# Patient Record
Sex: Female | Born: 1970
Health system: Southern US, Community
[De-identification: ages and names within clinical notes are randomized; demographics above are authoritative.]

## PROBLEM LIST (undated history)

## (undated) DIAGNOSIS — F32A Depression, unspecified: Secondary | ICD-10-CM

## (undated) DIAGNOSIS — I5189 Other ill-defined heart diseases: Secondary | ICD-10-CM

## (undated) DIAGNOSIS — Z87442 Personal history of urinary calculi: Secondary | ICD-10-CM

## (undated) DIAGNOSIS — G8929 Other chronic pain: Secondary | ICD-10-CM

## (undated) DIAGNOSIS — T7840XA Allergy, unspecified, initial encounter: Secondary | ICD-10-CM

## (undated) DIAGNOSIS — K219 Gastro-esophageal reflux disease without esophagitis: Secondary | ICD-10-CM

## (undated) DIAGNOSIS — Z87891 Personal history of nicotine dependence: Secondary | ICD-10-CM

## (undated) DIAGNOSIS — R51 Headache: Secondary | ICD-10-CM

## (undated) DIAGNOSIS — E785 Hyperlipidemia, unspecified: Secondary | ICD-10-CM

## (undated) DIAGNOSIS — F329 Major depressive disorder, single episode, unspecified: Secondary | ICD-10-CM

## (undated) DIAGNOSIS — I447 Left bundle-branch block, unspecified: Secondary | ICD-10-CM

## (undated) DIAGNOSIS — I519 Heart disease, unspecified: Secondary | ICD-10-CM

## (undated) DIAGNOSIS — E669 Obesity, unspecified: Secondary | ICD-10-CM

## (undated) DIAGNOSIS — R519 Headache, unspecified: Secondary | ICD-10-CM

## (undated) HISTORY — DX: Major depressive disorder, single episode, unspecified: F32.9

## (undated) HISTORY — DX: Other chronic pain: G89.29

## (undated) HISTORY — DX: Headache: R51

## (undated) HISTORY — DX: Allergy, unspecified, initial encounter: T78.40XA

## (undated) HISTORY — DX: Other ill-defined heart diseases: I51.89

## (undated) HISTORY — DX: Obesity, unspecified: E66.9

## (undated) HISTORY — DX: Left bundle-branch block, unspecified: I44.7

## (undated) HISTORY — DX: Depression, unspecified: F32.A

## (undated) HISTORY — DX: Heart disease, unspecified: I51.9

## (undated) HISTORY — DX: Headache, unspecified: R51.9

## (undated) HISTORY — DX: Hyperlipidemia, unspecified: E78.5

## (undated) HISTORY — DX: Personal history of nicotine dependence: Z87.891

---

## 1998-11-10 ENCOUNTER — Ambulatory Visit (HOSPITAL_COMMUNITY): Admission: RE | Admit: 1998-11-10 | Discharge: 1998-11-10 | Payer: Self-pay | Admitting: Orthopedic Surgery

## 1998-11-10 ENCOUNTER — Encounter: Payer: Self-pay | Admitting: Orthopedic Surgery

## 1998-12-03 ENCOUNTER — Ambulatory Visit (HOSPITAL_BASED_OUTPATIENT_CLINIC_OR_DEPARTMENT_OTHER): Admission: RE | Admit: 1998-12-03 | Discharge: 1998-12-03 | Payer: Self-pay | Admitting: Orthopedic Surgery

## 1999-04-26 ENCOUNTER — Other Ambulatory Visit: Admission: RE | Admit: 1999-04-26 | Discharge: 1999-04-26 | Payer: Self-pay | Admitting: Obstetrics and Gynecology

## 1999-10-19 ENCOUNTER — Ambulatory Visit (HOSPITAL_COMMUNITY): Admission: RE | Admit: 1999-10-19 | Discharge: 1999-10-19 | Payer: Self-pay | Admitting: Obstetrics and Gynecology

## 1999-10-19 ENCOUNTER — Encounter: Payer: Self-pay | Admitting: Obstetrics and Gynecology

## 1999-11-16 ENCOUNTER — Ambulatory Visit (HOSPITAL_COMMUNITY): Admission: RE | Admit: 1999-11-16 | Discharge: 1999-11-16 | Payer: Self-pay | Admitting: Obstetrics and Gynecology

## 1999-11-16 ENCOUNTER — Encounter: Payer: Self-pay | Admitting: Obstetrics and Gynecology

## 1999-12-26 ENCOUNTER — Ambulatory Visit (HOSPITAL_COMMUNITY): Admission: RE | Admit: 1999-12-26 | Discharge: 1999-12-26 | Payer: Self-pay | Admitting: Obstetrics and Gynecology

## 1999-12-28 ENCOUNTER — Ambulatory Visit (HOSPITAL_COMMUNITY): Admission: RE | Admit: 1999-12-28 | Discharge: 1999-12-28 | Payer: Self-pay | Admitting: Obstetrics and Gynecology

## 2000-02-27 ENCOUNTER — Inpatient Hospital Stay (HOSPITAL_COMMUNITY): Admission: AD | Admit: 2000-02-27 | Discharge: 2000-02-27 | Payer: Self-pay | Admitting: Obstetrics and Gynecology

## 2000-02-28 ENCOUNTER — Inpatient Hospital Stay (HOSPITAL_COMMUNITY): Admission: AD | Admit: 2000-02-28 | Discharge: 2000-02-28 | Payer: Self-pay | Admitting: Obstetrics and Gynecology

## 2000-03-06 ENCOUNTER — Inpatient Hospital Stay (HOSPITAL_COMMUNITY): Admission: AD | Admit: 2000-03-06 | Discharge: 2000-03-08 | Payer: Self-pay | Admitting: Obstetrics and Gynecology

## 2001-04-10 ENCOUNTER — Other Ambulatory Visit: Admission: RE | Admit: 2001-04-10 | Discharge: 2001-04-10 | Payer: Self-pay | Admitting: Obstetrics and Gynecology

## 2001-06-07 ENCOUNTER — Encounter: Payer: Self-pay | Admitting: Obstetrics and Gynecology

## 2001-06-07 ENCOUNTER — Ambulatory Visit (HOSPITAL_COMMUNITY): Admission: RE | Admit: 2001-06-07 | Discharge: 2001-06-07 | Payer: Self-pay | Admitting: Obstetrics and Gynecology

## 2001-08-19 ENCOUNTER — Inpatient Hospital Stay (HOSPITAL_COMMUNITY): Admission: AD | Admit: 2001-08-19 | Discharge: 2001-08-19 | Payer: Self-pay | Admitting: Obstetrics and Gynecology

## 2001-08-20 ENCOUNTER — Inpatient Hospital Stay (HOSPITAL_COMMUNITY): Admission: AD | Admit: 2001-08-20 | Discharge: 2001-08-20 | Payer: Self-pay | Admitting: Obstetrics and Gynecology

## 2001-08-29 ENCOUNTER — Inpatient Hospital Stay (HOSPITAL_COMMUNITY): Admission: AD | Admit: 2001-08-29 | Discharge: 2001-08-29 | Payer: Self-pay | Admitting: Obstetrics and Gynecology

## 2001-09-23 ENCOUNTER — Inpatient Hospital Stay (HOSPITAL_COMMUNITY): Admission: AD | Admit: 2001-09-23 | Discharge: 2001-09-23 | Payer: Self-pay | Admitting: Obstetrics and Gynecology

## 2001-10-10 ENCOUNTER — Ambulatory Visit (HOSPITAL_COMMUNITY): Admission: RE | Admit: 2001-10-10 | Discharge: 2001-10-10 | Payer: Self-pay | Admitting: Obstetrics and Gynecology

## 2001-10-10 ENCOUNTER — Encounter: Payer: Self-pay | Admitting: Obstetrics and Gynecology

## 2001-10-11 ENCOUNTER — Inpatient Hospital Stay (HOSPITAL_COMMUNITY): Admission: AD | Admit: 2001-10-11 | Discharge: 2001-10-13 | Payer: Self-pay | Admitting: Obstetrics and Gynecology

## 2001-11-30 HISTORY — PX: WRIST SURGERY: SHX841

## 2002-05-12 ENCOUNTER — Other Ambulatory Visit: Admission: RE | Admit: 2002-05-12 | Discharge: 2002-05-12 | Payer: Self-pay | Admitting: Obstetrics and Gynecology

## 2002-12-04 ENCOUNTER — Encounter: Admission: RE | Admit: 2002-12-04 | Discharge: 2002-12-04 | Payer: Self-pay | Admitting: Orthopaedic Surgery

## 2003-09-10 ENCOUNTER — Encounter: Admission: RE | Admit: 2003-09-10 | Discharge: 2003-09-10 | Payer: Self-pay | Admitting: Orthopaedic Surgery

## 2003-10-23 ENCOUNTER — Other Ambulatory Visit: Admission: RE | Admit: 2003-10-23 | Discharge: 2003-10-23 | Payer: Self-pay | Admitting: Obstetrics and Gynecology

## 2003-12-14 ENCOUNTER — Ambulatory Visit: Payer: Self-pay | Admitting: Family Medicine

## 2004-06-09 ENCOUNTER — Ambulatory Visit: Payer: Self-pay | Admitting: Internal Medicine

## 2005-01-16 ENCOUNTER — Ambulatory Visit: Payer: Self-pay | Admitting: Family Medicine

## 2005-02-07 ENCOUNTER — Ambulatory Visit: Payer: Self-pay | Admitting: Family Medicine

## 2005-10-13 ENCOUNTER — Emergency Department (HOSPITAL_COMMUNITY): Admission: EM | Admit: 2005-10-13 | Discharge: 2005-10-14 | Payer: Self-pay | Admitting: Emergency Medicine

## 2005-10-23 ENCOUNTER — Ambulatory Visit: Payer: Self-pay | Admitting: Gastroenterology

## 2005-11-01 ENCOUNTER — Ambulatory Visit: Payer: Self-pay | Admitting: Gastroenterology

## 2005-11-01 ENCOUNTER — Encounter (INDEPENDENT_AMBULATORY_CARE_PROVIDER_SITE_OTHER): Payer: Self-pay | Admitting: Specialist

## 2005-12-30 HISTORY — PX: FOOT SURGERY: SHX648

## 2006-01-18 ENCOUNTER — Ambulatory Visit: Payer: Self-pay | Admitting: Family Medicine

## 2006-04-13 ENCOUNTER — Ambulatory Visit: Payer: Self-pay | Admitting: Family Medicine

## 2006-06-29 ENCOUNTER — Telehealth: Payer: Self-pay | Admitting: Family Medicine

## 2006-06-29 ENCOUNTER — Emergency Department (HOSPITAL_COMMUNITY): Admission: EM | Admit: 2006-06-29 | Discharge: 2006-06-29 | Payer: Self-pay | Admitting: Emergency Medicine

## 2006-07-02 ENCOUNTER — Telehealth: Payer: Self-pay | Admitting: Family Medicine

## 2006-07-03 ENCOUNTER — Ambulatory Visit: Payer: Self-pay | Admitting: Gastroenterology

## 2006-07-06 ENCOUNTER — Ambulatory Visit (HOSPITAL_COMMUNITY): Admission: RE | Admit: 2006-07-06 | Discharge: 2006-07-06 | Payer: Self-pay | Admitting: Gastroenterology

## 2006-07-12 ENCOUNTER — Encounter: Payer: Self-pay | Admitting: Family Medicine

## 2006-07-18 ENCOUNTER — Encounter (INDEPENDENT_AMBULATORY_CARE_PROVIDER_SITE_OTHER): Payer: Self-pay | Admitting: Surgery

## 2006-07-18 ENCOUNTER — Ambulatory Visit (HOSPITAL_COMMUNITY): Admission: RE | Admit: 2006-07-18 | Discharge: 2006-07-19 | Payer: Self-pay | Admitting: Surgery

## 2006-07-19 ENCOUNTER — Encounter: Payer: Self-pay | Admitting: Family Medicine

## 2007-01-31 HISTORY — PX: CHOLECYSTECTOMY: SHX55

## 2007-03-25 ENCOUNTER — Ambulatory Visit: Payer: Self-pay | Admitting: Family Medicine

## 2007-03-25 DIAGNOSIS — L259 Unspecified contact dermatitis, unspecified cause: Secondary | ICD-10-CM | POA: Insufficient documentation

## 2007-03-25 DIAGNOSIS — K828 Other specified diseases of gallbladder: Secondary | ICD-10-CM | POA: Insufficient documentation

## 2007-03-25 DIAGNOSIS — E7800 Pure hypercholesterolemia, unspecified: Secondary | ICD-10-CM | POA: Insufficient documentation

## 2007-03-25 LAB — CONVERTED CEMR LAB: Rapid Strep: NEGATIVE

## 2007-03-27 ENCOUNTER — Encounter: Payer: Self-pay | Admitting: Family Medicine

## 2007-03-27 ENCOUNTER — Telehealth: Payer: Self-pay | Admitting: Family Medicine

## 2007-03-27 LAB — CONVERTED CEMR LAB
Bilirubin, Direct: 0.1 mg/dL (ref 0.0–0.3)
Cholesterol: 204 mg/dL (ref 0–200)
HDL: 60.8 mg/dL (ref >39.0)
Total Bilirubin: 0.8 mg/dL (ref 0.3–1.2)
Total CHOL/HDL Ratio: 3.4
Total Protein: 6.9 g/dL (ref 6.0–8.3)
Triglycerides: 87 mg/dL (ref 0–149)

## 2007-06-22 DIAGNOSIS — R519 Headache, unspecified: Secondary | ICD-10-CM | POA: Insufficient documentation

## 2007-06-22 DIAGNOSIS — Z87442 Personal history of urinary calculi: Secondary | ICD-10-CM | POA: Insufficient documentation

## 2007-06-22 DIAGNOSIS — E785 Hyperlipidemia, unspecified: Secondary | ICD-10-CM | POA: Insufficient documentation

## 2007-06-22 DIAGNOSIS — R51 Headache: Secondary | ICD-10-CM | POA: Insufficient documentation

## 2008-10-20 ENCOUNTER — Telehealth: Payer: Self-pay | Admitting: Gastroenterology

## 2008-10-20 ENCOUNTER — Ambulatory Visit: Payer: Self-pay | Admitting: Gastroenterology

## 2008-10-20 DIAGNOSIS — F329 Major depressive disorder, single episode, unspecified: Secondary | ICD-10-CM | POA: Insufficient documentation

## 2008-10-29 LAB — CONVERTED CEMR LAB
AST: 20 units/L (ref 0–37)
Albumin: 4 g/dL (ref 3.5–5.2)
Alkaline Phosphatase: 73 units/L (ref 39–117)
BUN: 10 mg/dL (ref 6–23)
Basophils Relative: 5.7 % — ABNORMAL HIGH (ref 0.0–3.0)
Calcium: 9.3 mg/dL (ref 8.4–10.5)
Chloride: 106 meq/L (ref 96–112)
Eosinophils Absolute: 0.1 10*3/uL (ref 0.0–0.7)
Eosinophils Relative: 2.1 % (ref 0.0–5.0)
Lymphocytes Relative: 24.2 % (ref 12.0–46.0)
MCV: 91.5 fL (ref 78.0–100.0)
Monocytes Absolute: 0.4 10*3/uL (ref 0.1–1.0)
Neutrophils Relative %: 61.4 % (ref 43.0–77.0)
Platelets: 297 10*3/uL (ref 150.0–400.0)
Potassium: 3.9 meq/L (ref 3.5–5.1)
RBC: 4.61 M/uL (ref 3.87–5.11)
Sodium: 142 meq/L (ref 135–145)
Total Protein: 7.6 g/dL (ref 6.0–8.3)
WBC: 6.7 10*3/uL (ref 4.5–10.5)

## 2009-01-06 ENCOUNTER — Telehealth: Payer: Self-pay | Admitting: Gastroenterology

## 2009-01-11 ENCOUNTER — Encounter (INDEPENDENT_AMBULATORY_CARE_PROVIDER_SITE_OTHER): Payer: Self-pay | Admitting: *Deleted

## 2009-01-11 ENCOUNTER — Ambulatory Visit: Payer: Self-pay | Admitting: Gastroenterology

## 2009-01-11 DIAGNOSIS — R634 Abnormal weight loss: Secondary | ICD-10-CM | POA: Insufficient documentation

## 2009-01-19 ENCOUNTER — Ambulatory Visit: Payer: Self-pay | Admitting: Gastroenterology

## 2009-01-20 ENCOUNTER — Telehealth (INDEPENDENT_AMBULATORY_CARE_PROVIDER_SITE_OTHER): Payer: Self-pay | Admitting: *Deleted

## 2009-01-21 ENCOUNTER — Ambulatory Visit (HOSPITAL_COMMUNITY): Admission: RE | Admit: 2009-01-21 | Discharge: 2009-01-21 | Payer: Self-pay | Admitting: Gastroenterology

## 2009-01-25 ENCOUNTER — Encounter: Payer: Self-pay | Admitting: Gastroenterology

## 2009-01-26 ENCOUNTER — Encounter: Payer: Self-pay | Admitting: Gastroenterology

## 2009-02-05 ENCOUNTER — Encounter: Payer: Self-pay | Admitting: Gastroenterology

## 2009-04-12 ENCOUNTER — Encounter: Payer: Self-pay | Admitting: Gastroenterology

## 2010-02-16 ENCOUNTER — Encounter: Payer: Self-pay | Admitting: Family Medicine

## 2010-03-01 NOTE — Consult Note (Signed)
Summary: [facility]  [facility]   Imported By: Lester  04/19/2009 08:52:49  _____________________________________________________________________  External Attachment:    Type:   Image     Comment:   External Document

## 2010-03-01 NOTE — Consult Note (Signed)
Summary: [facility]   Imported By: Sherian Rein 02/11/2009 09:22:04  _____________________________________________________________________  External Attachment:    Type:   Image     Comment:   External Document

## 2010-03-02 HISTORY — PX: TONSILLECTOMY: SUR1361

## 2010-03-03 NOTE — Consult Note (Signed)
Summary: [facility]  [facility]   Imported By: Maryln Gottron 02/22/2010 11:09:18  _____________________________________________________________________  External Attachment:    Type:   Image     Comment:   External Document

## 2010-03-16 ENCOUNTER — Encounter: Payer: Self-pay | Admitting: Family Medicine

## 2010-03-16 ENCOUNTER — Ambulatory Visit (INDEPENDENT_AMBULATORY_CARE_PROVIDER_SITE_OTHER): Payer: BC Managed Care – PPO | Admitting: Family Medicine

## 2010-03-16 DIAGNOSIS — J019 Acute sinusitis, unspecified: Secondary | ICD-10-CM

## 2010-03-28 ENCOUNTER — Other Ambulatory Visit: Payer: Self-pay | Admitting: Otolaryngology

## 2010-03-28 ENCOUNTER — Ambulatory Visit (HOSPITAL_BASED_OUTPATIENT_CLINIC_OR_DEPARTMENT_OTHER)
Admission: RE | Admit: 2010-03-28 | Discharge: 2010-03-29 | Disposition: A | Discharge status: Home / Self Care | Payer: BC Managed Care – PPO | Source: Ambulatory Visit | Attending: Otolaryngology | Admitting: Otolaryngology

## 2010-03-28 DIAGNOSIS — F3289 Other specified depressive episodes: Secondary | ICD-10-CM | POA: Insufficient documentation

## 2010-03-28 DIAGNOSIS — F329 Major depressive disorder, single episode, unspecified: Secondary | ICD-10-CM | POA: Insufficient documentation

## 2010-03-28 DIAGNOSIS — J3501 Chronic tonsillitis: Secondary | ICD-10-CM | POA: Insufficient documentation

## 2010-03-29 NOTE — Op Note (Signed)
  Kendra [name], Kendra [name]             ACCOUNT NO.:  [id number]  MEDICAL RECORD NO.:  [id number]            PATIENT TYPE:  LOCATION:                                 FACILITY:  PHYSICIAN:  Illana Nolting H. [name], MD          DATE OF BIRTH:  DATE OF PROCEDURE: DATE OF DISCHARGE:                              OPERATIVE REPORT   REFERRING PHYSICIAN:  Marne A. Tower, MD  PREOPERATIVE DIAGNOSIS:  Chronic tonsillitis.  POSTOPERATIVE DIAGNOSIS:  Chronic tonsillitis.  PROCEDURE:  Tonsillectomy.  SURGEON:  Brantley Naser H. [name], MD  ANESTHESIA:  General endotracheal anesthesia was used.  COMPLICATIONS:  No complications.  ESTIMATED BLOOD LOSS:  Minimal.  FINDINGS:  Large deeply cryptic tonsils with a large white/yellow tonsilliths present on the right side.  HISTORY:  A 40 year old with a history of chronic tonsillitis.  Risks, benefits, alternatives, and complications of the procedure were explained to the patient, seemed to understand, and agreed to surgery.  PROCEDURE:  The patient was taken to the operating room, placed on the operating table in a supine position.  Following induction of general endotracheal anesthesia, the table was turned.  The patient was draped in a standard fashion.  Crowe-Davis mouth gag was inserted into the oral cavity, used to retract the tongue and mandible, and attached to the Mayo stand.  Inspection of the palate revealed no abnormality.  Red rubber catheter was inserted into the right side of the nose, withdrawn through the mouth, and used to retract the soft palate and uvula. Indirect exam of the nasopharynx was performed and there was no adenoidal tissue present.  Tonsillectomy was performed using electrocautery dissection carefully dissecting the avascular plane between the capsule and constrictor muscles. Tonsils were sent together for pathologic evaluation.  Suction cautery was used for completion of hemostasis.  The pharynx was irrigated with saline and  suction.  Orogastric tube was used to aspirate contents of the stomach.  The patient was awakened, extubated, and transferred to recovery in stable condition.     Shamya Macfadden H. [name], MD     JHR/MEDQ  D:  03/28/2010  T:  03/28/2010  Job:  [id number]  cc:   Marne A. Milinda Antis, MD  Electronically Signed by Serena Colonel MD on 03/29/2010 01:30:54 PM

## 2010-03-29 NOTE — Assessment & Plan Note (Signed)
Summary: ?SINUS INFECTION/CLE  [employer]   Vital Signs:  Patient profile:   40 year old female Weight:      193.25 pounds BMI:     27.83 Temp:     98.0 degrees F oral Pulse rate:   74 / minute Pulse rhythm:   regular BP sitting:   136 / 84  (left arm) Cuff size:   large  Vitals Entered By: Selena Batten Dance CMA Duncan Dull) (March 16, 2010 12:50 PM) CC: ? SInus infection   History of Present Illness: symptoms started with a sore throat for about a week with post nasal drip and achey some dizziness and nasal congestion nasal congestion is bad bad facial pain on the right  green discharge  fever at home - chills and sweats   having tonsils out in 2 weeks for chronic sore throat -- 2/27   no n/v/d   Allergies: 1)  ! Penicillin 2)  ! Codeine  Past History:  Past Medical History: Last updated: 03/25/2007 past hx of depression- with medication  Past Surgical History: Last updated: 10/20/2008 CCY 6/08 Wrist surgery (11/2001) Colonoscopy- normal (10/2005) EGD- normal Foot surgery (12/2005) Cholecystectomy  Family History: Last updated: 10/20/2008 mother- chol 2 brothers - chol No FH of Colon Cancer:  Social History: Last updated: 10/20/2008 nonsmoker Occupation: [employer]  Alcohol Use - yes: once a month  Daily Caffeine Use: Sweet tea daily  Illicit Drug Use - no Patient does not get regular exercise.   Risk Factors: Exercise: no (10/20/2008)  Risk Factors: Smoking Status: quit (03/25/2007)  Review of Systems General:  Complains of chills, fatigue, fever, and malaise. Eyes:  Denies blurring, discharge, and eye irritation. ENT:  Complains of earache, nasal congestion, postnasal drainage, sinus pressure, and sore throat. CV:  Denies chest pain or discomfort and palpitations. Resp:  Complains of cough; denies pleuritic and shortness of breath. GI:  Denies abdominal pain, change in bowel habits, indigestion, nausea, and vomiting. Derm:  Denies itching, lesion(s),  poor wound healing, and rash. Neuro:  Denies headaches and tingling. Heme:  Denies abnormal bruising and bleeding.  Physical Exam  General:  Well-developed,well-nourished,in no acute distress; alert,appropriate and cooperative throughout examination Head:  R focal maxillary sinus tenderness normocephalic, atraumatic, and no abnormalities observed.   Eyes:  vision grossly intact, pupils equal, pupils round, pupils reactive to light, and no injection.   Ears:  R ear normal and L ear normal.   Nose:  nares are injected and congested bilaterally  Mouth:  mild post throat inj without exudate or swelling some post nasal drip Neck:  No deformities, masses, or tenderness noted. Lungs:  Normal respiratory effort, chest expands symmetrically. Lungs are clear to auscultation, no crackles or wheezes. Heart:  Normal rate and regular rhythm. S1 and S2 normal without gallop, murmur, click, rub or other extra sounds. Skin:  Intact without suspicious lesions or rashes Cervical Nodes:  No lymphadenopathy noted Psych:  normal affect, talkative and pleasant    Impression & Recommendations:  Problem # 1:  SINUSITIS, ACUTE (ICD-461.9) Assessment New  with uri/ congestion/ focal R facial pain and green nasal d/c tx with levaquin (pcn all)  getting ready for tonsillectomy- hopes to be better by then recommend sympt care- see pt instructions   pt advised to update me if symptoms worsen or do not improve  Her updated medication list for this problem includes:    Levaquin 500 Mg Tabs (Levofloxacin) .Marland Kitchen... 1 by mouth once daily for 10 days for sinus infection  Orders: Prescription Created Electronically [id number])  Complete Medication List: 1)  Wellbutrin 75 Mg Tabs (Bupropion hcl) .... One tablet by mouth two times a day 2)  Levaquin 500 Mg Tabs (Levofloxacin) .Marland Kitchen.. 1 by mouth once daily for 10 days for sinus infection  Patient Instructions: 1)  you can try mucinex over the counter twice daily as directed  and nasal saline spray for congestion (or netti pot) 2)  tylenol over the counter as directed may help with aches, headache and fever 3)  call if symptoms worsen or if not improved in 4-5 days  4)  take the levaquin as directed  5)  get more rest  Prescriptions: LEVAQUIN 500 MG TABS (LEVOFLOXACIN) 1 by mouth once daily for 10 days for sinus infection  #10 x 0   Entered and Authorized by:   Judith Part MD   Signed by:   Judith Part MD on 03/16/2010   Method used:   Electronically to        [facility]* (retail)       [address]       [place], [place]  [address]       Ph: [phone]       Fax: [phone]   RxID:   [id number]    Orders Added: 1)  Prescription Created Electronically [[id number]] 2)  Est. Patient Level III [44010]    Current Allergies (reviewed today): ! PENICILLIN ! CODEINE

## 2010-06-14 NOTE — Assessment & Plan Note (Signed)
[facility]                         GASTROENTEROLOGY OFFICE NOTE   SYRINA, WAKE                    MRN:          [id number]  DATE:07/03/2006                            DOB:          12-25-1970    PRIMARY CARE PHYSICIAN:  Marne A. Tower, M.D.   GI PROBLEM LIST:  1. Intermittent right upper quadrant pain.  Initially evaluated in      September, 2007 by me.  EGD was normal.  CBC was normal.  CMET was      normal.  TSH was normal.  Colonoscopy (done for chronic loose      stools) was also normal.  Ultrasound on September, 2007 showed a      normal gallbladder, normal bile duct.  Patient a no-show for      followup.  Now has a return of right upper quadrant to right back      pain.  Presented to the emergency room last week.  CBC was normal.      Complete metabolic profile was essentially normal.  Ultrasound for      gallbladder was normal as well.  She was told to follow up here.   Her symptoms, she says are very similar to the pain she had previously.  It is a right upper quadrant pain, some tenderness in her right upper  quadrant.  There is a component of some right back pain.  It is nearly  constant, but the pain is definitely worse after eating.  The pain  started shortly after a [employer] meal this past Friday.  She has been  somewhat afraid to eat since then but has been eating cereal without  dramatic worsening of her pain.  She had some mild nausea but no  vomiting.  She does have a history of kidney stones, specifically a  right-sided kidney stone.  She saw a urologist yesterday, who did not  feel this is causing her symptoms.   CURRENT MEDICATIONS:  Wellbutrin and Paxil.   PHYSICAL EXAMINATION:  VITAL SIGNS:  Weight is 202 pounds.  Blood  pressure 134/80, pulse 76.  CONSTITUTIONAL:  Generally well-appearing.  LUNGS: Clear to auscultation bilaterally.  HEART:  Regular rate and rhythm.  ABDOMEN:  Soft.  Mildly tender in the  right upper quadrant.  Positive  Murphy's sign.  EXTREMITIES:  No lower extremity edema.   ASSESSMENT/PLAN:  A 40 year old woman with intermittent right upper  quadrant pain, much worse recently.  She says this is similar to the  episode she had several months ago.  Since that episode, she has been  completely well until acutely this past Friday.  On examination, she has  a Murphy's sign, and I would certainly believe her gallbladder was the  source of her pain were it not for a normal ultrasound and normal liver  tests.  Perhaps her gallbladder does not function well, so I will  arrange for her to have a HIDA scan done in the next 1-2 days.  If that  is normal, my next step would be to repeat the CT scan with IV and oral  contrast, as her  previous CT scan in 2007 was without contrast.  If that  CT scan fails to demonstrate the etiology of her pain, then I  would repeat her upper endoscopy.  It seems unlikely that this is peptic  ulcer disease, given the fact that she takes only rare NSAIDs and an EGD  in September, 2007 was essentially normal.     Rachael Fee, MD  Electronically Signed    DPJ/MedQ  DD: 07/03/2006  DT: 07/04/2006  Job #: [id number]   cc:   Marne A. Milinda Antis, MD

## 2010-06-14 NOTE — Op Note (Signed)
Kendra [name], Kendra [name]             ACCOUNT NO.:  [id number]   MEDICAL RECORD NO.:  [id number]          PATIENT TYPE:  AMB   LOCATION:  SDS                          FACILITY:  [facility]   PHYSICIAN:  Ardeth Sportsman, MD     DATE OF BIRTH:  04/07/1970   DATE OF PROCEDURE:  DATE OF DISCHARGE:                               OPERATIVE REPORT   PRIMARY CARE PHYSICIAN:  Marne A. Tower, M.D.   GASTROENTEROLOGIST:  Rachael Fee, M.D.   UROLOGIST:  Veverly Fells. Vernie Ammons, M.D.   PREOPERATIVE DIAGNOSES:  1. Biliary dyskinesia.  2. Possible chronic cholecystitis.   POSTOPERATIVE DIAGNOSES:  1. Biliary dyskinesia.  2. Possible chronic cholecystitis.   PROCEDURE PERFORMED:  1. Laparoscopic cholecystectomy.  2. Intraoperative cholangiogram.   SURGEON:  Ardeth Sportsman, M.D.   ASSISTANT:  None.   SPECIMENS:  Gallbladder.   DRAINS:  None.   ESTIMATED BLOOD LOSS:  Less than 2 mL.   COMPLICATIONS:  None apparent.   ANESTHESIA:  1. General anesthesia.  2. Local anesthetic and a field block around all port sites.   INDICATION:  Kendra [name] is a very pleasant 40 year old female who has  been struggling with intermittent bouts of abdominal pain with an  extensive workup including urological and gastrointestinal but with a  noted decreased gallbladder ejection fraction with reproduction of  symptoms concerning for biliary dyskinesia.  Given the negative  comprehensive workup, recommendation was made for laparoscopic  cholecystectomy after our discussion of the anatomy and physiology of  hepatobiliary and pancreatic function.   Risks such as stroke, heart attack, deep venous thrombosis, pulmonary  embolism and death were discussed.  Risks such as bleeding, need for  transfusion, wound infection, abscess, injury to other organs, and other  risks were discussed.  Risks of incisions hernia was discussed.  Risk of  bowel duct injury as well as need for operative reconstruction, internal  stenting or external percutaneous drainage.  Other risks were discussed  as well.  Other risks of persistent abdominal pain, arguing that the  gallbladder is not the true etiology of her pain was discussed as well.  Questions were answered, she and her family agreed to proceed.   OPERATIVE FINDINGS:  Normal appearing gallbladder with minimal  gallbladder wall thickening.   DESCRIPTION OF PROCEDURE:  Informed consent was confirmed.  The patient  voided just prior to going to the operating room.  She had sequential  compression devices active during the entire case.  She underwent  general anesthesia without difficulty.  She was positioned supine with  both arms tucked.  Her abdomen was prepped and draped in a sterile  fashion.   Entry was gained in the abdomen.  The patient seated in reverse  Trendelenburg right side up using an optical entry with the 5-mm/0  degree scope.  Capnoperitoneum to 15 mmHg provided good abdominal  insufflation.  There was on evidence of any intraabdominal injury.  Under direct visualization, the 5-mm ports were placed in the right  flank and through the umbilicus.  The 10-mm port was then placed in the  subxiphoid region tunneled  through the falciform ligament.   The gallbladder fundus was grasped and elevated cephalad.  The  peritoneal covering between the gallbladder and liver was freed off in  the anterior-medial and posterior-lateral aspects.  Circumferential  dissection was done to free the proximal half of the gallbladder off the  liver bed and revealed 3 structures going from the gallbladder down to  the porta hepatis.  One was on the anterior medial wall, and pulsatile  consistent with the anterior branch of the cystic artery.  One clip on  the gallbladder side and two clips on the proximal were made.  There was  a smaller posterior branch noted as well and smaller branch on the  posterior lateral wall, again one clip on the gallbladder, and one  clip  slightly proximal were made, and this was transected.  This left one  obvious structure coming from the infundibulum straight down to the  porta hepatis cystic duct.   Clip was made on the infundibulum and partial cyst ductotomy was  performed. A  5-French cholangiogram catheter was placed through a right  subcostal stab incision, flushed and passed into the cystic duct without  any difficulty.  A cholangiogram was run using diluted radiopaque  contrast and continuous fluoroscopy.  Contrast flowed from the side  branch consistent with a cystic duct cannulation.  Contrast flowed into  the main bile duct up the right and left intrahepatic chains, down the  common bile duct, across a somewhat narrow but smooth ampulla into a  normal appearing duodenum consistent with a normal cholangiogram.   Actually the gallbladder freed up from its remaining attachments of the  liver just prior to the cholangiogram.  The cholangiogram catheter was  removed.  Four clips were made in the long cystic duct and cystic duct  transection was completed.  The gallbladder was removed out the  subxiphoid port without any difficulty.  The fascia defect was too small  to allow even my pinky to pass so I felt it did not require any more  aggressive fascial closure.   Irrigation was done on the liver bed and there was excellent hemostasis.  Clips were nice and intact on the cystic and arterial duct stumps.  The  upper 3 abdominal ports were removed.  There was no evidence of any  bleeding on the skin or peritoneal site.  Capnoperitoneum was evacuated.  Skin was closed using 4-0 Monocryl and a sterile dressing was applied.   The patient was extubated and sent to recovery room in stable condition.   I am about to explain the operative findings to the patient's family.  I  discussed the postoperative instructions to the patient just prior to  surgery and with the family just after surgery.  Questions were  answered  and they expressed understanding and appreciation.      Ardeth Sportsman, MD  Electronically Signed     SCG/MEDQ  D:  07/18/2006  T:  07/18/2006  Job:  [id number]   cc:   Marne A. Milinda Antis, MD  Rachael Fee, MD  Veverly Fells. Vernie Ammons, M.D.

## 2010-06-17 NOTE — Discharge Summary (Signed)
NAME:  ZALEY, TALLEY                       ACCOUNT NO.:  [id number]   MEDICAL RECORD NO.:  [id number]                   PATIENT TYPE:  INP   LOCATION:  9143                                 FACILITY:  [facility]   PHYSICIAN:  Zenaida Niece, M.D.             DATE OF BIRTH:  February 26, 1970   DATE OF ADMISSION:  10/11/2001  DATE OF DISCHARGE:  10/13/2001                                 DISCHARGE SUMMARY   ADMISSION DIAGNOSIS:  Intrauterine pregnancy at 36 weeks, Group B strep  carrier.   DISCHARGE DIAGNOSIS:  Intrauterine pregnancy at 36 weeks, Group B strep  carrier.   PROCEDURE:  Spontaneous vaginal delivery.   HISTORY AND PHYSICAL:  This is a 40 year old white female, gravida 2, para  1, 0-0-1, with an EGA of 36+ weeks by seven-week ultrasound with a due date  of October 6th who presents for elective induction with mature fetal lungs  studies by amniocentesis.  The patient is miserable with pre-term  contractions and requested an induction.  Amniocentesis done on September  11th had an FLM of 34 with greater than 55, being consistent with fetal  maturity.  She has had good fetal movement, no vaginal bleeding or ruptured  membranes.  Prenatal care complicated by pre-term contractions treated with  terbutaline and Procardia.  She did have a positive fetal fibronectin at 32  weeks.  She has had mild pregnancy-induced hypertension with normal labs.   PRENATAL LABORATORY VALUES:  Blood type is A negative with a negative  antibody screen.  RPR nonreactive.  Rubella immune.  Hepatitis-B surface  antigen negative.  Gonorrhea and Chlamydia negative.  Triple screen normal.  One-hour Glucola 112.  Group B strep is positive.   PAST OBSTETRIC HISTORY:  February 2002, she had a vaginal delivery at 38  weeks, 7 pounds.  She was induced for PIH.   GYNECOLOGIC HISTORY:  Cryotherapy in 1994 with normal followup.   SURGICAL HISTORY:  Left wrist surgery in 2000.   ALLERGIES:  PENICILLIN and  CODEINE.   PHYSICAL EXAMINATION:  VITAL SIGNS:  She is afebrile with stable vital  signs.  Blood pressure 120/80.  Fetal heart tracing reactive.  ABDOMEN:  Gravida, nontender with __________ weight of 6.5 pounds.  VAGINAL EXAM:  378 minus 1 and minus 2 with a vertex presentation and an  adequate pelvis.   HOSPITAL COURSE:  The patient was admitted and initially started on Pitocin  for induction.  On my first exam as above, amniotomy was performed which  revealed clear fluid.  She was on clindamycin for Group B strep prophylaxis.  She progressed in active labor and received an epidural.  She then  progressed to complete and push well.  On the morning of September 12, she  had a vaginal delivery of a viable female and fit with Apgars of 8 and 9,  weight 7 pounds 10 ounces, over a second-degree midline episiotomy.  Nuchal  cord x2 was reduced.  Placenta delivered spontaneous and was intact.  Second-  degree midline episiotomy was repaired with 3-0 Vicryl.  She had a left  vaginal laceration which was hemostatic and not repaired.  Estimated blood  loss was less than 500 cc.  Postpartum she did very well and breastfed her  baby without complications and remained afebrile.  On the morning of  postpartum day #2, she was felt to be stable enough for discharge home.   DISCHARGE INSTRUCTIONS:  Diet is regular diet.  Activities:  Pelvic rest.  Followup is in 4-6 weeks.  Medications are over-the-counter Motrin p.r.n.  and she was given our discharge pamphlet.                                               Zenaida Niece, M.D.    TDM/MEDQ  D:  10/13/2001  T:  10/13/2001  Job:  [id number]

## 2010-06-17 NOTE — Discharge Summary (Signed)
[facility]  Patient:    Kendra [name], Kendra [name]                    MRN: [id number] Adm. Date:  [id number] Disc. Date: 03/08/00 Attending:  Michaele Offer                           Discharge Summary  DISCHARGE DIAGNOSES:          1. Term pregnancy at 38 weeks, delivered.                               2. Pregnancy induced hypertension.                               3. Group B streptococcus carrier.                               4. Status post normal spontaneous vaginal                                  delivery.  DISCHARGE MEDICATIONS:        1. Motrin 600 mg p.o. every six hours.                               2. Percocet one to two tablets p.o. every four                                  hours p.r.n.  HOSPITAL FOLLOWUP:            The patient is to follow up in six weeks for her routine postpartum exam.  HOSPITAL COURSE:              The patient is a 40 year old, G1, P0, who was was admitted at 38 weeks by her last menstrual period, consistent with a 18-week ultrasound. She presented for induction given pregnancy induced hypertension with blood pressures noted to be 130s/100s in the office. Prenatal care had been complicated only by the increased blood pressures with PIH labs normal and Rh negative status, for which she received RhoGAM.  PRENATAL LABORATORIES:        A negative. RPR nonreactive. Rubella immune. Hepatitis B surface antigen negative. GC negative. Chlamydia negative. AFP normal. GBS positive. She did have a positive one-hour glucola; however, her three-hour was within normal limits.  PAST GYNECOLOGIC HISTORY:     In 1994 she had cryosurgery with Pap smears following within normal limits.  PAST MEDICAL HISTORY:         Questionable history of a spastic colon.  PAST SURGICAL HISTORY:        Left wrist surgery.  ALLERGIES:                    PENICILLIN and CODEINE.  SOCIAL HISTORY:               She is married and quit smoking  with pregnancy.  PHYSICAL EXAMINATION ON ADMISSION:                 Blood  pressure is 120-140/70-100. Fetal heart rate was reactive with no decelerations and she was begun on Pitocin. Abdomen is gravid. EFW 7-1/2  pounds. Cervix was 1/90/-1.  The patient had assisted rupture of membranes with clear fluid. PIH labs were repeated and normal except for a uric acid of 5.7, which was slightly elevated. She was placed on clindamycin for GBS prophylaxis and continued on Pitocin. The patient received an epidural at 3 cm dilated and progressed quickly to complete. She pushed well; however, became fatigued and requested assisted delivery. A vacuum was attempted but unable to get an adequate seal. Therefore, the patient proceeded to push and deliver spontaneously. There was a normal spontaneous delivery of a viable female infant. Apgars were 8 and 9. Weight was 7 pounds even. There was a second-degree midline episiotomy with a local block. There was a loose nuchal cord x 1, reduced. The placenta was delivered spontaneous. Second-degree midline episiotomy was repaired with a 3-0 Vicryl. Cervix and rectum were intact and the estimated blood loss was less than 500 cc. The patient was then admitted for routine postpartum care and did well.  On postpartum day #2 she was afebrile with stable vital signs. Her pain was well controlled. She had normal lochia and was breast-feeding. The patient was felt stable for discharge and was discharged to home with her follow up and medications as previously stated. DD:  03/08/00 TD:  03/09/00 Job: [id number]

## 2010-06-17 NOTE — Assessment & Plan Note (Signed)
[facility]                           GASTROENTEROLOGY OFFICE NOTE   THEODOSIA, BAHENA                    MRN:          [id number]  DATE:10/23/2005                            DOB:          [birth year]    REASON FOR REFERRAL:  Dr. Milinda Antis asked me to evaluate Ms. Jenifer in  consultation regarding subacute abdominal pain.   HISTORY OF PRESENT ILLNESS:  Ms. Brusseau is a very pleasant 40 year old  woman who noticed acute onset of abdominal discomfort beginning  approximately 10 days ago.  It started with bloating in her abdomen and then  slowly changed to pain in her midepigastrium that seemed to radiate to her  back causing a gnawing pain in her back.  She had never had pain like this.  The pain got worse and worse.  She eventually presented to urgent care where  x-rays were done and lab tests were done.  X-rays suggested a left-sided  kidney stone.  Labs done at that time showed completely normal basic  metabolic profile and a normal CBC.  She was given a GI cocktail.  Things  did seem to improve.  They attributed her pain to the kidney stone.  A day  or 2 later the bloating and pain in her back and abdomen seemed to stay and  actually got worse, so she re-presented to the emergency room.  At that  point she had a CT scan without IV contrast, which confirmed the stones in  her left kidney.  There was no evidence of hydronephrosis.  She was followed  up with a urologist, who did not think these stones were at all causing her  discomfort.  She comes in now after 10 days of her symptoms.  The pain is  somewhat improved.  She has been taking Advil 3 pills twice a day and Ultram  periodically.  She had very mild nausea, but no vomiting.  She does have  diarrhea.  This has been going on for many years for her.  She has what  sounds like a brisk gastrocolic reflex moving her bowels very loose at least  3-4 times a day.  She does describe a small bit of  blood on the tissue paper  with just about every bowel movement.  She did not think much of this  because it has been going on for such a long time.   REVIEW OF SYSTEMS:  Notable for recent weight loss followed by a 20 pound  weight gain within the past year.  Overall, she is approximately stable for  the past year.  The rest of the review of systems essentially normal and is  available on her nursing intake sheet.   PAST MEDICAL HISTORY:  Elevated cholesterol.  Kidney stones.  Chronic  headaches.   CURRENT MEDICATIONS:  Advil on a p.r.n. basis, which she is needing a lot of  lately.  Paxil.   ALLERGIES:  PENICILLIN and CODEINE.   SOCIAL HISTORY:  Married with 2 sons.  Works at [employer].  Smokes  occasionally.  Drinks very rarely.   FAMILY HISTORY:  No colon cancer,  colon polyps in family.  No pancreatic  disease in family.   PHYSICAL EXAM:  Height 5 feet, 10 inches, 204 pounds.  Blood pressure  122/86, pulse 60.  CONSTITUTIONAL:  Generally well appearing.  NEUROLOGIC:  Alert and oriented x3.  EYES:  Extraocular muscles are intact.  MOUTH:  Oropharynx moist.  No lesions.  NECK:  Supple.  No lymphadenopathy.  CARDIOVASCULAR:  Heart regular rate and rhythm.  LUNGS:  Clear to auscultation bilaterally.  ABDOMEN:  Soft.  Very mildly tender in the upper quadrants.  Nondistended.  Normal bowel sounds.  EXTREMITIES:  No lower extremity edema.  SKIN:  No rash or lesions on the visible extremities.   ASSESSMENT AND PLAN:  A 40 year old woman with subacute abdominal pain,  chronic diarrhea with nearly daily blood on tissue paper.   First, it is not clear that her chronic diarrhea is related to her subacute  abdominal pains, but I believe both should be worked up.  I agree that her  pains are unlikely related to those kidney stones.  She may have peptic  ulcer disease, gastritis.  There is some component of back pain, which  raises the question of pancreatic disease, like pancreatitis.   The CT scan  that she had done was suboptimal in terms of evaluating the pancreas and  gallbladder.  I have arranged for her to have an ultrasound done for further  evaluation.  I am also going to repeat lab tests including a CMP, CBC,  thyroid studies, TTG, amylase and lipase.  Have advised her to stay off of  dairy for the time-being.  Perhaps she has a component of lactose  intolerance causing some of her diarrhea.  To further workup her diarrhea,  she will get stool studies done and I am arranging for her to have a  colonoscopy, as well as upper endoscopy at her soonest convenience.                                   Rachael Fee, MD   DPJ/MedQ  DD:  10/23/2005  DT:  10/24/2005  Job #:  [id number]   cc:   Marne A. Milinda Antis, MD  Courtney Paris, M.D.

## 2010-09-07 ENCOUNTER — Encounter: Payer: Self-pay | Admitting: Family Medicine

## 2010-09-10 ENCOUNTER — Telehealth: Payer: Self-pay | Admitting: Family Medicine

## 2010-09-10 DIAGNOSIS — E7800 Pure hypercholesterolemia, unspecified: Secondary | ICD-10-CM

## 2010-09-10 DIAGNOSIS — Z0000 Encounter for general adult medical examination without abnormal findings: Secondary | ICD-10-CM | POA: Insufficient documentation

## 2010-09-10 NOTE — Telephone Encounter (Signed)
Message copied by Judy Pimple on Sat Sep 10, 2010  2:45 PM ------      Message from: Baldomero Lamy      Created: Wed Sep 07, 2010  8:56 AM      Regarding: Cpx labs Mon 8/13       Please order  future cpx labs for pt's upcomming lab appt.      Thanks      Rodney Booze

## 2010-09-12 ENCOUNTER — Other Ambulatory Visit (INDEPENDENT_AMBULATORY_CARE_PROVIDER_SITE_OTHER): Payer: BC Managed Care – PPO | Admitting: Family Medicine

## 2010-09-12 DIAGNOSIS — Z0000 Encounter for general adult medical examination without abnormal findings: Secondary | ICD-10-CM

## 2010-09-12 DIAGNOSIS — E7800 Pure hypercholesterolemia, unspecified: Secondary | ICD-10-CM

## 2010-09-12 LAB — CBC WITH DIFFERENTIAL/PLATELET
Basophils Absolute: 0 10*3/uL (ref 0.0–0.1)
Eosinophils Absolute: 0.2 10*3/uL (ref 0.0–0.7)
HCT: 39 % (ref 36.0–46.0)
Hemoglobin: 13 g/dL (ref 12.0–15.0)
Lymphs Abs: 2 10*3/uL (ref 0.7–4.0)
MCHC: 33.4 g/dL (ref 30.0–36.0)
MCV: 91.8 fl (ref 78.0–100.0)
Monocytes Absolute: 0.4 10*3/uL (ref 0.1–1.0)
Neutro Abs: 3.5 10*3/uL (ref 1.4–7.7)
Platelets: 250 10*3/uL (ref 150.0–400.0)
RDW: 13.4 % (ref 11.5–14.6)

## 2010-09-12 LAB — COMPREHENSIVE METABOLIC PANEL
ALT: 25 U/L (ref 0–35)
AST: 21 U/L (ref 0–37)
Alkaline Phosphatase: 52 U/L (ref 39–117)
CO2: 26 mEq/L (ref 19–32)
Creatinine, Ser: 0.8 mg/dL (ref 0.4–1.2)
GFR: 82.35 mL/min (ref >60.00)
Sodium: 136 mEq/L (ref 135–145)
Total Bilirubin: 0.5 mg/dL (ref 0.3–1.2)
Total Protein: 6.8 g/dL (ref 6.0–8.3)

## 2010-09-12 LAB — LIPID PANEL
HDL: 61.9 mg/dL (ref >39.00)
Total CHOL/HDL Ratio: 3
Triglycerides: 77 mg/dL (ref 0.0–149.0)
VLDL: 15.4 mg/dL (ref 0.0–40.0)

## 2010-09-14 ENCOUNTER — Encounter: Payer: Self-pay | Admitting: Family Medicine

## 2010-09-14 ENCOUNTER — Ambulatory Visit (INDEPENDENT_AMBULATORY_CARE_PROVIDER_SITE_OTHER): Payer: BC Managed Care – PPO | Admitting: Family Medicine

## 2010-09-14 DIAGNOSIS — F3289 Other specified depressive episodes: Secondary | ICD-10-CM

## 2010-09-14 DIAGNOSIS — E785 Hyperlipidemia, unspecified: Secondary | ICD-10-CM

## 2010-09-14 DIAGNOSIS — F329 Major depressive disorder, single episode, unspecified: Secondary | ICD-10-CM

## 2010-09-14 DIAGNOSIS — Z0000 Encounter for general adult medical examination without abnormal findings: Secondary | ICD-10-CM

## 2010-09-14 DIAGNOSIS — Z23 Encounter for immunization: Secondary | ICD-10-CM

## 2010-09-14 MED ORDER — BUPROPION HCL ER (SR) 150 MG PO TB12: 150.0000 mg | ORAL_TABLET | Freq: Two times a day (BID) | ORAL | Status: DC

## 2010-09-14 NOTE — Assessment & Plan Note (Signed)
Reviewed health habits including diet and exercise and skin cancer prevention Also reviewed health mt list, fam hx and immunizations  Rev wellness labs in detail 

## 2010-09-14 NOTE — Progress Notes (Signed)
Subjective:    Patient ID: Kendra [name], female    DOB: 1970-06-04, 40 y.o.   MRN: [id number]  HPI Here for annual wellness exam and to review chronic health problems Has been feeling ok in general   Wt is up 4 lb with bmi of 29  Ht is down 1 inch- but measuring device is new   Gets yearly gyn visits-- was sched for June and had to resched it  Pap- is sched next mo  Has IUD and likes it - no mt  No period problems -still has regular periods  Has not had mammogram yet   Has a kidney stone - one in each kidney - quite large Had considered lithotripsy  She has pain occasionally - on and off - but not bad enough to do anything about it  occ has pain in her R shoulderblade  Had ccy  occ pain in LUQ of abdomen - no heartburn or burping or bloating  Has had 2 EGD - and showed GERd - is supposed to take prilosec but she does not    Tdap-- ? Last one was in college  Is interested  Nl colonosc in 10/07 and nl flex 12/10- never found polyps   Depression - is fine overall - wellbutrin helps her moods and anxiety - needs refil on wellbutrin  Gyn used to give to her  Also tends to help her headaches    Hyperlipidemia --LDL calc is 123 Lab Results  Component Value Date   CHOL 200 09/12/2010   CHOL 204* 03/25/2007   Lab Results  Component Value Date   HDL 61.90 09/12/2010   HDL 56.2 03/25/2007   Lab Results  Component Value Date   LDLCALC 123* 09/12/2010   Lab Results  Component Value Date   TRIG 77.0 09/12/2010   TRIG 87 03/25/2007   Lab Results  Component Value Date   CHOLHDL 3 09/12/2010   CHOLHDL 3.4 CALC 03/25/2007   Lab Results  Component Value Date   LDLDIRECT 135.6 03/25/2007   has had higher chol in past  Trying harder with diet in the past few years  Stays away from fried foods   Patient Active Problem List  Diagnoses  . HYPERLIPIDEMIA  . DEPRESSION  . ECZEMA  . WEIGHT LOSS-ABNORMAL  . HEADACHE, CHRONIC  . NEPHROLITHIASIS, HX OF  . Routine general  medical examination at a health care facility   Past Medical History  Diagnosis Date  . Depression   . Hyperlipidemia   . Chronic headache   . Kidney stones   . Former smoker     quit in 46   Past Surgical History  Procedure Date  . Wrist surgery 11/2001  . Foot surgery 12/2005  . Cholecystectomy   . Tonsillectomy 03/2010   History  Substance Use Topics  . Smoking status: Former Smoker    Quit date: 01/31/1991  . Smokeless tobacco: Not on file  . Alcohol Use: Yes   Family History  Problem Relation Age of Onset  . Hyperlipidemia Mother   . Hyperlipidemia Brother   . Hyperlipidemia Brother    Allergies  Allergen Reactions  . Codeine     REACTION: hives  . Penicillins     REACTION: hives   No current outpatient prescriptions on file prior to visit.      Review of Systems Review of Systems  Constitutional: Negative for fever, appetite change, fatigue and unexpected weight change.  Eyes: Negative for pain and visual disturbance.  Respiratory: Negative for cough and shortness of breath.   Cardiovascular: Negative.for cp or palpitations   Gastrointestinal: Negative for nausea, diarrhea and constipation.  Genitourinary: Negative for urgency and frequency.  Skin: Negative for pallor. and rash  Neurological: Negative for weakness, light-headedness, numbness and pos for some headaches Hematological: Negative for adenopathy. Does not bruise/bleed easily.  Psychiatric/Behavioral: Negative for dysphoric mood when she is on her medicine. The patient is not nervous/anxious.          Objective:   Physical Exam  Constitutional: She appears well-developed and well-nourished. No distress.  HENT:  Head: Normocephalic and atraumatic.  Right Ear: External ear normal.  Left Ear: External ear normal.  Nose: Nose normal.  Mouth/Throat: Oropharynx is clear and moist.  Eyes: Conjunctivae and EOM are normal. Pupils are equal, round, and reactive to light.  Neck: Normal range of  motion. Neck supple. No JVD present. Carotid bruit is not present. No thyromegaly present.  Cardiovascular: Normal rate, regular rhythm, normal heart sounds and intact distal pulses.   Pulmonary/Chest: Effort normal and breath sounds normal. No respiratory distress. She has no wheezes.  Abdominal: Soft. Bowel sounds are normal. She exhibits no distension and no mass. There is no tenderness.  Musculoskeletal: Normal range of motion. She exhibits no edema and no tenderness.       No cva tenderness  Lymphadenopathy:    She has no cervical adenopathy.  Neurological: She is alert. She has normal reflexes. No cranial nerve deficit. Coordination normal.  Skin: Skin is warm and dry. No rash noted. No erythema. No pallor.  Psychiatric: She has a normal mood and affect.       Cheerful and talkative  Nl eye contact and comm skills          Assessment & Plan:

## 2010-09-14 NOTE — Assessment & Plan Note (Signed)
This has improved wth better diet  Rev lab with pt  Rev goals as well  Enc low sat fat diet and exercise

## 2010-09-14 NOTE — Patient Instructions (Signed)
Keep up the healthy diet and exercise Keep working on low sat fat diet (Avoid red meat/ fried foods/ egg yolks/ fatty breakfast meats/ butter, cheese and high fat dairy/ and shellfish  ) Drink lots of water in the summer  Tdap vaccine today for tetnus and pertussis

## 2010-09-15 NOTE — Assessment & Plan Note (Signed)
Does well with wellbutrin and this also helps with headaches Will refill this

## 2010-09-16 ENCOUNTER — Telehealth: Payer: Self-pay | Admitting: *Deleted

## 2010-09-16 DIAGNOSIS — Z114 Encounter for screening for human immunodeficiency virus [HIV]: Secondary | ICD-10-CM

## 2010-09-16 NOTE — Telephone Encounter (Signed)
I do not know how many days out it would be good or if right tube was used...will have to find out from the lab Also she may have to sign a release to do that When you find out -just let me know if I need to order it -thanks

## 2010-09-16 NOTE — Telephone Encounter (Signed)
Spoke with Camelia Eng and the appropriate tube was not drawn and there is a form to sign. I contacted pt and notified her and she scheduled lab appt for 09/21/10 Wednesday at 9:10am. Please order test. Thank you.

## 2010-09-16 NOTE — Telephone Encounter (Signed)
The patient says she was seen earlier in the week and had some lab tests.  She is asking if she could have an HIV added to this lab draw?  I was not able to reach Terri or Rodney Booze to see if this is even possible because of the lapse of time.  Please advise pt.

## 2010-09-16 NOTE — Telephone Encounter (Signed)
I ordered it for future lab

## 2010-09-21 ENCOUNTER — Other Ambulatory Visit (INDEPENDENT_AMBULATORY_CARE_PROVIDER_SITE_OTHER): Payer: BC Managed Care – PPO

## 2010-09-21 DIAGNOSIS — Z1159 Encounter for screening for other viral diseases: Secondary | ICD-10-CM

## 2010-09-21 DIAGNOSIS — Z114 Encounter for screening for human immunodeficiency virus [HIV]: Secondary | ICD-10-CM

## 2010-09-21 NOTE — Progress Notes (Signed)
Addended by: Alvina Chou on: 09/21/2010 10:39 AM   Modules accepted: Orders

## 2010-09-23 ENCOUNTER — Telehealth: Payer: Self-pay | Admitting: Family Medicine

## 2010-09-28 ENCOUNTER — Other Ambulatory Visit: Payer: Self-pay | Admitting: Dermatology

## 2010-11-16 LAB — CBC
Hemoglobin: 12.9
MCHC: 33.7
Platelets: 317
RDW: 13

## 2011-03-15 ENCOUNTER — Encounter: Payer: Self-pay | Admitting: Internal Medicine

## 2011-03-15 ENCOUNTER — Ambulatory Visit (INDEPENDENT_AMBULATORY_CARE_PROVIDER_SITE_OTHER)
Admission: RE | Admit: 2011-03-15 | Discharge: 2011-03-15 | Disposition: A | Discharge status: Home / Self Care | Payer: 59 | Source: Ambulatory Visit | Attending: Internal Medicine | Admitting: Internal Medicine

## 2011-03-15 ENCOUNTER — Ambulatory Visit (INDEPENDENT_AMBULATORY_CARE_PROVIDER_SITE_OTHER): Payer: 59 | Admitting: Internal Medicine

## 2011-03-15 VITALS — BP 140/90 | HR 88 | Temp 98.3°F | Wt 205.0 lb

## 2011-03-15 DIAGNOSIS — N390 Urinary tract infection, site not specified: Secondary | ICD-10-CM | POA: Insufficient documentation

## 2011-03-15 DIAGNOSIS — N201 Calculus of ureter: Secondary | ICD-10-CM | POA: Insufficient documentation

## 2011-03-15 DIAGNOSIS — R319 Hematuria, unspecified: Secondary | ICD-10-CM

## 2011-03-15 LAB — POCT URINALYSIS DIPSTICK
Nitrite, UA: NEGATIVE
Protein, UA: NEGATIVE
Spec Grav, UA: 1.02
Urobilinogen, UA: NEGATIVE

## 2011-03-15 MED ORDER — HYDROCODONE-ACETAMINOPHEN 5-325 MG PO TABS: 1.0000 | ORAL_TABLET | ORAL | Status: AC | PRN

## 2011-03-15 MED ORDER — CIPROFLOXACIN HCL 500 MG PO TABS: 500.0000 mg | ORAL_TABLET | Freq: Two times a day (BID) | ORAL | Status: AC

## 2011-03-15 NOTE — Progress Notes (Signed)
  Subjective:    Patient ID: Kendra [name], female    DOB: [birth year], 41 y.o.   MRN: [id number]  HPI Has been having pain for 2.5 weeks and worse in last 4 days Left flank and lower abdomen---up to scapula Mostly dull but occ sharp  Some pink, blood, in urine Now with dysuria since yesterday No fever  No N/V Eating is off some  Ibuprofen no help for pain No procedure on stones in past----lithotripsy was suggested  Current Outpatient Prescriptions on File Prior to Visit  Medication Sig Dispense Refill  . Multiple Vitamin (MULTIVITAMIN) tablet Take 1 tablet by mouth daily.          Allergies  Allergen Reactions  . Codeine     REACTION: hives  . Penicillins     REACTION: hives    Past Medical History  Diagnosis Date  . Depression   . Hyperlipidemia   . Chronic headache   . Kidney stones   . Former smoker     quit in 63    Past Surgical History  Procedure Date  . Wrist surgery 11/2001  . Foot surgery 12/2005  . Cholecystectomy   . Tonsillectomy 03/2010    Family History  Problem Relation Age of Onset  . Hyperlipidemia Mother   . Hyperlipidemia Brother   . Hyperlipidemia Brother     History   Social History  . Marital Status: Legally Separated    Spouse Name: N/A    Number of Children: N/A  . Years of Education: N/A   Occupational History  . [employer]    Social History Main Topics  . Smoking status: Former Smoker    Quit date: 01/31/1991  . Smokeless tobacco: Never Used  . Alcohol Use: Yes  . Drug Use: No  . Sexually Active: Not on file   Other Topics Concern  . Not on file   Social History Narrative   Daily caffeine use:  Sweet Tea dailyPatient does not get regular exercise   Review of Systems Recent cold which has improved No cough No SOB     Objective:   Physical Exam  Constitutional: She appears well-developed and well-nourished. No distress.  Abdominal: Soft. She exhibits no mass. There is no tenderness.    Musculoskeletal:       Distinct left CVA tenderness          Assessment & Plan:

## 2011-03-15 NOTE — Assessment & Plan Note (Signed)
Has had symptoms suggestive of change in stone position  Now with secondary infection norco for pain Check CT to be sure no hydronephrosis

## 2011-03-15 NOTE — Patient Instructions (Signed)
Please set up the CT scan

## 2011-03-15 NOTE — Assessment & Plan Note (Signed)
Has dysuria and abnormal urine---hematuria and leukocytes May be secondary to stone Will check culture No systemic symptoms Will treat with cipro for now

## 2011-03-18 LAB — URINE CULTURE: Colony Count: 80000

## 2011-03-21 ENCOUNTER — Telehealth: Payer: Self-pay

## 2011-03-21 NOTE — Telephone Encounter (Signed)
I am puzzled by persistence of pain - though glad her urinary pain is better - I want to see her - please put her in tomorrow (a blocked slot is ok if none are open)

## 2011-03-21 NOTE — Telephone Encounter (Signed)
Pt said the pain is the same at the left side of back under ribcage. The pain upon urination is gone. Pt cannot take Hydrocodone during the day. No fever and no new symptoms. Pt uses [facility] in [place] if pharmacy needed.Please advise.

## 2011-03-21 NOTE — Telephone Encounter (Signed)
Patient was calling regarding her urine culture results and if her antibiotic needed to be changed.  She says she is still in a lot of pain.  She did have the CT scan done and it was clear.  She has the Hydrocodone still, but doesn't really like taking it.  She wanted to know what to do next.

## 2011-03-21 NOTE — Telephone Encounter (Signed)
I reviewed chart - CT ok , and urine cx shows that cipro is a good drug for the uti -- where is she still having pain ? / any change at all? Fever or new symptoms?

## 2011-03-21 NOTE — Telephone Encounter (Signed)
Patient notified as instructed by telephone. Pt scheduled appt tomorrow at 8:15 to see Dr Milinda Antis.

## 2011-03-22 ENCOUNTER — Ambulatory Visit: Payer: 59 | Admitting: Family Medicine

## 2011-03-22 DIAGNOSIS — Z0289 Encounter for other administrative examinations: Secondary | ICD-10-CM

## 2011-03-29 ENCOUNTER — Ambulatory Visit: Payer: 59 | Admitting: Family Medicine

## 2011-04-04 ENCOUNTER — Telehealth: Payer: Self-pay | Admitting: *Deleted

## 2011-04-04 NOTE — Telephone Encounter (Signed)
Call from Dr. Hilario Quarry office at [facility] asking for pt's urine culture results from 03/15/11, results faxed to [phone].

## 2011-04-06 ENCOUNTER — Telehealth: Payer: Self-pay | Admitting: *Deleted

## 2011-04-06 NOTE — Telephone Encounter (Signed)
Patient called back stating that she was given an appt tomorrow with Dr. Sharen Hones and was concerned because she didn't know whether to keep the appt in the office for tomorrow or go to the ER tonight.  I spoke with Dr. Sharen Hones and was advised to tell patient that if her pain is unchanged she can wait to see him in the office tomorrow, but if her pain has changed/worsened then she needs to be seen in the ER today.  Patient stated that her pain hasn't changed and she will come in for her appt tomorrow.

## 2011-04-06 NOTE — Telephone Encounter (Signed)
Patient called in a panic stating that she just left the Urologist [facility]) and was advised that she may possibly have a blood clot.  She stated that the nurse told her that after patient described her symptoms and her pain, they think it could be related to a blood clot.  Patient stated that she is suppose to fly on Wednesday of next week.  I advised patient that Dr. Milinda Antis is out of the office today and will not return until Monday.  Patient stated that she is in pain but doesn't know what to do.  I advised patient that it's best that she does go to the ER to be evaluated to make sure that their is not blood clot, advised patient that she will be evaluated and treated accordingly in the ER.

## 2011-04-06 NOTE — Telephone Encounter (Signed)
Noted. Agree. 1+ mo h/o same pain - will see tomorrow.

## 2011-04-07 ENCOUNTER — Encounter: Payer: Self-pay | Admitting: Family Medicine

## 2011-04-07 ENCOUNTER — Ambulatory Visit (INDEPENDENT_AMBULATORY_CARE_PROVIDER_SITE_OTHER): Payer: 59 | Admitting: Family Medicine

## 2011-04-07 ENCOUNTER — Ambulatory Visit (INDEPENDENT_AMBULATORY_CARE_PROVIDER_SITE_OTHER)
Admission: RE | Admit: 2011-04-07 | Discharge: 2011-04-07 | Disposition: A | Discharge status: Home / Self Care | Payer: 59 | Source: Ambulatory Visit | Attending: Family Medicine | Admitting: Family Medicine

## 2011-04-07 ENCOUNTER — Telehealth: Payer: Self-pay | Admitting: *Deleted

## 2011-04-07 VITALS — BP 150/90 | HR 113 | Temp 98.3°F | Ht 70.0 in | Wt 206.1 lb

## 2011-04-07 DIAGNOSIS — R0781 Pleurodynia: Secondary | ICD-10-CM

## 2011-04-07 DIAGNOSIS — R9431 Abnormal electrocardiogram [ECG] [EKG]: Secondary | ICD-10-CM

## 2011-04-07 DIAGNOSIS — R071 Chest pain on breathing: Secondary | ICD-10-CM

## 2011-04-07 LAB — CBC WITH DIFFERENTIAL/PLATELET
Basophils Relative: 0.7 % (ref 0.0–3.0)
Eosinophils Relative: 1.5 % (ref 0.0–5.0)
Lymphocytes Relative: 32.8 % (ref 12.0–46.0)
MCV: 91 fl (ref 78.0–100.0)
Monocytes Absolute: 0.4 10*3/uL (ref 0.1–1.0)
Monocytes Relative: 5.9 % (ref 3.0–12.0)
Neutrophils Relative %: 59.1 % (ref 43.0–77.0)
Platelets: 317 10*3/uL (ref 150.0–400.0)
RBC: 4.9 Mil/uL (ref 3.87–5.11)
WBC: 7.6 10*3/uL (ref 4.5–10.5)

## 2011-04-07 LAB — BASIC METABOLIC PANEL
Calcium: 9.6 mg/dL (ref 8.4–10.5)
GFR: 84.49 mL/min (ref >60.00)
Glucose, Bld: 100 mg/dL — ABNORMAL HIGH (ref 70–99)
Potassium: 4.5 mEq/L (ref 3.5–5.1)
Sodium: 138 mEq/L (ref 135–145)

## 2011-04-07 LAB — D-DIMER, QUANTITATIVE: D-Dimer, Quant: 0.23 ug/mL-FEU (ref 0.00–0.48)

## 2011-04-07 LAB — TSH: TSH: 0.87 u[IU]/mL (ref 0.35–5.50)

## 2011-04-07 MED ORDER — CYCLOBENZAPRINE HCL 10 MG PO TABS: 10.0000 mg | ORAL_TABLET | Freq: Two times a day (BID) | ORAL | Status: AC | PRN

## 2011-04-07 NOTE — Telephone Encounter (Signed)
Spoke with pt, discussed normal blood work results (normal d dimer and others.) rec trial flexeril over weekend, await [place] results on Tuesday.

## 2011-04-07 NOTE — Patient Instructions (Addendum)
I want to set you up with heart ultrasound - pass by Marion's office to schedule this. Blood work today to check on things - we will be in touch about results and next step. May still try flexeril for muscle pain. [phone]

## 2011-04-07 NOTE — Progress Notes (Signed)
Subjective:    Patient ID: Kendra [name], female    DOB: 1970/09/25, 41 y.o.   MRN: [id number]  HPI CC: f/u uro  41 yo with h/o nephrolithiasis, asxs GERD (controlled on prilosec) presents for f/u after eval at urology.  Anxious because told may have a blood clot.  Seen here 03/16/2011 with concern for kidney stones and UTI, treated with 10d course cipro and sent to urology.  eval at urology - did not think pain was due to kidney stone, wondered about PE, advised to f/u with PCP so here today for this.  CT scan: 9 mm left lower pole nonobstructing renal calculus. Additional adjacent punctate calculus. No right renal calculi. No hydronephrosis.  IUD in place.  O/w stable.  Describes 1 month history of pain that starts under ribcage and radiates to left flank and left shoulder blade.  Currently pain in anterior left chest wall.  Constant dull pain, sometimes sharp, worse with deep breaths.  At its worst 8/10 pain, at its best 5/10, currently 7/10.  Pleuritic anterior chest and shoulder blade as well.  Trouble getting comfortable.  + SOB.  Deep breaths make this worse.  No movements make it worse.  Leaning forward doesn't help.  Laying down makes it some better.  Hydrocodone helps pain but knocks her out.  Trouble with NSAIDs 2/2 upset stomach.  Having business trip next week to [place].  Wants things checked out before trip.  UTI sxs resolved after cipro.  Denies fevers/chills, chest pressure, nausea/vomiting, cough, viral sxs, HA.  No new rashes.  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  Longest drive is 1 1/2 hours at a time.  Has Mirena IUD in place.  No fmhx or personal h/o blood clots.  No smoking.  Medications and allergies reviewed and updated in chart.  Past histories reviewed and updated if relevant as below. Patient Active Problem List  Diagnoses  . HYPERLIPIDEMIA  . DEPRESSION  . ECZEMA  . WEIGHT LOSS-ABNORMAL  . HEADACHE, CHRONIC  . NEPHROLITHIASIS, HX OF  .  Routine general medical examination at a health care facility  . Screening for HIV (human immunodeficiency virus)  . UTI (lower urinary tract infection)  . Ureteral stone   Past Medical History  Diagnosis Date  . Depression   . Hyperlipidemia   . Chronic headache   . Kidney stones   . Former smoker     quit in 9   Past Surgical History  Procedure Date  . Wrist surgery 11/2001  . Foot surgery 12/2005  . Cholecystectomy   . Tonsillectomy 03/2010   History  Substance Use Topics  . Smoking status: Former Smoker    Quit date: 01/31/1991  . Smokeless tobacco: Never Used  . Alcohol Use: Yes   Family History  Problem Relation Age of Onset  . Hyperlipidemia Mother   . Hyperlipidemia Brother   . Hyperlipidemia Brother    Allergies  Allergen Reactions  . Codeine     REACTION: hives  . Penicillins     REACTION: hives   Current Outpatient Prescriptions on File Prior to Visit  Medication Sig Dispense Refill  . Multiple Vitamin (MULTIVITAMIN) tablet Take 1 tablet by mouth daily.         Review of Systems Per HPI    Objective:   Physical Exam  Nursing note and vitals reviewed. Constitutional: She appears well-developed and well-nourished. No distress.  HENT:  Head: Normocephalic and atraumatic.  Mouth/Throat: Oropharynx is clear and moist.  No oropharyngeal exudate.  Eyes: Conjunctivae and EOM are normal. Pupils are equal, round, and reactive to light. No scleral icterus.  Neck: Normal range of motion. Neck supple.  Cardiovascular: Normal rate, regular rhythm, normal heart sounds and intact distal pulses.   No murmur heard. Pulmonary/Chest: Effort normal and breath sounds normal. No respiratory distress. She has no wheezes. She has no rales.  Abdominal: Soft. Bowel sounds are normal. She exhibits no distension. There is tenderness (mild tenderness to deep palpation LUQ). There is CVA tenderness (left sided). There is no rebound and no guarding.  Musculoskeletal: She  exhibits no edema.       Right shoulder: Normal.       Left shoulder: Normal.       Cervical back: Normal.       No midline spine tenderness ++ tender to palpation left thoracic paraspinous mm  Lymphadenopathy:    She has no cervical adenopathy.  Skin: Skin is warm and dry. No rash noted.  Psychiatric: She has a normal mood and affect.       Assessment & Plan:

## 2011-04-07 NOTE — Telephone Encounter (Signed)
Patient has a couple of questions for you from earlier and would like a call back from Dr. Reece Agar

## 2011-04-07 NOTE — Assessment & Plan Note (Addendum)
?   Coming from kidney stone. Pleuritic chest and left back pain, diffuse down to lower abdomen and flank region as well. Checked EKG to eval pericarditis - LBBB!, no old EKG to compare. Check stat D dimer today as well as other blood work. Check CXR - clear on my read, no cardiomegaly. No evidence of CHF on exam today. Given new LBBB - check urgent echo to eval LV fxn, myocarditis? - earliest available is next Tuesday.  I will ask Dr. Mariah Milling to see if we can expedite read. Reproducible to palpation - ?MSK.  treat with flexeril over weekend.  If worsening, to seek urgent care. Pt has trip planned to [facility] on Wednesday, discussed recommendation of postponing.

## 2011-04-10 ENCOUNTER — Ambulatory Visit: Payer: 59 | Admitting: Family Medicine

## 2011-04-11 ENCOUNTER — Other Ambulatory Visit: Payer: Self-pay

## 2011-04-11 ENCOUNTER — Ambulatory Visit (HOSPITAL_COMMUNITY): Payer: 59 | Attending: Cardiovascular Disease

## 2011-04-11 ENCOUNTER — Telehealth: Payer: Self-pay | Admitting: Family Medicine

## 2011-04-11 DIAGNOSIS — R931 Abnormal findings on diagnostic imaging of heart and coronary circulation: Secondary | ICD-10-CM

## 2011-04-11 DIAGNOSIS — I379 Nonrheumatic pulmonary valve disorder, unspecified: Secondary | ICD-10-CM | POA: Insufficient documentation

## 2011-04-11 DIAGNOSIS — R0781 Pleurodynia: Secondary | ICD-10-CM

## 2011-04-11 DIAGNOSIS — R072 Precordial pain: Secondary | ICD-10-CM | POA: Insufficient documentation

## 2011-04-11 DIAGNOSIS — I079 Rheumatic tricuspid valve disease, unspecified: Secondary | ICD-10-CM | POA: Insufficient documentation

## 2011-04-11 DIAGNOSIS — I059 Rheumatic mitral valve disease, unspecified: Secondary | ICD-10-CM | POA: Insufficient documentation

## 2011-04-11 DIAGNOSIS — R9431 Abnormal electrocardiogram [ECG] [EKG]: Secondary | ICD-10-CM

## 2011-04-11 NOTE — Telephone Encounter (Signed)
Pt going on trip to [place].  Please have her f/u with [place] (PCP or myself) if not improving after trip.

## 2011-04-11 NOTE — Telephone Encounter (Signed)
Patient had an echocardiogram done today.  She asked for you to call her with the results when you receive it.

## 2011-04-11 NOTE — Telephone Encounter (Signed)
Touched base with patient.  Flexeril did take edge off pain. Echo - overall normal, normal LV function.  Only abnormality is abnormal septal wall motion.  Unclear implications of this.  Will check with cards. D dimer 0.23 - reassuring (98-99% NPV). ?MSK vs kidney stone.

## 2011-04-12 NOTE — Telephone Encounter (Signed)
Touched base with cards - LBBB not normal, rec further evaluation with myoview.  Will refer to cards when returns.  Please notify pt I'd like [place] to refer to cards for further evaluation of abnormal EKG and echo.  Placed order in chart.

## 2011-04-12 NOTE — Telephone Encounter (Signed)
Patient notified and has appt scheduled with cards. She will follow up with [place] if no better after her trip. Kendra [name] will keep appt with cards as scheduled.

## 2011-05-08 ENCOUNTER — Ambulatory Visit (INDEPENDENT_AMBULATORY_CARE_PROVIDER_SITE_OTHER): Payer: 59 | Admitting: Cardiovascular Disease

## 2011-05-08 ENCOUNTER — Encounter: Payer: Self-pay | Admitting: Cardiovascular Disease

## 2011-05-08 VITALS — BP 120/91 | HR 100 | Ht 70.0 in | Wt 200.4 lb

## 2011-05-08 DIAGNOSIS — I447 Left bundle-branch block, unspecified: Secondary | ICD-10-CM

## 2011-05-08 DIAGNOSIS — R079 Chest pain, unspecified: Secondary | ICD-10-CM

## 2011-05-08 MED ORDER — METOPROLOL TARTRATE 25 MG PO TABS: 25.0000 mg | ORAL_TABLET | Freq: Two times a day (BID) | ORAL | Status: DC

## 2011-05-08 NOTE — Assessment & Plan Note (Signed)
Kendra [name] presents with some atypical episodes of chest pain and was incidentally noted to have a left bundle branch block. Her chest pains are very atypical and I don't think that there due to a cardiac etiology. There is a pleuritic component to it. Her d-dimer was negative.  She's had an echocardiogram which reveals an ejection fraction of around 50-55%. She has mild left ventricular dyssynchrony.  We will get a Lexiscan Myoview on her for further evaluation of this LBBB and to completely exclude coronary artery disease. This will also allow [place] to further quantify her ejection fraction.  We will start her on metoprolol 25 mg twice a day. This will help reduce her heart rate and will also help bring her blood pressure down. I've asked her to decrease her salt intake. Have her start on a good diet, exercise, and weight loss program is a she's had her kidney stone surgery.  We'll get the results of the Myoview study but I suspect that she'll be at low risk for upcoming kidney stone procedure.

## 2011-05-08 NOTE — Progress Notes (Signed)
    Kendra [name] Date of Birth  02-09-1970 [facility]     [place] [facility]  [address]    [address]   [address] [place], [place]  [address]    [place], [place]  [address] [phone]  Fax  [phone]  [phone]  Fax [phone]  Problem List: 1. Left bundle branch block 2. Chest pain 3. Kidney stone  History of Present Illness:  Kendra [name] is a 41 year old female. She presented to her medical Dr. with some upper left-sided abdominal pain/breast pain.  This pain is occasionally pleuritic. She was thought to have a pulmonary illness. A d-dimer was subsequent on and found to be negative the An EKG showed left bundle branch block.   A subsequent echocardiogram revealed dyssynchrony of her septum.  She does not exercise on regular basis. She walks occasionally. She's been in the process of moving. She has noticed that she's a little bit more short of breath moving boxes. She's also noticed some left arm tingling and numbness which radiates down to her fingers.  Current Outpatient Prescriptions on File Prior to Visit  Medication Sig Dispense Refill  . HYDROcodone-acetaminophen (NORCO) 5-325 MG per tablet Take by mouth every 6 (six) hours as needed.       . Multiple Vitamin (MULTIVITAMIN) tablet Take 1 tablet by mouth daily.          Allergies  Allergen Reactions  . Codeine     REACTION: hives  . Penicillins     REACTION: hives    Past Medical History  Diagnosis Date  . Depression   . Hyperlipidemia   . Chronic headache   . Kidney stones   . Former smoker     quit in 89    Past Surgical History  Procedure Date  . Wrist surgery 11/2001  . Foot surgery 12/2005  . Cholecystectomy   . Tonsillectomy 03/2010    History  Smoking status  . Former Smoker  . Quit date: 01/31/1991  Smokeless tobacco  . Never Used    History  Alcohol Use  . Yes    Family History  Problem Relation Age of Onset  . Hyperlipidemia Mother   . Hyperlipidemia  Brother   . Hyperlipidemia Brother     Reviw of Systems:  Reviewed in the HPI.  All other systems are negative.  Physical Exam: Blood pressure 120/91, pulse 100, height 5\' 10"  (1.778 m), weight 200 lb 6.4 oz (90.901 kg). General: Well developed, well nourished, in no acute distress.  Head: Normocephalic, atraumatic, sclera non-icteric, mucus membranes are moist,   Neck: Supple. Carotids are 2 + without bruits. No JVD  Lungs: Clear bilaterally to auscultation.  Heart: regular rate.  normal  S1 S2. No murmurs, gallops or rubs.  Abdomen: Soft, non-tender, non-distended with normal bowel sounds. No hepatomegaly. No rebound/guarding. No masses.  Msk:  Strength and tone are normal  Extremities: No clubbing or cyanosis. No edema.  Distal pedal pulses are 2+ and equal bilaterally.  Neuro: Alert and oriented X 3. Moves all extremities spontaneously.  Psych:  Responds to questions appropriately with a normal affect.  ECG: NSR. LBBB.  Assessment / Plan:

## 2011-05-08 NOTE — Patient Instructions (Signed)
Your physician has requested that you have a lexiscan myoview.. Please follow instruction sheet, as given.  Your physician recommends that you schedule a follow-up appointment in:1  MONTH    Your physician has recommended you make the following change in your medication:   Start metoprolol 25 mg one tablet twice daily 12 hours apart.   DASH Diet The DASH diet stands for "Dietary Approaches to Stop Hypertension." It is a healthy eating plan that has been shown to reduce high blood pressure (hypertension) in as little as 14 days, while also possibly providing other significant health benefits. These other health benefits include reducing the risk of breast cancer after menopause and reducing the risk of type 2 diabetes, heart disease, colon cancer, and stroke. Health benefits also include weight loss and slowing kidney failure in patients with chronic kidney disease.  DIET GUIDELINES  Limit salt (sodium). Your diet should contain less than 1500 mg of sodium daily.   Limit refined or processed carbohydrates. Your diet should include mostly whole grains. Desserts and added sugars should be used sparingly.   Include small amounts of heart-healthy fats. These types of fats include nuts, oils, and tub margarine. Limit saturated and trans fats. These fats have been shown to be harmful in the body.  CHOOSING FOODS  The following food groups are based on a 2000 calorie diet. See your Registered Dietitian for individual calorie needs. Grains and Grain Products (6 to 8 servings daily)  Eat More Often: Whole-wheat bread, brown rice, whole-grain or wheat pasta, quinoa, popcorn without added fat or salt (air popped).   Eat Less Often: White bread, white pasta, white rice, cornbread.  Vegetables (4 to 5 servings daily)  Eat More Often: Fresh, frozen, and canned vegetables. Vegetables may be raw, steamed, roasted, or grilled with a minimal amount of fat.   Eat Less Often/Avoid: Creamed or fried  vegetables. Vegetables in a cheese sauce.  Fruit (4 to 5 servings daily)  Eat More Often: All fresh, canned (in natural juice), or frozen fruits. Dried fruits without added sugar. One hundred percent fruit juice ( cup [237 mL] daily).   Eat Less Often: Dried fruits with added sugar. Canned fruit in light or heavy syrup.  [employer], Fish, and Poultry (2 servings or less daily. One serving is 3 to 4 oz [85-114 g]).  Eat More Often: Ninety percent or leaner ground beef, tenderloin, sirloin. Round cuts of beef, chicken breast, [place] breast. All fish. Grill, bake, or broil your meat. Nothing should be fried.   Eat Less Often/Avoid: Fatty cuts of meat, [place], or chicken leg, thigh, or wing. Fried cuts of meat or fish.  Dairy (2 to 3 servings)  Eat More Often: Low-fat or fat-free milk, low-fat plain or light yogurt, reduced-fat or part-skim cheese.   Eat Less Often/Avoid: Milk (whole, 2%, skim, or chocolate).Whole milk yogurt. Full-fat cheeses.  Nuts, Seeds, and Legumes (4 to 5 servings per week)  Eat More Often: All without added salt.   Eat Less Often/Avoid: Salted nuts and seeds, canned beans with added salt.  Fats and Sweets (limited)  Eat More Often: Vegetable oils, tub margarines without trans fats, sugar-free gelatin. Mayonnaise and salad dressings.   Eat Less Often/Avoid: Coconut oils, palm oils, butter, stick margarine, cream, half and half, cookies, candy, pie.  FOR MORE INFORMATION The Dash Diet Eating Plan: www.dashdiet.org Document Released: 01/05/2011 Document Reviewed: 12/26/2010 [facility] Patient Information 2012 [place], [place].

## 2011-05-09 ENCOUNTER — Ambulatory Visit (HOSPITAL_COMMUNITY): Payer: 59 | Attending: Cardiovascular Disease | Admitting: Radiology

## 2011-05-09 VITALS — BP 116/79 | Ht 70.0 in | Wt 207.0 lb

## 2011-05-09 DIAGNOSIS — R209 Unspecified disturbances of skin sensation: Secondary | ICD-10-CM | POA: Insufficient documentation

## 2011-05-09 DIAGNOSIS — M79609 Pain in unspecified limb: Secondary | ICD-10-CM | POA: Insufficient documentation

## 2011-05-09 DIAGNOSIS — R002 Palpitations: Secondary | ICD-10-CM | POA: Insufficient documentation

## 2011-05-09 DIAGNOSIS — E785 Hyperlipidemia, unspecified: Secondary | ICD-10-CM | POA: Insufficient documentation

## 2011-05-09 DIAGNOSIS — R5381 Other malaise: Secondary | ICD-10-CM | POA: Insufficient documentation

## 2011-05-09 DIAGNOSIS — R0609 Other forms of dyspnea: Secondary | ICD-10-CM | POA: Insufficient documentation

## 2011-05-09 DIAGNOSIS — Z87891 Personal history of nicotine dependence: Secondary | ICD-10-CM | POA: Insufficient documentation

## 2011-05-09 DIAGNOSIS — R079 Chest pain, unspecified: Secondary | ICD-10-CM

## 2011-05-09 DIAGNOSIS — R0602 Shortness of breath: Secondary | ICD-10-CM

## 2011-05-09 DIAGNOSIS — I447 Left bundle-branch block, unspecified: Secondary | ICD-10-CM | POA: Insufficient documentation

## 2011-05-09 DIAGNOSIS — Z01810 Encounter for preprocedural cardiovascular examination: Secondary | ICD-10-CM

## 2011-05-09 DIAGNOSIS — R0989 Other specified symptoms and signs involving the circulatory and respiratory systems: Secondary | ICD-10-CM | POA: Insufficient documentation

## 2011-05-09 MED ORDER — TECHNETIUM TC 99M TETROFOSMIN IV KIT
33.0000 | PACK | Freq: Once | INTRAVENOUS | Status: AC | PRN
  Administered 2011-05-09: 33 via INTRAVENOUS

## 2011-05-09 MED ORDER — ADENOSINE (DIAGNOSTIC) 3 MG/ML IV SOLN
0.5600 mg/kg | Freq: Once | INTRAVENOUS | Status: AC
  Administered 2011-05-09: 52.5 mg via INTRAVENOUS

## 2011-05-09 MED ORDER — TECHNETIUM TC 99M TETROFOSMIN IV KIT
10.8000 | PACK | Freq: Once | INTRAVENOUS | Status: AC | PRN
  Administered 2011-05-09: 11 via INTRAVENOUS

## 2011-05-09 NOTE — Progress Notes (Signed)
[facility] SITE 3 NUCLEAR MED [address] [place] [address] [phone]  Cardiology Nuclear Med Study  Kendra [name] is a 41 y.o. female     MRN : [id number]     DOB: 1970-10-21  Procedure Date: 05/09/2011  Nuclear Med Background Indication for Stress Test:  Evaluation for Ischemia and Surgical Clearance:Kidney stone removal pending History:  3/13 Echo:EF=50-55%;mild MVR Cardiac Risk Factors: History of Smoking, LBBB and Lipids  Symptoms:  Chest Pain (last date of chest discomfort 2days ago),Left arm cool and Left fingers numb, DOE, Fatigue and Palpitations   Nuclear Pre-Procedure Caffeine/Decaff Intake:  None NPO After: 8:00pm   Lungs:  clear O2 Sat: 98% on RA IV 0.9% NS with Angio Cath:  20g  IV Site: R Antecubital  IV Started by:  Stanton Kidney, EMT-P  Chest Size (in):  38 Cup Size: C  Height: 5\' 10"  (1.778 m)  Weight:  207 lb (93.895 kg)  BMI:  Body mass index is 29.70 kg/(m^2). Tech Comments:  Not yet started metoprolol, per patient.    Nuclear Med Study 1 or 2 day study: 1 day  Stress Test Type:  Adenosine  Reading MD: Charlton Haws, MD  Order Authorizing Provider:  Jannette Spanner  Resting Radionuclide: Technetium 67m Tetrofosmin  Resting Radionuclide Dose: 10.8 mCi   Stress Radionuclide:  Technetium 31m Tetrofosmin  Stress Radionuclide Dose: 33.0 mCi           Stress Protocol Rest HR: 86 Stress HR: 112  Rest BP: 116/79 Stress BP: 117/74  Exercise Time (min): n/a METS: n/a   Predicted Max HR: 181 bpm % Max HR: 61.33 bpm Rate Pressure Product: 86578   Dose of Adenosine (mg):  52.7 Dose of Lexiscan: n/a mg  Dose of Atropine (mg): n/a Dose of Dobutamine: n/a mcg/kg/min (at max HR)  Stress Test Technologist: Cathlyn Parsons, RN  Nuclear Technologist:  Domenic Polite, CNMT     Rest Procedure:  Myocardial perfusion imaging was performed at rest 45 minutes following the intravenous administration of Technetium 66m Tetrofosmin. Rest  ECG: NSR-LBBB  Stress Procedure:  The patient received IV adenosine at 140 mcg/kg/min for 4 minutes.  There were no significant changes with infusion.  Patient had chest tightness 5-6/10 and SOB with infusion.Technetium 68m Tetrofosmin was injected at the 2 minute mark and quantitative spect images were obtained after a 45 minute delay. Stress ECG: LBBB at rest  QPS Raw Data Images:  Patient motion noted. Stress Images:  Normal homogeneous uptake in all areas of the myocardium. Rest Images:  Normal homogeneous uptake in all areas of the myocardium. Subtraction (SDS):  Normal Transient Ischemic Dilatation (Normal <1.22):  1.13 Lung/Heart Ratio (Normal <0.45):  0.34  Quantitative Gated Spect Images QGS EDV:  134 ml QGS ESV:  77 ml  Impression Exercise Capacity:  Adenosine study with no exercise. BP Response:  Normal blood pressure response. Clinical Symptoms:  There is dyspnea. ECG Impression:  LBBB at rest Comparison with Prior Nuclear Study: No images to compare  Overall Impression:  Low risk stress nuclear study. No ischemia or infarct.  Baseline ECG LBBB with decreased EF  LV Ejection Fraction: 43%.  LV Wall Motion:  Apical hypokinesis   Charlton Haws

## 2011-05-15 ENCOUNTER — Telehealth: Payer: Self-pay | Admitting: Cardiovascular Disease

## 2011-05-15 NOTE — Telephone Encounter (Signed)
msg left to call back

## 2011-05-15 NOTE — Telephone Encounter (Signed)
Call returned and pt given results of stress test

## 2011-05-15 NOTE — Telephone Encounter (Signed)
Pt rtn your call

## 2011-05-15 NOTE — Telephone Encounter (Signed)
New problem:  patient calling for test results.

## 2011-05-16 ENCOUNTER — Encounter: Payer: Self-pay | Admitting: *Deleted

## 2011-06-21 ENCOUNTER — Encounter: Payer: Self-pay | Admitting: Family Medicine

## 2011-06-21 ENCOUNTER — Ambulatory Visit (INDEPENDENT_AMBULATORY_CARE_PROVIDER_SITE_OTHER): Payer: 59 | Admitting: Family Medicine

## 2011-06-21 VITALS — BP 140/80 | HR 96 | Temp 99.0°F | Ht 70.0 in | Wt 210.0 lb

## 2011-06-21 DIAGNOSIS — R062 Wheezing: Secondary | ICD-10-CM

## 2011-06-21 DIAGNOSIS — J189 Pneumonia, unspecified organism: Secondary | ICD-10-CM

## 2011-06-21 DIAGNOSIS — J157 Pneumonia due to Mycoplasma pneumoniae: Secondary | ICD-10-CM

## 2011-06-21 MED ORDER — LEVOFLOXACIN 500 MG PO TABS: 500.0000 mg | ORAL_TABLET | Freq: Every day | ORAL | Status: AC

## 2011-06-21 MED ORDER — HYDROCODONE-HOMATROPINE 5-1.5 MG/5ML PO SYRP: ORAL_SOLUTION | ORAL | Status: AC

## 2011-06-21 NOTE — Progress Notes (Signed)
  Patient Name: Kendra [name] Date of Birth: 06-Nov-1970 Age: 41 y.o. Medical Record Number: [id number] Gender: female Date of Encounter: 06/21/2011  History of Present Illness:  Kendra [name] is a 41 y.o. very pleasant female patient who presents with the following:  Very pleasant patient who presents with some illness over the last 4 or 5 days, and initially she had some congestion nasally as well as some coughing, but now she has had a dramatic worsening in the last 2 days. She is a very deep cough and some pain in her chest. Production of some sputum. She was coughing a lot along. She was wheezing some, though she denies history of having any history of asthma or chronic obstructive pulmonary disease. She used her son's albuterol inhaler, and that did help some of her symptoms.  Past Medical History, Surgical History, Social History, Family History, Problem List, Medications, and Allergies have been reviewed and updated if relevant.  Review of Systems: ROS: GEN: Acute illness details above GI: Tolerating PO intake GU: maintaining adequate hydration and urination Pulm: mild occ SOB Interactive and getting along well at home.  Otherwise, ROS is as per the HPI.   Physical Examination: Filed Vitals:   06/21/11 1017  BP: 140/80  Pulse: 96  Temp: 99 F (37.2 C)  TempSrc: Oral  Height: 5\' 10"  (1.778 m)  Weight: 210 lb (95.255 kg)  SpO2: 97%    Body mass index is 30.13 kg/(m^2).   GEN: A and O x 3. WDWN. NAD.    ENT: Nose clear, ext NML.  No LAD.  No JVD.  TM's clear. Oropharynx clear.  PULM: Normal WOB, no distress. Diffuse exp wheezing with insp crackles B lower lobes CV: RRR, no M/G/R, No rubs, No JVD.   EXT: warm and well-perfused, No c/c/e. PSYCH: Pleasant and conversant.   Assessment and Plan:  1. Walking pneumonia   2. Wheezing    B PNA, lung exam fairly bad on a healthy young patient. LVQ Hycodan prn Albuterol sample given from office  Orders  Today: No orders of the defined types were placed in this encounter.    Medications Today: Meds ordered this encounter  Medications  . levofloxacin (LEVAQUIN) 500 MG tablet    Sig: Take 1 tablet (500 mg total) by mouth daily.    Dispense:  10 tablet    Refill:  0  . HYDROcodone-homatropine (HYCODAN) 5-1.5 MG/5ML syrup    Sig: 1 tsp po at night before bed prn cough    Dispense:  240 mL    Refill:  0

## 2011-08-16 ENCOUNTER — Ambulatory Visit (INDEPENDENT_AMBULATORY_CARE_PROVIDER_SITE_OTHER): Payer: 59 | Admitting: Cardiovascular Disease

## 2011-08-16 ENCOUNTER — Encounter: Payer: Self-pay | Admitting: Cardiovascular Disease

## 2011-08-16 VITALS — BP 134/92 | HR 80 | Ht 70.0 in | Wt 209.2 lb

## 2011-08-16 DIAGNOSIS — I509 Heart failure, unspecified: Secondary | ICD-10-CM

## 2011-08-16 DIAGNOSIS — I5022 Chronic systolic (congestive) heart failure: Secondary | ICD-10-CM | POA: Insufficient documentation

## 2011-08-16 MED ORDER — CARVEDILOL 3.125 MG PO TABS: 3.1250 mg | ORAL_TABLET | Freq: Two times a day (BID) | ORAL | Status: DC

## 2011-08-16 NOTE — Patient Instructions (Addendum)
Your physician recommends that you schedule a follow-up appointment in: 3 months   Your physician has recommended you make the following change in your medication:   STOP METOPROLOL DUE TO INTOLERANCE OF MED. START COREG/CARVEDILOL 3.125 MG ONE TABLET TWICE DAILY

## 2011-08-16 NOTE — Progress Notes (Signed)
Georgette Shell Date of Birth  Jul 16, 1970 [facility]     [place] [facility]  [address]    [address]   [address] [place], [place]  [address]    [place], [place]  [address] [phone]  Fax  [phone]  [phone]  Fax [phone]  Problem List: 1. Left bundle branch block 2. Chest pain 3. Kidney stone  History of Present Illness:  [name] is a 41 year old female. She presented to her medical Dr. with some upper left-sided abdominal pain/breast pain.  This pain is occasionally pleuritic. She was thought to have a pulmonary illness. A d-dimer was subsequent on and found to be negative the An EKG showed left bundle branch block.   A subsequent echocardiogram revealed dyssynchrony of her septum.  She does not exercise on regular basis. She walks occasionally. She's been in the process of moving. She has noticed that she's a little bit more short of breath moving boxes. She's also noticed some left arm tingling and numbness which radiates down to her fingers.  We performed a Lexiscan Myoview:  Overall Impression: Low risk stress nuclear study. No ischemia or infarct. Baseline ECG LBBB with decreased EF  LV Ejection Fraction: 43%. LV Wall Motion: Apical hypokinesis  We tried Metoprolol 25 BID but she had significant fatigue and shortness of breath and so she discontinued it. She also had some leg swelling.   Current Outpatient Prescriptions on File Prior to Visit  Medication Sig Dispense Refill  . Aspirin-Acetaminophen-Caffeine (EXCEDRIN PO) Take by mouth as needed.       . Ibuprofen (ADVIL PO) Take by mouth as needed.       . metoprolol tartrate (LOPRESSOR) 25 MG tablet Take 1 tablet (25 mg total) by mouth 2 (two) times daily.  60 tablet  11  . Multiple Vitamin (MULTIVITAMIN) tablet Take 1 tablet by mouth daily.        . cyclobenzaprine (FLEXERIL) 10 MG tablet Take 10 mg by mouth daily.        Allergies  Allergen Reactions  . Codeine     REACTION: hives    . Penicillins     REACTION: hives    Past Medical History  Diagnosis Date  . Depression   . Hyperlipidemia   . Chronic headache   . Kidney stones   . Former smoker     quit in 18    Past Surgical History  Procedure Date  . Wrist surgery 11/2001  . Foot surgery 12/2005  . Cholecystectomy   . Tonsillectomy 03/2010    History  Smoking status  . Former Smoker  . Quit date: 01/31/1991  Smokeless tobacco  . Never Used    History  Alcohol Use  . Yes    Family History  Problem Relation Age of Onset  . Hyperlipidemia Mother   . Hyperlipidemia Brother   . Hyperlipidemia Brother     Reviw of Systems:  Reviewed in the HPI.  All other systems are negative.  Physical Exam: Blood pressure 134/92, pulse 80, height 5\' 10"  (1.778 m), weight 209 lb 3.2 oz (94.892 kg). General: Well developed, well nourished, in no acute distress.  Head: Normocephalic, atraumatic, sclera non-icteric, mucus membranes are moist,   Neck: Supple. Carotids are 2 + without bruits. No JVD  Lungs: Clear bilaterally to auscultation.  Heart: regular rate.  normal  S1 S2. No murmurs, gallops or rubs.  Abdomen: Soft, non-tender, non-distended with normal bowel sounds. No hepatomegaly. No rebound/guarding. No  masses.  Msk:  Strength and tone are normal  Extremities: No clubbing or cyanosis. No edema.  Distal pedal pulses are 2+ and equal bilaterally.  Neuro: Alert and oriented X 3. Moves all extremities spontaneously.  Psych:  Responds to questions appropriately with a normal affect.  ECG: NSR. LBBB.  Assessment / Plan:

## 2011-08-16 NOTE — Assessment & Plan Note (Signed)
Kendra [name] has mild systolic congestive heart failure. This is probably rather chronic. She has a left bundle branch block. She has some fatigue but has not had any we'll symptoms of congestive heart failure. She does not have any significant edema.  We recently performed a stress Myoview study. She has a mildly reduced left ventricular systolic function with an ejection fraction of 43%. She has no evidence of ischemia.  She did not tolerate metoprolol. We will try her on carvedilol. We'll start 3.125 mg twice a day. I'll see her again in 3 months for followup visit. We will anticipate doing another echocardiogram again in approximately one year.  I've told her that I suspect that she'll need a biventricular pacer at some point in her life. If her left ventricular systolic function falls, we will certainly need to consider her for biventricular pacer.

## 2011-10-03 ENCOUNTER — Telehealth: Payer: Self-pay | Admitting: Family Medicine

## 2011-10-03 DIAGNOSIS — Z1231 Encounter for screening mammogram for malignant neoplasm of breast: Secondary | ICD-10-CM

## 2011-10-03 NOTE — Telephone Encounter (Signed)
Will do the ref

## 2011-10-03 NOTE — Telephone Encounter (Signed)
Pt is needing to schedule her Mammo. This will be her first one and this is just for screening. She would like [facility].

## 2011-10-31 ENCOUNTER — Ambulatory Visit: Payer: 59 | Admitting: Nurse Practitioner

## 2011-11-01 ENCOUNTER — Ambulatory Visit: Payer: 59

## 2011-11-01 ENCOUNTER — Ambulatory Visit
Admission: RE | Admit: 2011-11-01 | Discharge: 2011-11-01 | Disposition: A | Discharge status: Home / Self Care | Payer: 59 | Source: Ambulatory Visit | Attending: Family Medicine | Admitting: Family Medicine

## 2011-11-01 DIAGNOSIS — Z1231 Encounter for screening mammogram for malignant neoplasm of breast: Secondary | ICD-10-CM

## 2011-11-06 ENCOUNTER — Encounter: Payer: Self-pay | Admitting: *Deleted

## 2011-11-20 ENCOUNTER — Ambulatory Visit (INDEPENDENT_AMBULATORY_CARE_PROVIDER_SITE_OTHER): Payer: 59 | Admitting: Cardiovascular Disease

## 2011-11-20 ENCOUNTER — Encounter: Payer: Self-pay | Admitting: Cardiovascular Disease

## 2011-11-20 VITALS — BP 120/80 | HR 72 | Ht 70.0 in | Wt 213.8 lb

## 2011-11-20 DIAGNOSIS — I509 Heart failure, unspecified: Secondary | ICD-10-CM

## 2011-11-20 DIAGNOSIS — I447 Left bundle-branch block, unspecified: Secondary | ICD-10-CM

## 2011-11-20 DIAGNOSIS — I5022 Chronic systolic (congestive) heart failure: Secondary | ICD-10-CM

## 2011-11-20 DIAGNOSIS — I454 Nonspecific intraventricular block: Secondary | ICD-10-CM

## 2011-11-20 LAB — LDL CHOLESTEROL, DIRECT: Direct LDL: 141.4 mg/dL

## 2011-11-20 LAB — HEPATIC FUNCTION PANEL
ALT: 27 U/L (ref 0–35)
AST: 25 U/L (ref 0–37)
Albumin: 3.8 g/dL (ref 3.5–5.2)
Alkaline Phosphatase: 56 U/L (ref 39–117)
Total Bilirubin: 0.5 mg/dL (ref 0.3–1.2)

## 2011-11-20 LAB — BASIC METABOLIC PANEL
BUN: 9 mg/dL (ref 6–23)
Calcium: 9.1 mg/dL (ref 8.4–10.5)
Creatinine, Ser: 0.7 mg/dL (ref 0.4–1.2)

## 2011-11-20 LAB — LIPID PANEL
Cholesterol: 236 mg/dL — ABNORMAL HIGH (ref 0–200)
Triglycerides: 94 mg/dL (ref 0.0–149.0)

## 2011-11-20 MED ORDER — CARVEDILOL 6.25 MG PO TABS: 6.2500 mg | ORAL_TABLET | Freq: Two times a day (BID) | ORAL | Status: DC

## 2011-11-20 NOTE — Patient Instructions (Addendum)
Your physician recommends that you schedule a follow-up appointment in: 3 months   Your physician recommends that you return for a FASTING lipid profile: 3 months   Your physician has recommended you make the following change in your medication:   Increase coreg to 6.25 mg twice a day.    REDUCE HIGH SODIUM FOODS LIKE CANNED SOUP, GRAVY, SAUCES, READY PREPARED FOODS LIKE FROZEN FOODS; LEAN CUISINE, LASAGNA. BACON, SAUSAGE, LUNCH MEAT, FAST FOODS.Marland Kitchen   DASH Diet The DASH diet stands for "Dietary Approaches to Stop Hypertension." It is a healthy eating plan that has been shown to reduce high blood pressure (hypertension) in as little as 14 days, while also possibly providing other significant health benefits. These other health benefits include reducing the risk of breast cancer after menopause and reducing the risk of type 2 diabetes, heart disease, colon cancer, and stroke. Health benefits also include weight loss and slowing kidney failure in patients with chronic kidney disease.  DIET GUIDELINES  Limit salt (sodium). Your diet should contain less than 1500 mg of sodium daily.  Limit refined or processed carbohydrates. Your diet should include mostly whole grains. Desserts and added sugars should be used sparingly.  Include small amounts of heart-healthy fats. These types of fats include nuts, oils, and tub margarine. Limit saturated and trans fats. These fats have been shown to be harmful in the body. CHOOSING FOODS  The following food groups are based on a 2000 calorie diet. See your Registered Dietitian for individual calorie needs. Grains and Grain Products (6 to 8 servings daily)  Eat More Often: Whole-wheat bread, brown rice, whole-grain or wheat pasta, quinoa, popcorn without added fat or salt (air popped).  Eat Less Often: White bread, white pasta, white rice, cornbread. Vegetables (4 to 5 servings daily)  Eat More Often: Fresh, frozen, and canned vegetables. Vegetables may be  raw, steamed, roasted, or grilled with a minimal amount of fat.  Eat Less Often/Avoid: Creamed or fried vegetables. Vegetables in a cheese sauce. Fruit (4 to 5 servings daily)  Eat More Often: All fresh, canned (in natural juice), or frozen fruits. Dried fruits without added sugar. One hundred percent fruit juice ( cup [237 mL] daily).  Eat Less Often: Dried fruits with added sugar. Canned fruit in light or heavy syrup. [employer], Fish, and Poultry (2 servings or less daily. One serving is 3 to 4 oz [85-114 g]).  Eat More Often: Ninety percent or leaner ground beef, tenderloin, sirloin. Round cuts of beef, chicken breast, [place] breast. All fish. Grill, bake, or broil your meat. Nothing should be fried.  Eat Less Often/Avoid: Fatty cuts of meat, [place], or chicken leg, thigh, or wing. Fried cuts of meat or fish. Dairy (2 to 3 servings)  Eat More Often: Low-fat or fat-free milk, low-fat plain or light yogurt, reduced-fat or part-skim cheese.  Eat Less Often/Avoid: Milk (whole, 2%).Whole milk yogurt. Full-fat cheeses. Nuts, Seeds, and Legumes (4 to 5 servings per week)  Eat More Often: All without added salt.  Eat Less Often/Avoid: Salted nuts and seeds, canned beans with added salt. Fats and Sweets (limited)  Eat More Often: Vegetable oils, tub margarines without trans fats, sugar-free gelatin. Mayonnaise and salad dressings.  Eat Less Often/Avoid: Coconut oils, palm oils, butter, stick margarine, cream, half and half, cookies, candy, pie. FOR MORE INFORMATION The Dash Diet Eating Plan: www.dashdiet.org Document Released: 01/05/2011 Document Revised: 04/10/2011 Document Reviewed: 01/05/2011 [facility] Patient Information 2013 [place], [place].

## 2011-11-20 NOTE — Progress Notes (Signed)
Georgette Shell Date of Birth  1970/12/15 [facility]     [place] [facility]  [address]    [address]   [address] [place], [place]  [address]    [place], [place]  [address] [phone]  Fax  [phone]  [phone]  Fax [phone]  Problem List: 1. Left bundle branch block 2. Chest pain 3. Kidney stone  History of Present Illness:  Kendra [name] is a 41 year old female. She presented to her medical Dr. with some upper left-sided abdominal pain/breast pain.  This pain is occasionally pleuritic. She was thought to have a pulmonary illness. A d-dimer was subsequent on and found to be negative the An EKG showed left bundle branch block.   A subsequent echocardiogram revealed dyssynchrony of her septum.  She does not exercise on regular basis. She walks occasionally. She's been in the process of moving. She has noticed that she's a little bit more short of breath moving boxes. She's also noticed some left arm tingling and numbness which radiates down to her fingers.  We performed a Lexiscan Myoview which revealed an EF of 43%.  Overall Impression: Low risk stress nuclear study. No ischemia or infarct. Baseline ECG LBBB with decreased EF  LV Ejection Fraction: 43%. LV Wall Motion: Apical hypokinesis  We initially had her on Toprol-XL but she did not tolerate it. She felt very lethargic. We now have her on carvedilol which he seems to tolerate much better.  She's exercising on occasion.  She has been measuring her BP on occasion.  Her readings have been ok but several have been elevated.  Current Outpatient Prescriptions on File Prior to Visit  Medication Sig Dispense Refill  . Aspirin-Acetaminophen-Caffeine (EXCEDRIN PO) Take by mouth as needed.       Marland Kitchen buPROPion (WELLBUTRIN XL) 150 MG 24 hr tablet Take 150 mg by mouth daily.      . carvedilol (COREG) 3.125 MG tablet Take 1 tablet (3.125 mg total) by mouth 2 (two) times daily.  60 tablet  11  . Ibuprofen (ADVIL PO)  Take by mouth as needed.       . Multiple Vitamin (MULTIVITAMIN) tablet Take 1 tablet by mouth daily.          Allergies  Allergen Reactions  . Codeine     REACTION: hives  . Penicillins     REACTION: hives    Past Medical History  Diagnosis Date  . Depression   . Hyperlipidemia   . Chronic headache   . Kidney stones   . Former smoker     quit in 16    Past Surgical History  Procedure Date  . Wrist surgery 11/2001  . Foot surgery 12/2005  . Cholecystectomy   . Tonsillectomy 03/2010    History  Smoking status  . Former Smoker  . Quit date: 01/31/1991  Smokeless tobacco  . Never Used    History  Alcohol Use  . Yes    Family History  Problem Relation Age of Onset  . Hyperlipidemia Mother   . Hyperlipidemia Brother   . Hyperlipidemia Brother     Reviw of Systems:  Reviewed in the HPI.  All other systems are negative.  Physical Exam: Blood pressure 142/88, pulse 72, height 5\' 10"  (1.778 m), weight 213 lb 12.8 oz (96.979 kg). General: Well developed, well nourished, in no acute distress.  Head: Normocephalic, atraumatic, sclera non-icteric, mucus membranes are moist,   Neck: Supple. Carotids are 2 + without bruits. No  JVD  Lungs: Clear bilaterally to auscultation.  Heart: regular rate.  normal  S1 S2. No murmurs, gallops or rubs.  Abdomen: Soft, non-tender, non-distended with normal bowel sounds. No hepatomegaly. No rebound/guarding. No masses.  Msk:  Strength and tone are normal  Extremities: No clubbing or cyanosis. No edema.  Distal pedal pulses are 2+ and equal bilaterally.  Neuro: Alert and oriented X 3. Moves all extremities spontaneously.  Psych:  Responds to questions appropriately with a normal affect.  ECG: Oct. 21, 2013 - NSR at 71 LBBB.  Assessment / Plan:

## 2011-11-20 NOTE — Assessment & Plan Note (Signed)
He seems to be doing well. She has a left ventricular systolic ejection fraction of 96% by Myoview study.  She has been careful with her salt intake. She still eats some salty foods.  We will increase her carvedilol to 6.25 mg twice a day. We will not add a diuretic at but I will consider adding HCTZ at her next visit if her blood pressure remains elevated. I have encouraged her to eat a low-salt diet and we have given her information on the DASH diet.  I've asked her to exercise on a regular basis. I will see  her again in 3 months for followup office visit and fasting labs.

## 2011-11-20 NOTE — Assessment & Plan Note (Addendum)
Kendra [name] seems to be doing well. She has a chronic left bundle branch block. Her ejection fraction is mildly depressed at 43 %  but it is not low enough that we need to consider a biventricular pacer. I have encouraged her to exercise and to lose weight.  We will increase her carvedilol to 6.25 mg twice a day. We'll consider adding an ACE inhibitor at her next visit. I'll see her again in 3 months.

## 2011-12-25 ENCOUNTER — Telehealth: Payer: Self-pay | Admitting: *Deleted

## 2011-12-25 NOTE — Telephone Encounter (Signed)
Last ov there was an order for NMR lipomed, called pt to let her know it was not drawn. Told her to call back if she would like the test done or per dr nahser she could wait till next ov. Number provided.

## 2012-02-20 ENCOUNTER — Other Ambulatory Visit: Payer: 59

## 2012-02-20 ENCOUNTER — Other Ambulatory Visit: Payer: Self-pay | Admitting: *Deleted

## 2012-02-20 DIAGNOSIS — I5022 Chronic systolic (congestive) heart failure: Secondary | ICD-10-CM

## 2012-02-20 DIAGNOSIS — E785 Hyperlipidemia, unspecified: Secondary | ICD-10-CM

## 2012-02-21 ENCOUNTER — Other Ambulatory Visit: Payer: 59

## 2012-02-21 ENCOUNTER — Ambulatory Visit: Payer: 59 | Admitting: Cardiovascular Disease

## 2012-02-26 ENCOUNTER — Other Ambulatory Visit: Payer: Self-pay | Admitting: Urology

## 2012-02-27 ENCOUNTER — Ambulatory Visit (INDEPENDENT_AMBULATORY_CARE_PROVIDER_SITE_OTHER): Payer: 59 | Admitting: Nurse Practitioner

## 2012-02-27 ENCOUNTER — Encounter: Payer: Self-pay | Admitting: Nurse Practitioner

## 2012-02-27 VITALS — BP 138/88 | HR 72 | Ht 70.0 in | Wt 218.0 lb

## 2012-02-27 DIAGNOSIS — Z01810 Encounter for preprocedural cardiovascular examination: Secondary | ICD-10-CM

## 2012-02-27 DIAGNOSIS — E785 Hyperlipidemia, unspecified: Secondary | ICD-10-CM

## 2012-02-27 LAB — CBC WITH DIFFERENTIAL/PLATELET
Basophils Absolute: 0 10*3/uL (ref 0.0–0.1)
Basophils Relative: 0.5 % (ref 0.0–3.0)
Eosinophils Absolute: 0.1 10*3/uL (ref 0.0–0.7)
Eosinophils Relative: 1.7 % (ref 0.0–5.0)
HCT: 38.5 % (ref 36.0–46.0)
Hemoglobin: 12.9 g/dL (ref 12.0–15.0)
Lymphocytes Relative: 25.5 % (ref 12.0–46.0)
Lymphs Abs: 1.5 10*3/uL (ref 0.7–4.0)
MCHC: 33.5 g/dL (ref 30.0–36.0)
MCV: 89.8 fl (ref 78.0–100.0)
Monocytes Absolute: 0.5 10*3/uL (ref 0.1–1.0)
Monocytes Relative: 8.2 % (ref 3.0–12.0)
Neutro Abs: 3.7 10*3/uL (ref 1.4–7.7)
Neutrophils Relative %: 64.1 % (ref 43.0–77.0)
Platelets: 262 10*3/uL (ref 150.0–400.0)
RBC: 4.29 Mil/uL (ref 3.87–5.11)
RDW: 12.9 % (ref 11.5–14.6)
WBC: 5.8 10*3/uL (ref 4.5–10.5)

## 2012-02-27 LAB — HEPATIC FUNCTION PANEL
ALT: 32 U/L (ref 0–35)
AST: 27 U/L (ref 0–37)
Albumin: 3.9 g/dL (ref 3.5–5.2)
Alkaline Phosphatase: 57 U/L (ref 39–117)
Bilirubin, Direct: 0 mg/dL (ref 0.0–0.3)
Total Bilirubin: 0.8 mg/dL (ref 0.3–1.2)
Total Protein: 7.1 g/dL (ref 6.0–8.3)

## 2012-02-27 LAB — BASIC METABOLIC PANEL
BUN: 14 mg/dL (ref 6–23)
CO2: 26 mEq/L (ref 19–32)
Calcium: 9.1 mg/dL (ref 8.4–10.5)
Chloride: 104 mEq/L (ref 96–112)
Creatinine, Ser: 0.8 mg/dL (ref 0.4–1.2)
GFR: 86.6 mL/min (ref >60.00)
Glucose, Bld: 100 mg/dL — ABNORMAL HIGH (ref 70–99)
Potassium: 4.2 mEq/L (ref 3.5–5.1)
Sodium: 137 mEq/L (ref 135–145)

## 2012-02-27 LAB — LIPID PANEL
Cholesterol: 233 mg/dL — ABNORMAL HIGH (ref 0–200)
HDL: 64.3 mg/dL (ref >39.00)
Total CHOL/HDL Ratio: 4
Triglycerides: 70 mg/dL (ref 0.0–149.0)
VLDL: 14 mg/dL (ref 0.0–40.0)

## 2012-02-27 LAB — LDL CHOLESTEROL, DIRECT: Direct LDL: 147.9 mg/dL

## 2012-02-27 NOTE — Patient Instructions (Addendum)
We are going to check labs today  See Dr. Elease Hashimoto in one month to discuss starting Lisinopril  Ok to proceed with your procedure next week  Call the [facility] office at [phone] if you have any questions, problems or concerns.

## 2012-02-27 NOTE — Progress Notes (Signed)
Kendra [name] Date of Birth: 11/03/1970 Medical Record #[id number]  History of Present Illness: Kendra [name] is seen today for a pre op exam. She is seen for Dr. Elease Hashimoto. She has LV dysfunction with an EF of 43% on Myoview but 50 to 55% per echo. Has LBBB and noted dyssynchrony on past echo. Last visit here was back in October. Has not tolerated Toprol in the past but has done ok on Coreg. EF has not been low enough to warrant BiV pacemaker implant. Plan was to start ACE on her return visit but she has not been here since October.   She comes in today. She is here alone. She has a kidney stone. Needs cystoscopy/ureteroscopy per Dr. Margarita Grizzle for a kidney stone. No cardiac complaints. Not short of breath. Not dizzy or lightheaded. She is active with her housework but not actively exercising. Weight is up a little. She understands the need to get on track with diet/weight loss/exercise. She is fasting today and needs labs. BP has been borderline at home.   Current Outpatient Prescriptions on File Prior to Visit  Medication Sig Dispense Refill  . Aspirin-Acetaminophen-Caffeine (EXCEDRIN PO) Take by mouth as needed.       . carvedilol (COREG) 6.25 MG tablet Take 1 tablet (6.25 mg total) by mouth 2 (two) times daily.  60 tablet  11  . Ibuprofen (ADVIL PO) Take by mouth as needed.       . Multiple Vitamin (MULTIVITAMIN) tablet Take 1 tablet by mouth daily.          Allergies  Allergen Reactions  . Codeine     REACTION: hives  . Penicillins     REACTION: hives    Past Medical History  Diagnosis Date  . Depression   . Hyperlipidemia   . Chronic headache   . Kidney stones   . Former smoker     quit in 89  . Left bundle branch block   . Systolic dysfunction, left ventricle     EF is 43% per Myoview; dyssynchrony of the septum noted on echo; has LBBB  . Obesity     Past Surgical History  Procedure Date  . Wrist surgery 11/2001  . Foot surgery 12/2005  . Cholecystectomy   .  Tonsillectomy 03/2010    History  Smoking status  . Former Smoker  . Quit date: 01/31/1991  Smokeless tobacco  . Never Used    History  Alcohol Use  . Yes    Family History  Problem Relation Age of Onset  . Hyperlipidemia Mother   . Hyperlipidemia Brother   . Hyperlipidemia Brother     Review of Systems: The review of systems is per the HPI.  All other systems were reviewed and are negative.  Physical Exam: BP 138/88  Pulse 72  Ht 5\' 10"  (1.778 m)  Wt 218 lb (98.884 kg)  BMI 31.28 kg/m2 Weight is up 5 pounds from her last visit.  Patient is very pleasant and in no acute distress. She is obese. Skin is warm and dry. Color is normal.  HEENT is unremarkable. Normocephalic/atraumatic. PERRL. Sclera are nonicteric. Neck is supple. No masses. No JVD. Lungs are clear. Cardiac exam shows a regular rate and rhythm. Abdomen is soft. Extremities are without edema. Gait and ROM are intact. No gross neurologic deficits noted.   LABORATORY DATA: EKG today shows sinus with LBBB  Lab Results  Component Value Date   WBC 7.6 04/07/2011   HGB 15.0 04/07/2011  HCT 44.5 04/07/2011   PLT 317.0 04/07/2011   GLUCOSE 89 11/20/2011   CHOL 236* 11/20/2011   TRIG 94.0 11/20/2011   HDL 62.00 11/20/2011   LDLDIRECT 141.4 11/20/2011   LDLCALC 123* 09/12/2010   ALT 27 11/20/2011   AST 25 11/20/2011   NA 136 11/20/2011   K 4.6 11/20/2011   CL 103 11/20/2011   CREATININE 0.7 11/20/2011   BUN 9 11/20/2011   CO2 26 11/20/2011   TSH 0.87 04/07/2011   Echo Study Conclusions from March 2013  - Left ventricle: Abnormal septal motion The cavity size was normal. Wall thickness was normal. Systolic function was normal. The estimated ejection fraction was in the range of 50% to 55%. Wall motion was normal; there were no regional wall motion abnormalities. Left ventricular diastolic function parameters were normal. - Mitral valve: Mild regurgitation. - Atrial septum: No defect or patent foramen ovale  was identified.  Myoview Impression from April 2013  Exercise Capacity: Adenosine study with no exercise.  BP Response: Normal blood pressure response.  Clinical Symptoms: There is dyspnea.  ECG Impression: LBBB at rest  Comparison with Prior Nuclear Study: No images to compare   Overall Impression: Low risk stress nuclear study. No ischemia or infarct. Baseline ECG LBBB with decreased EF  LV Ejection Fraction: 43%. LV Wall Motion: Apical hypokinesis   Kendra [name]   Assessment / Plan: 1. Surgical clearance for kidney stone - I think she is an acceptable candidate and low risk from our standpoint. She has no active heart failure, chest pain or shortness of breath.   2. LBBB - chronic - no ischemia on past Myoview last year  3. LV dysfunction - EF 43% by Myoview but 50 to 55% per echo. Has LBBB which could impact the EF. EF is not low enough to warrant BiV pacemaker implant.   4. Obesity - she understands the need for weight loss and diet/exercise.   5. HTN - I will get her back in to see Dr. Elease Hashimoto in a month to discuss starting ACE. I did not want to start this today with her upcoming procedure.   Patient is agreeable to this plan and will call if any problems develop in the interim.

## 2012-03-01 ENCOUNTER — Encounter (HOSPITAL_BASED_OUTPATIENT_CLINIC_OR_DEPARTMENT_OTHER): Payer: Self-pay | Admitting: *Deleted

## 2012-03-01 NOTE — Progress Notes (Signed)
To [facility] at 0945-Hg,urine pregnancy on arrival-(Labs,Ekg in epic)office note and clearance in chart.Reviewed w/Dr Council Mechanic .Currently on medication for upper respiratory infection/congestion-instructed to call us if symptoms not improving.Npo after Mn-will take coreg and prednisone(last dose pack dose) with small amt water that am.

## 2012-03-06 ENCOUNTER — Encounter (HOSPITAL_BASED_OUTPATIENT_CLINIC_OR_DEPARTMENT_OTHER)
Admission: RE | Disposition: A | Discharge status: Home / Self Care | Payer: Self-pay | Source: Ambulatory Visit | Attending: Urology

## 2012-03-06 ENCOUNTER — Ambulatory Visit (HOSPITAL_BASED_OUTPATIENT_CLINIC_OR_DEPARTMENT_OTHER)
Admission: RE | Admit: 2012-03-06 | Discharge: 2012-03-06 | Disposition: A | Discharge status: Home / Self Care | Payer: 59 | Source: Ambulatory Visit | Attending: Urology | Admitting: Urology

## 2012-03-06 ENCOUNTER — Encounter (HOSPITAL_BASED_OUTPATIENT_CLINIC_OR_DEPARTMENT_OTHER): Payer: Self-pay | Admitting: Anesthesiology

## 2012-03-06 ENCOUNTER — Ambulatory Visit (HOSPITAL_BASED_OUTPATIENT_CLINIC_OR_DEPARTMENT_OTHER): Payer: 59 | Admitting: Anesthesiology

## 2012-03-06 ENCOUNTER — Encounter (HOSPITAL_BASED_OUTPATIENT_CLINIC_OR_DEPARTMENT_OTHER): Payer: Self-pay | Admitting: *Deleted

## 2012-03-06 DIAGNOSIS — I447 Left bundle-branch block, unspecified: Secondary | ICD-10-CM | POA: Insufficient documentation

## 2012-03-06 DIAGNOSIS — M25519 Pain in unspecified shoulder: Secondary | ICD-10-CM | POA: Insufficient documentation

## 2012-03-06 DIAGNOSIS — N135 Crossing vessel and stricture of ureter without hydronephrosis: Secondary | ICD-10-CM | POA: Insufficient documentation

## 2012-03-06 DIAGNOSIS — N200 Calculus of kidney: Secondary | ICD-10-CM | POA: Insufficient documentation

## 2012-03-06 DIAGNOSIS — G8929 Other chronic pain: Secondary | ICD-10-CM | POA: Insufficient documentation

## 2012-03-06 DIAGNOSIS — I509 Heart failure, unspecified: Secondary | ICD-10-CM | POA: Insufficient documentation

## 2012-03-06 DIAGNOSIS — R1013 Epigastric pain: Secondary | ICD-10-CM | POA: Insufficient documentation

## 2012-03-06 DIAGNOSIS — E785 Hyperlipidemia, unspecified: Secondary | ICD-10-CM | POA: Insufficient documentation

## 2012-03-06 DIAGNOSIS — I10 Essential (primary) hypertension: Secondary | ICD-10-CM | POA: Insufficient documentation

## 2012-03-06 HISTORY — PX: CYSTOSCOPY WITH RETROGRADE PYELOGRAM, URETEROSCOPY AND STENT PLACEMENT: SHX5789

## 2012-03-06 SURGERY — CYSTOURETEROSCOPY, WITH RETROGRADE PYELOGRAM AND STENT INSERTION
Anesthesia: General | Site: Ureter | Laterality: Left | Wound class: Clean Contaminated

## 2012-03-06 MED ORDER — CIPROFLOXACIN IN D5W 400 MG/200ML IV SOLN
400.0000 mg | INTRAVENOUS | Status: AC
  Administered 2012-03-06: 400 mg via INTRAVENOUS
  Filled 2012-03-06: qty 200

## 2012-03-06 MED ORDER — LIDOCAINE HCL 2 % EX GEL
CUTANEOUS | Status: DC | PRN
  Administered 2012-03-06: 1 via URETHRAL

## 2012-03-06 MED ORDER — HYDROCODONE-ACETAMINOPHEN 5-325 MG PO TABS: 1.0000 | ORAL_TABLET | ORAL | Status: DC | PRN

## 2012-03-06 MED ORDER — IOHEXOL 350 MG/ML SOLN
INTRAVENOUS | Status: DC | PRN
  Administered 2012-03-06: 10 mL

## 2012-03-06 MED ORDER — BELLADONNA ALKALOIDS-OPIUM 16.2-60 MG RE SUPP
RECTAL | Status: DC | PRN
  Administered 2012-03-06: 1 via RECTAL

## 2012-03-06 MED ORDER — LACTATED RINGERS IV SOLN
INTRAVENOUS | Status: DC
  Administered 2012-03-06: 100 mL/h via INTRAVENOUS
  Filled 2012-03-06: qty 1000

## 2012-03-06 MED ORDER — ACETAMINOPHEN 10 MG/ML IV SOLN
1000.0000 mg | Freq: Once | INTRAVENOUS | Status: DC | PRN
  Filled 2012-03-06: qty 100

## 2012-03-06 MED ORDER — OXYBUTYNIN CHLORIDE 5 MG PO TABS
5.0000 mg | ORAL_TABLET | Freq: Three times a day (TID) | ORAL | Status: DC
  Administered 2012-03-06: 5 mg via ORAL
  Filled 2012-03-06: qty 1

## 2012-03-06 MED ORDER — KETOROLAC TROMETHAMINE 30 MG/ML IJ SOLN
INTRAMUSCULAR | Status: DC | PRN
  Administered 2012-03-06: 30 mg via INTRAVENOUS

## 2012-03-06 MED ORDER — MIDAZOLAM HCL 5 MG/5ML IJ SOLN
INTRAMUSCULAR | Status: DC | PRN
  Administered 2012-03-06: 2 mg via INTRAVENOUS

## 2012-03-06 MED ORDER — HYOSCYAMINE SULFATE 0.125 MG PO TABS: 0.1250 mg | ORAL_TABLET | ORAL | Status: DC | PRN

## 2012-03-06 MED ORDER — PROMETHAZINE HCL 25 MG/ML IJ SOLN
6.2500 mg | INTRAMUSCULAR | Status: DC | PRN
  Filled 2012-03-06: qty 1

## 2012-03-06 MED ORDER — HYDROMORPHONE HCL PF 1 MG/ML IJ SOLN
0.2500 mg | INTRAMUSCULAR | Status: DC | PRN
  Filled 2012-03-06: qty 1

## 2012-03-06 MED ORDER — DEXAMETHASONE SODIUM PHOSPHATE 4 MG/ML IJ SOLN
INTRAMUSCULAR | Status: DC | PRN
  Administered 2012-03-06: 10 mg via INTRAVENOUS

## 2012-03-06 MED ORDER — ONDANSETRON HCL 4 MG/2ML IJ SOLN
INTRAMUSCULAR | Status: DC | PRN
  Administered 2012-03-06: 4 mg via INTRAVENOUS

## 2012-03-06 MED ORDER — PROPOFOL 10 MG/ML IV BOLUS
INTRAVENOUS | Status: DC | PRN
  Administered 2012-03-06: 200 mg via INTRAVENOUS

## 2012-03-06 MED ORDER — FENTANYL CITRATE 0.05 MG/ML IJ SOLN
INTRAMUSCULAR | Status: DC | PRN
  Administered 2012-03-06: 50 ug via INTRAVENOUS
  Administered 2012-03-06 (×3): 25 ug via INTRAVENOUS
  Administered 2012-03-06: 50 ug via INTRAVENOUS
  Administered 2012-03-06: 25 ug via INTRAVENOUS

## 2012-03-06 MED ORDER — SODIUM CHLORIDE 0.9 % IR SOLN
Status: DC | PRN
  Administered 2012-03-06: 6000 mL via INTRAVESICAL

## 2012-03-06 MED ORDER — LIDOCAINE HCL (CARDIAC) 20 MG/ML IV SOLN
INTRAVENOUS | Status: DC | PRN
  Administered 2012-03-06: 100 mg via INTRAVENOUS

## 2012-03-06 MED ORDER — LACTATED RINGERS IV SOLN
INTRAVENOUS | Status: DC | PRN
  Administered 2012-03-06 (×2): via INTRAVENOUS

## 2012-03-06 MED ORDER — OXYBUTYNIN CHLORIDE 5 MG PO TABS: 5.0000 mg | ORAL_TABLET | Freq: Four times a day (QID) | ORAL | Status: DC | PRN

## 2012-03-06 MED ORDER — PHENAZOPYRIDINE HCL 100 MG PO TABS: 200.0000 mg | ORAL_TABLET | Freq: Three times a day (TID) | ORAL | Status: DC | PRN

## 2012-03-06 MED ORDER — MEPERIDINE HCL 25 MG/ML IJ SOLN
6.2500 mg | INTRAMUSCULAR | Status: DC | PRN
  Filled 2012-03-06: qty 1

## 2012-03-06 MED ORDER — HYDROCODONE-ACETAMINOPHEN 5-325 MG PO TABS
1.0000 | ORAL_TABLET | Freq: Four times a day (QID) | ORAL | Status: DC | PRN
  Administered 2012-03-06: 1 via ORAL
  Filled 2012-03-06: qty 1

## 2012-03-06 MED ORDER — SENNOSIDES-DOCUSATE SODIUM 8.6-50 MG PO TABS: 1.0000 | ORAL_TABLET | Freq: Two times a day (BID) | ORAL | Status: DC

## 2012-03-06 SURGICAL SUPPLY — 31 items
ADAPTER CATH URET PLST 4-6FR (CATHETERS) ×3 IMPLANT
BAG DRAIN URO-CYSTO SKYTR STRL (DRAIN) ×3 IMPLANT
BASKET LASER NITINOL 1.9FR (BASKET) IMPLANT
BASKET STNLS GEMINI 4WIRE 3FR (BASKET) IMPLANT
BASKET ZERO TIP NITINOL 2.4FR (BASKET) IMPLANT
BRUSH URET BIOPSY 3F (UROLOGICAL SUPPLIES) IMPLANT
CANISTER SUCT LVC 12 LTR MEDI- (MISCELLANEOUS) ×3 IMPLANT
CATH INTERMIT  6FR 70CM (CATHETERS) IMPLANT
CATH URET 5FR 28IN OPEN ENDED (CATHETERS) ×3 IMPLANT
CLOTH BEACON ORANGE TIMEOUT ST (SAFETY) ×3 IMPLANT
DRAPE CAMERA CLOSED 9X96 (DRAPES) ×3 IMPLANT
GLOVE BIO SURGEON STRL SZ7 (GLOVE) ×3 IMPLANT
GLOVE BIOGEL PI IND STRL 7.0 (GLOVE) ×2 IMPLANT
GLOVE BIOGEL PI INDICATOR 7.0 (GLOVE) ×1
GLOVE ECLIPSE 6.5 STRL STRAW (GLOVE) ×3 IMPLANT
GLOVE ECLIPSE 7.0 STRL STRAW (GLOVE) ×3 IMPLANT
GLOVE INDICATOR 7.5 STRL GRN (GLOVE) ×3 IMPLANT
GOWN PREVENTION PLUS LG XLONG (DISPOSABLE) ×6 IMPLANT
GUIDEWIRE ANG ZIPWIRE 038X150 (WIRE) IMPLANT
GUIDEWIRE STR DUAL SENSOR (WIRE) ×6 IMPLANT
IV NS IRRIG 3000ML ARTHROMATIC (IV SOLUTION) ×6 IMPLANT
KIT BALLIN UROMAX 15FX10 (LABEL) IMPLANT
KIT BALLN UROMAX 15FX4 (MISCELLANEOUS) IMPLANT
KIT BALLN UROMAX 26 75X4 (MISCELLANEOUS)
LASER FIBER DISP (UROLOGICAL SUPPLIES) IMPLANT
PACK CYSTOSCOPY (CUSTOM PROCEDURE TRAY) ×3 IMPLANT
SET HIGH PRES BAL DIL (LABEL)
SHEATH ACCESS URETERAL 38CM (SHEATH) ×3 IMPLANT
STENT CONTOUR 6FRX24X.038 (STENTS) IMPLANT
STENT URET 6FRX24 CONTOUR (STENTS) ×3 IMPLANT
SYRINGE IRR TOOMEY STRL 70CC (SYRINGE) IMPLANT

## 2012-03-06 NOTE — H&P (Signed)
Urology History and Physical Exam  CC: Nephrolithiasis  HPI: 42 year old female presents today for treatment of her urolithiasis. She has a left lower pole stone. It has been present since at least 2011. It is 9 x 8 mm in size. She has chronic abdominal pain which is located in her epigastric area and radiates to her left shoulder. We have discussed that her pain is not consistent with renal colic and dthat this surgery is unlikely to help with her pain. He stone is large enough that if it starts to grow it may be difficult to treat with ureteroscopy, and because of this I feel it is reasonable to treat her stone. She presents for cystoscopy, left retrograde pyelogram, left ureteroscopy, laser lithotripsy, and left ureter stent placement. We have discussed the risks, benefits, alternatives, and likelihood of achieving her goals. She was cleared by Norma Fredrickson at [facility] cardiology as low risk due to her LBBB. Her urine culture from 02/23/12 had insignificant growth.  PMH: Past Medical History  Diagnosis Date  . Depression   . Hyperlipidemia   . Chronic headache   . Kidney stones   . Former smoker     quit in 41  . Left bundle branch block   . Systolic dysfunction, left ventricle     EF is 43% per Myoview; dyssynchrony of the septum noted on echo; has LBBB  . Obesity     PSH: Past Surgical History  Procedure Date  . Wrist surgery 11/2001  . Foot surgery 12/2005  . Cholecystectomy 2009  . Tonsillectomy 03/2010    Allergies: Allergies  Allergen Reactions  . Codeine     REACTION: hives  . Penicillins     REACTION: hives    Medications: No prescriptions prior to admission     Social History: History   Social History  . Marital Status: Divorced    Spouse Name: N/A    Number of Children: N/A  . Years of Education: N/A   Occupational History  . [employer]    Social History Main Topics  . Smoking status: Former Smoker    Quit date: 01/31/1991  . Smokeless  tobacco: Never Used  . Alcohol Use: Yes  . Drug Use: No  . Sexually Active: Not on file   Other Topics Concern  . Not on file   Social History Narrative   Daily caffeine use:  Sweet Tea dailyPatient does not get regular exercise    Family History: Family History  Problem Relation Age of Onset  . Hyperlipidemia Mother   . Hyperlipidemia Brother   . Hyperlipidemia Brother     Review of Systems: Positive: Upper respiratory tract infection- Cold virus. Negative: Fever, chest pain, or nausea.  A further 10 point review of systems was negative except what is listed in the HPI.  Physical Exam: Filed Vitals:   03/06/12 0941  BP: 139/89  Pulse: 73  Temp: 97.5 F (36.4 C)  Resp: 18    General: No acute distress.  Awake. Head:  Normocephalic.  Atraumatic. ENT:  EOMI.  Mucous membranes moist Neck:  Supple.  No lymphadenopathy. CV:  S1 present. S2 present. Regular rate. Pulmonary: Equal effort bilaterally.  Clear to auscultation bilaterally. Abdomen: Soft.  Non- tender to palpation. Skin:  Normal turgor.  No visible rash. Extremity: No gross deformity of bilateral upper extremities.  No gross deformity of    bilateral lower extremities. Neurologic: Alert. Appropriate mood.    Studies:  No results found for this  basename: HGB:2,WBC:2,PLT:2 in the last 72 hours  No results found for this basename: NA:2,K:2,CL:2,CO2:2,BUN:2,CREATININE:2,CALCIUM:2,MAGNESIUM:2,GFRNONAA:2,GFRAA:2 in the last 72 hours   No results found for this basename: PT:2,INR:2,APTT:2 in the last 72 hours   No components found with this basename: ABG:2    Assessment:  Left nephrolithiasis  Plan: -Procedure the operating room for cystoscopy, left retrograde pyelogram, left ureteroscopy, laser lithotripsy, and left ureter stent placement.

## 2012-03-06 NOTE — Op Note (Signed)
Urology Operative Report  Date of Procedure: 03/06/12  Surgeon: Natalia Leatherwood, MD Assistant: None  Preoperative Diagnosis: Left nephrolithiasis Postoperative Diagnosis:  Same, Left ureter stenosis  Procedure(s): Cystoscopy Left retrograde pyelogram With interpretation Left ureteroscopy Left ureter stent placement  Estimated blood loss: None  Specimen: None  Drains: None  Complications: None  Findings: Left distal ureter stenosis.  History of present illness: 42 year old female presents today for left lower pole kidney stone. We have reviewed the risk and benefits of treatment. She elected for ureteroscopy.   Procedure in detail: After informed consent was obtained, the patient was taken to the operating room. They were placed in the supine position. SCDs were turned on and in place. IV antibiotics were infused, and general anesthesia was induced. A timeout was performed in which the correct patient, surgical site, and procedure were identified and agreed upon by the team.  The patient was placed in a dorsolithotomy position, making sure to pad all pertinent neurovascular pressure points. The genitals were prepped and draped in the usual sterile fashion.   A rigid cystoscope was advanced through the urethra and into the bladder. The bladder was fully distended and evaluated in a systematic fashion with a 12 and 70 lens. There were no lesions noted. Attention was turned to the left ureteral orifice. It was cannulated with ease with a 5 French ureter catheter. I injected contrast to obtain a retrograde pyelogram. I did not note any filling defects in the ureter. There appeared to be a left lower pole filling defect consistent with a stone seen on CT scan.  The left ureter was cannulated with 2 cm wires which were placed into the left renal pelvis under fluoroscopy with ease. Next I attempted to place a 12/14 ureter access sheath over one wire while securing the other wire as a  safety wire. This was done under fluoroscopy, but was unsuccessful. I then tried to place only the operator of the access sheath under fluoroscopy, but this was also unsuccessful. Because of this I elected to leave the left ureter stent to allow for passive dilation and repeat ureteroscopy at a future date.  The safety wire was loaded through a cystoscope and a 6 x 24 double-J stent was placed with ease under fluoroscopy and direct visualization. The tethering strings were removed. This completed the procedure. Her bladder was drained. I placed 10 cc of lidocaine jelly into the urethra and a belladonna and opium suppository into her rectum. She was placed in a supine position and anesthesia was reversed. She was taken to the PACU in stable condition.  She will follow up with me next week for symptom check. She will be rescheduled for left ureteroscopy.

## 2012-03-06 NOTE — Anesthesia Procedure Notes (Signed)
Procedure Name: LMA Insertion Date/Time: 03/06/2012 11:03 AM Performed by: Jessica Priest Pre-anesthesia Checklist: Patient identified, Emergency Drugs available, Suction available and Patient being monitored Patient Re-evaluated:Patient Re-evaluated prior to inductionOxygen Delivery Method: Circle System Utilized Preoxygenation: Pre-oxygenation with 100% oxygen Intubation Type: IV induction Ventilation: Mask ventilation without difficulty LMA: LMA inserted LMA Size: 4.0 Number of attempts: 1 Airway Equipment and Method: bite block Placement Confirmation: positive ETCO2 Tube secured with: Tape Dental Injury: Teeth and Oropharynx as per pre-operative assessment

## 2012-03-06 NOTE — Progress Notes (Signed)
Dr. Margarita Grizzle in and talked w pt., explained rationale why stone wasn't retreived.

## 2012-03-06 NOTE — Transfer of Care (Signed)
Immediate Anesthesia Transfer of Care Note  Patient: Kendra [name]  Procedure(s) Performed: Procedure(s) (LRB): CYSTOSCOPY WITH URETEROSCOPY AND STENT PLACEMENT (Left) HOLMIUM LASER APPLICATION (Left)  Patient Location: PACU  Anesthesia Type: General  Level of Consciousness: awake, sedated, patient cooperative and responds to stimulation  Airway & Oxygen Therapy: Patient Spontanous Breathing and Patient connected to face mask oxygen  Post-op Assessment: Report given to PACU RN, Post -op Vital signs reviewed and stable and Patient moving all extremities  Post vital signs: Reviewed and stable  Complications: No apparent anesthesia complications

## 2012-03-06 NOTE — Anesthesia Postprocedure Evaluation (Signed)
Anesthesia Post Note  Patient: Kendra [name]  Procedure(s) Performed: Procedure(s) (LRB): CYSTOSCOPY WITH RETROGRADE PYELOGRAM, URETEROSCOPY AND STENT PLACEMENT (Left)  Anesthesia type: General  Patient location: PACU  Post pain: Pain level controlled  Post assessment: Post-op Vital signs reviewed  Last Vitals: BP 138/74  Pulse 80  Temp 36.2 C (Oral)  Resp 12  Ht 5\' 10"  (1.778 m)  Wt 214 lb 8 oz (97.297 kg)  BMI 30.78 kg/m2  SpO2 100%  LMP 02/13/2012  Post vital signs: Reviewed  Level of consciousness: sedated  Complications: No apparent anesthesia complications

## 2012-03-06 NOTE — Anesthesia Preprocedure Evaluation (Addendum)
Anesthesia Evaluation  Patient identified by MRN, date of birth, ID band Patient awake    Reviewed: Allergy & Precautions, H&P , NPO status , Patient's Chart, lab work & pertinent test results, reviewed documented beta blocker date and time   Airway Mallampati: I TM Distance: >3 FB Neck ROM: Full    Dental  (+) Dental Advisory Given and Teeth Intact   Pulmonary former smoker,  breath sounds clear to auscultation  Pulmonary exam normal       Cardiovascular hypertension, Pt. on medications and Pt. on home beta blockers +CHF - dysrhythmias Rhythm:Regular Rate:Normal     Neuro/Psych  Headaches, Depression    GI/Hepatic negative GI ROS, Neg liver ROS,   Endo/Other  negative endocrine ROS  Renal/GU Renal diseasenegative Renal ROS     Musculoskeletal negative musculoskeletal ROS (+)   Abdominal (+) + obese,   Peds  Hematology negative hematology ROS (+)   Anesthesia Other Findings   Reproductive/Obstetrics negative OB ROS                          Anesthesia Physical Anesthesia Plan  ASA: II  Anesthesia Plan: General   Post-op Pain Management:    Induction: Intravenous  Airway Management Planned: LMA  Additional Equipment:   Intra-op Plan:   Post-operative Plan: Extubation in OR  Informed Consent: I have reviewed the patients History and Physical, chart, labs and discussed the procedure including the risks, benefits and alternatives for the proposed anesthesia with the patient or authorized representative who has indicated his/her understanding and acceptance.   Dental advisory given  Plan Discussed with: CRNA  Anesthesia Plan Comments:         Anesthesia Quick Evaluation

## 2012-03-07 ENCOUNTER — Encounter (HOSPITAL_BASED_OUTPATIENT_CLINIC_OR_DEPARTMENT_OTHER): Payer: Self-pay | Admitting: Urology

## 2012-03-07 ENCOUNTER — Other Ambulatory Visit: Payer: Self-pay | Admitting: Urology

## 2012-03-15 ENCOUNTER — Encounter (HOSPITAL_BASED_OUTPATIENT_CLINIC_OR_DEPARTMENT_OTHER): Payer: Self-pay | Admitting: *Deleted

## 2012-03-15 NOTE — Progress Notes (Signed)
To [name] at 1330-Istat,urine pregnancy on arrival-Npo after Mn-may have clear liquids until 0600,then Npo -will take coreg that am.

## 2012-03-20 ENCOUNTER — Ambulatory Visit (HOSPITAL_BASED_OUTPATIENT_CLINIC_OR_DEPARTMENT_OTHER)
Admission: RE | Admit: 2012-03-20 | Discharge: 2012-03-20 | Disposition: A | Discharge status: Home / Self Care | Payer: 59 | Source: Ambulatory Visit | Attending: Urology | Admitting: Urology

## 2012-03-20 ENCOUNTER — Encounter (HOSPITAL_BASED_OUTPATIENT_CLINIC_OR_DEPARTMENT_OTHER)
Admission: RE | Disposition: A | Discharge status: Home / Self Care | Payer: Self-pay | Source: Ambulatory Visit | Attending: Urology

## 2012-03-20 ENCOUNTER — Ambulatory Visit (HOSPITAL_BASED_OUTPATIENT_CLINIC_OR_DEPARTMENT_OTHER): Payer: 59 | Admitting: Anesthesiology

## 2012-03-20 ENCOUNTER — Encounter (HOSPITAL_BASED_OUTPATIENT_CLINIC_OR_DEPARTMENT_OTHER): Payer: Self-pay | Admitting: *Deleted

## 2012-03-20 ENCOUNTER — Encounter (HOSPITAL_BASED_OUTPATIENT_CLINIC_OR_DEPARTMENT_OTHER): Payer: Self-pay | Admitting: Anesthesiology

## 2012-03-20 DIAGNOSIS — I447 Left bundle-branch block, unspecified: Secondary | ICD-10-CM | POA: Insufficient documentation

## 2012-03-20 DIAGNOSIS — N1330 Unspecified hydronephrosis: Secondary | ICD-10-CM | POA: Insufficient documentation

## 2012-03-20 DIAGNOSIS — N200 Calculus of kidney: Secondary | ICD-10-CM

## 2012-03-20 DIAGNOSIS — N135 Crossing vessel and stricture of ureter without hydronephrosis: Secondary | ICD-10-CM | POA: Insufficient documentation

## 2012-03-20 DIAGNOSIS — E785 Hyperlipidemia, unspecified: Secondary | ICD-10-CM | POA: Insufficient documentation

## 2012-03-20 HISTORY — PX: HOLMIUM LASER APPLICATION: SHX5852

## 2012-03-20 HISTORY — PX: CYSTOSCOPY WITH RETROGRADE PYELOGRAM, URETEROSCOPY AND STENT PLACEMENT: SHX5789

## 2012-03-20 LAB — POCT I-STAT 4, (NA,K, GLUC, HGB,HCT)
Hemoglobin: 12.9 g/dL (ref 12.0–15.0)
Sodium: 142 mEq/L (ref 135–145)

## 2012-03-20 SURGERY — CYSTOURETEROSCOPY, WITH RETROGRADE PYELOGRAM AND STENT INSERTION
Anesthesia: General | Site: Ureter | Laterality: Left | Wound class: Clean Contaminated

## 2012-03-20 MED ORDER — GLYCOPYRROLATE 0.2 MG/ML IJ SOLN
INTRAMUSCULAR | Status: DC | PRN
  Administered 2012-03-20: 0.2 mg via INTRAVENOUS

## 2012-03-20 MED ORDER — CIPROFLOXACIN HCL 500 MG PO TABS: 500.0000 mg | ORAL_TABLET | Freq: Two times a day (BID) | ORAL | Status: DC

## 2012-03-20 MED ORDER — FENTANYL CITRATE 0.05 MG/ML IJ SOLN
INTRAMUSCULAR | Status: DC | PRN
  Administered 2012-03-20 (×4): 25 ug via INTRAVENOUS
  Administered 2012-03-20: 50 ug via INTRAVENOUS
  Administered 2012-03-20: 25 ug via INTRAVENOUS
  Administered 2012-03-20: 50 ug via INTRAVENOUS
  Administered 2012-03-20 (×3): 25 ug via INTRAVENOUS

## 2012-03-20 MED ORDER — MIDAZOLAM HCL 5 MG/5ML IJ SOLN
INTRAMUSCULAR | Status: DC | PRN
  Administered 2012-03-20: 2 mg via INTRAVENOUS

## 2012-03-20 MED ORDER — SODIUM CHLORIDE 0.9 % IR SOLN
Status: DC | PRN
  Administered 2012-03-20: 6000 mL

## 2012-03-20 MED ORDER — OXYCODONE-ACETAMINOPHEN 5-325 MG PO TABS: 1.0000 | ORAL_TABLET | ORAL | Status: DC | PRN

## 2012-03-20 MED ORDER — PROMETHAZINE HCL 25 MG/ML IJ SOLN
6.2500 mg | INTRAMUSCULAR | Status: DC | PRN
  Administered 2012-03-20: 3.1 mg via INTRAVENOUS
  Filled 2012-03-20: qty 1

## 2012-03-20 MED ORDER — DEXAMETHASONE SODIUM PHOSPHATE 4 MG/ML IJ SOLN
INTRAMUSCULAR | Status: DC | PRN
  Administered 2012-03-20: 8 mg via INTRAVENOUS

## 2012-03-20 MED ORDER — LIDOCAINE HCL (CARDIAC) 20 MG/ML IV SOLN
INTRAVENOUS | Status: DC | PRN
  Administered 2012-03-20: 50 mg via INTRAVENOUS

## 2012-03-20 MED ORDER — LACTATED RINGERS IV SOLN
INTRAVENOUS | Status: DC
  Administered 2012-03-20 (×2): via INTRAVENOUS
  Filled 2012-03-20: qty 1000

## 2012-03-20 MED ORDER — LACTATED RINGERS IV SOLN
INTRAVENOUS | Status: DC
  Administered 2012-03-20: 20:00:00 via INTRAVENOUS
  Filled 2012-03-20: qty 1000

## 2012-03-20 MED ORDER — CIPROFLOXACIN IN D5W 400 MG/200ML IV SOLN
400.0000 mg | INTRAVENOUS | Status: AC
  Administered 2012-03-20: 400 mg via INTRAVENOUS
  Filled 2012-03-20: qty 200

## 2012-03-20 MED ORDER — HYDROMORPHONE HCL PF 1 MG/ML IJ SOLN
0.2500 mg | INTRAMUSCULAR | Status: DC | PRN
  Administered 2012-03-20 (×3): 0.5 mg via INTRAVENOUS
  Filled 2012-03-20: qty 1

## 2012-03-20 MED ORDER — IOHEXOL 350 MG/ML SOLN
INTRAVENOUS | Status: DC | PRN
  Administered 2012-03-20: 10 mL

## 2012-03-20 MED ORDER — PHENAZOPYRIDINE HCL 100 MG PO TABS: 200.0000 mg | ORAL_TABLET | Freq: Three times a day (TID) | ORAL | Status: DC | PRN

## 2012-03-20 MED ORDER — ONDANSETRON HCL 4 MG/2ML IJ SOLN
INTRAMUSCULAR | Status: DC | PRN
  Administered 2012-03-20: 4 mg via INTRAVENOUS

## 2012-03-20 MED ORDER — BELLADONNA ALKALOIDS-OPIUM 16.2-60 MG RE SUPP
RECTAL | Status: DC | PRN
  Administered 2012-03-20: 1 via RECTAL

## 2012-03-20 MED ORDER — PROPOFOL 10 MG/ML IV BOLUS
INTRAVENOUS | Status: DC | PRN
  Administered 2012-03-20: 200 mg via INTRAVENOUS

## 2012-03-20 MED ORDER — KETOROLAC TROMETHAMINE 30 MG/ML IJ SOLN
INTRAMUSCULAR | Status: DC | PRN
  Administered 2012-03-20: 30 mg via INTRAVENOUS

## 2012-03-20 MED ORDER — SUCCINYLCHOLINE CHLORIDE 20 MG/ML IJ SOLN
INTRAMUSCULAR | Status: DC | PRN
  Administered 2012-03-20: 60 mg via INTRAVENOUS

## 2012-03-20 SURGICAL SUPPLY — 39 items
ADAPTER CATH URET PLST 4-6FR (CATHETERS) IMPLANT
BAG DRAIN URO-CYSTO SKYTR STRL (DRAIN) ×2 IMPLANT
BASKET LASER NITINOL 1.9FR (BASKET) IMPLANT
BASKET STNLS GEMINI 4WIRE 3FR (BASKET) IMPLANT
BASKET ZERO TIP NITINOL 2.4FR (BASKET) ×2 IMPLANT
BRUSH URET BIOPSY 3F (UROLOGICAL SUPPLIES) IMPLANT
CANISTER SUCT LVC 12 LTR MEDI- (MISCELLANEOUS) ×2 IMPLANT
CATH CLEAR GEL 3F BACKSTOP (CATHETERS) IMPLANT
CATH INTERMIT  6FR 70CM (CATHETERS) ×2 IMPLANT
CATH URET 5FR 28IN CONE TIP (BALLOONS)
CATH URET 5FR 28IN OPEN ENDED (CATHETERS) IMPLANT
CATH URET 5FR 70CM CONE TIP (BALLOONS) IMPLANT
CATH URET DUAL LUMEN 6-10FR 50 (CATHETERS) IMPLANT
CLOTH BEACON ORANGE TIMEOUT ST (SAFETY) ×2 IMPLANT
DRAPE CAMERA CLOSED 9X96 (DRAPES) ×2 IMPLANT
ELECT REM PT RETURN 9FT ADLT (ELECTROSURGICAL)
ELECTRODE REM PT RTRN 9FT ADLT (ELECTROSURGICAL) IMPLANT
GLOVE BIO SURGEON STRL SZ7 (GLOVE) ×2 IMPLANT
GLOVE BIOGEL M 6.5 STRL (GLOVE) ×2 IMPLANT
GLOVE ECLIPSE 6.0 STRL STRAW (GLOVE) ×2 IMPLANT
GLOVE ECLIPSE 7.0 STRL STRAW (GLOVE) ×2 IMPLANT
GLOVE INDICATOR 7.5 STRL GRN (GLOVE) ×2 IMPLANT
GOWN PREVENTION PLUS LG XLONG (DISPOSABLE) ×2 IMPLANT
GUIDEWIRE 0.038 PTFE COATED (WIRE) IMPLANT
GUIDEWIRE ANG ZIPWIRE 038X150 (WIRE) ×2 IMPLANT
GUIDEWIRE STR DUAL SENSOR (WIRE) ×4 IMPLANT
IV NS IRRIG 3000ML ARTHROMATIC (IV SOLUTION) ×4 IMPLANT
KIT BALLIN UROMAX 15FX10 (LABEL) IMPLANT
KIT BALLN UROMAX 15FX4 (MISCELLANEOUS) ×1 IMPLANT
KIT BALLN UROMAX 26 75X4 (MISCELLANEOUS) ×1
LASER FIBER DISP (UROLOGICAL SUPPLIES) ×2 IMPLANT
PACK CYSTOSCOPY (CUSTOM PROCEDURE TRAY) ×2 IMPLANT
SET HIGH PRES BAL DIL (LABEL)
SHEATH ACCESS URETERAL 38CM (SHEATH) ×2 IMPLANT
SHEATH ACCESS URETERAL 54CM (SHEATH) IMPLANT
SHEATH URET ACCESS 12FR/35CM (UROLOGICAL SUPPLIES) IMPLANT
SHEATH URET ACCESS 12FR/55CM (UROLOGICAL SUPPLIES) IMPLANT
STENT URET 6FRX24 CONTOUR (STENTS) ×2 IMPLANT
SYRINGE IRR TOOMEY STRL 70CC (SYRINGE) ×2 IMPLANT

## 2012-03-20 NOTE — Anesthesia Postprocedure Evaluation (Signed)
  Anesthesia Post-op Note  Patient: Kendra [name]  Procedure(s) Performed: Procedure(s) (LRB): CYSTOSCOPY WITH RETROGRADE PYELOGRAM, URETEROSCOPY AND STENT PLACEMENT (Left) HOLMIUM LASER APPLICATION (Left)  Patient Location: PACU  Anesthesia Type: General  Level of Consciousness: awake and alert   Airway and Oxygen Therapy: Patient Spontanous Breathing  Post-op Pain: mild  Post-op Assessment: Post-op Vital signs reviewed, Patient's Cardiovascular Status Stable, Respiratory Function Stable, Patent Airway and No signs of Nausea or vomiting  Last Vitals:  Filed Vitals:   03/20/12 1347  BP: 139/87  Pulse: 74  Temp: 37.1 C  Resp: 18    Post-op Vital Signs: stable   Complications: No apparent anesthesia complications

## 2012-03-20 NOTE — Transfer of Care (Signed)
Immediate Anesthesia Transfer of Care Note  Patient: Kendra [name]  Procedure(s) Performed: Procedure(s) (LRB): CYSTOSCOPY WITH RETROGRADE PYELOGRAM, URETEROSCOPY AND STENT PLACEMENT (Left) HOLMIUM LASER APPLICATION (Left)  Patient Location: PACU  Anesthesia Type: General  Level of Consciousness: awake, oriented, sedated and patient cooperative  Airway & Oxygen Therapy: Patient Spontanous Breathing and Patient connected to face mask oxygen  Post-op Assessment: Report given to PACU RN and Post -op Vital signs reviewed and stable  Post vital signs: Reviewed and stable  Complications: No apparent anesthesia complications

## 2012-03-20 NOTE — Anesthesia Procedure Notes (Signed)
Procedure Name: LMA Insertion Date/Time: 03/20/2012 3:28 PM Performed by: Renella Cunas D Pre-anesthesia Checklist: Patient identified, Emergency Drugs available, Suction available and Patient being monitored Patient Re-evaluated:Patient Re-evaluated prior to inductionOxygen Delivery Method: Circle System Utilized Preoxygenation: Pre-oxygenation with 100% oxygen Intubation Type: IV induction Ventilation: Mask ventilation without difficulty LMA: LMA inserted LMA Size: 4.0 Number of attempts: 1 Airway Equipment and Method: bite block Placement Confirmation: positive ETCO2 Tube secured with: Tape Dental Injury: Teeth and Oropharynx as per pre-operative assessment

## 2012-03-20 NOTE — Op Note (Signed)
Urology Operative Report  Date of Procedure: 03/20/12  Surgeon: Natalia Leatherwood, MD Assistant: None  Preoperative Diagnosis: Left nephrolithiasis.  Postoperative Diagnosis:  Same, Left ureter stenosis  Procedure(s): Ureteroscopy with laser lithotripsy Left retrograde pyelogram with interpretation Left ureter stent removal Left ureter stent placement Cystoscopy  Estimated blood loss: Minimal  Specimen: Stone fragments sent to [facility] lab for chemical analysis.  Drains: None  Complications: None  Findings: This patient had significant left ureter narrowing throughout the ureter requiring balloon dilation. She also had a large left lower pole stone.  History of present illness: 42 year old female presents today with left nephrolithiasis. She's had a chronic left lower pole stone. It was a size that the figure much larger it would have been difficult to remove endoscopically. She presents today for interval ureteroscopy after having a left ureter stent placed earlier this month after attempt at ureteroscopy.   Procedure in detail: After informed consent was obtained, the patient was taken to the operating room. They were placed in the supine position. SCDs were turned on and in place. IV antibiotics were infused, and general anesthesia was induced. A timeout was performed in which the correct patient, surgical site, and procedure were identified and agreed upon by the team. A belladonna and opium suppository was placed into her rectum.  The patient was placed in a dorsolithotomy position, making sure to pad all pertinent neurovascular pressure points. The genitals were prepped and draped in the usual sterile fashion.  I pass the cystoscope through the urethra and into the bladder. The bladder was fully distended and evaluated in a systematic fashion. There were no bladder tumors noted. I noted that the ureter stent appeared to be protruding from the ureter orifice further than I expected.  Fluoroscopy showed that the ureter stent had migrated distally, but the proximal end of the stent was still in the proximal portion of the ureter on the left side. I attempted to place a sensor tip wire side the ureter stent, but this was unsuccessful. I therefore used a stent grasper to remove the stent. I then cannulated the left ureter orifice with a 5 [place] ureter catheter and injected contrast. There was no extravasation and the area in the mid ureter did not appear to be especially narrow. I again attempted to place a sensor tip wire, but I could not pass this beyond an area in the mid ureter. This appeared to be around L3-L4. I again obtain a left retrograde pyelogram and I did not notice any significant stenosis. An angle-tipped Glidewire was then placed up beyond this point and into the left renal pelvis. A 5 French ureter catheter was able to be placed over this and the left renal pelvis with ease. I exchanged the Glidewire for a sensor tip wire. I again placed the Glidewire beyond this area in the mid ureter up into the left renal pelvis and placed a 5 French ureter catheter over this with ease. The Glidewire was then exchanged for a second sensor tip wire. I secured 1 wire to the drape as a safety wire.  Next I attempted to place a semirigid ureteroscope Into the ureter to see if I could visualize the area of narrowing as her ureter was very straight from her bladder to the area of narrowing. She was paralyzed and then I attempted to do this, but I encountered an area of stenosis in the distal ureter which I was not able to pass with the semirigid ureter scope. I attempted to place  the obturator to a ureter access sheath through this area but this was unsuccessful as well. I therefore elected to balloon dilate this area as the patient had wished me to proceed with dilation if needed. This was done over the working wire with contrast in the balloon under fluoroscopy. Distal area was dilated and then  the operator and access sheath of a 12/14 ureter access sheath was placed. I was not able to place this beyond the area of the pelvic brim. Again, an area of stenosis was encountered requiring balloon dilation. Dilations were dilated to 10 atmospheres and held for several minutes. After this was done I was able to reach the area which was difficult forma wires to pass. I visualized this area with a flexible digital ureter scope and found an area of stenosis that I was not able to pass beyond with the scope. I dilated this area again over a working wire under fluoroscopy with the balloon. Once this was completed there was another area of stenosis close to the ureteropelvic junction it required balloon dilating ; this was done as previously mentioned. Finally I was able to pass the ureter access sheath into the renal pelvis. The obturator and working wire were removed. The flexible digital ureter scope was then placed and I was able to visualize the left lower pole stone. The rest the kidney was evaluated in a systematic fashion. There were noted to be some areas of calyceal stenosis but no other stones. The stone was too large in the lower pole to remove and therefore I used a holmium laser wire which was 200  in size and performed lithotripsy at a setting of 0.5 J and 20 Hz. After that was complete, I used the 0 tip Nitinol basket to remove the fragments. This required several sequences of lithotripsy with the holmium laser. After all of the fragments that were large enough to be grasped with the basket were removed I then withdrew the ureter access sheath along with the digital ureter scope and visualized the entire ureter. There was an area of mucosal disruption near the area that had been dilated in the mid ureter that could not be passed previously with the wire. The rest of the ureter was inflamed but the mucosa was intact.  I then loaded the safety wire through a rigid cystoscope and placed a 6 x 24  double-J ureter stent up the wire under fluoroscopy with ease. A good curl was noted in the left renal pelvis and a curl was noted in the bladder. The bladder was drained and 10 cc of lidocaine jelly were placed into her urethra. The strings of the ureter stent were removed.  I will reschedule her appointment for next week to see me as her left ureter stent will need to remain in place for a longer period of time than we had previously expected.  Anesthesia was reversed, she was placed in a supine position, and taken to the PACU in stable condition. Her stone fragments were removed and taken to [facility] lab for chemical analysis.

## 2012-03-20 NOTE — H&P (Signed)
Urology History and Physical Exam  CC: Left nephrolithiasis  HPI: 42 year old female presents today with left nephrolithiasis. This is been a chronic problem. The stone is approximately 9 mm in size. It is located in the lower pole of her left kidney. Attempted ureteroscopy to treat the stone on 03/06/12 was unsuccessful due to distal ureter stenosis. A left ureter stent was placed at that time with plans to return for interval ureteroscopy. She presents today for cystoscopy, left retrograde pyelogram, left ureteroscopy, laser lithotripsy, left ureter stent exchange. We have discussed the risk, benefits, alternatives, and likelihood of achieving goals. Urine culture 03/11/12 was negative for growth.  PMH: Past Medical History  Diagnosis Date  . Depression   . Hyperlipidemia   . Chronic headache   . Kidney stones   . Former smoker     quit in 61  . Left bundle branch block   . Systolic dysfunction, left ventricle     EF is 43% per Myoview; dyssynchrony of the septum noted on echo; has LBBB  . Obesity     PSH: Past Surgical History  Procedure Laterality Date  . Wrist surgery  11/2001  . Foot surgery  12/2005  . Cholecystectomy  2009  . Tonsillectomy  03/2010  . Cystoscopy with retrograde pyelogram, ureteroscopy and stent placement  03/06/2012    Procedure: CYSTOSCOPY WITH RETROGRADE PYELOGRAM, URETEROSCOPY AND STENT PLACEMENT;  Surgeon: Milford Cage, MD;  Location: [facility];  Service: Urology;  Laterality: Left;    Allergies: Allergies  Allergen Reactions  . Codeine Nausea And Vomiting  . Penicillins     REACTION: hives    Medications: No prescriptions prior to admission     Social History: History   Social History  . Marital Status: Divorced    Spouse Name: N/A    Number of Children: N/A  . Years of Education: N/A   Occupational History  . [employer]    Social History Main Topics  . Smoking status: Former Smoker    Quit date:  01/31/1991  . Smokeless tobacco: Never Used  . Alcohol Use: Yes  . Drug Use: No  . Sexually Active: Not on file   Other Topics Concern  . Not on file   Social History Narrative   Daily caffeine use:  Sweet Tea daily      Patient does not get regular exercise    Family History: Family History  Problem Relation Age of Onset  . Hyperlipidemia Mother   . Hyperlipidemia Brother   . Hyperlipidemia Brother     Review of Systems: Positive: Frequency, gross hematuria. Negative: Fever, SOB, or chest pain.  A further 10 point review of systems was negative except what is listed in the HPI.  Physical Exam: Filed Vitals:   03/20/12 1347  BP: 139/87  Pulse: 74  Temp: 98.7 F (37.1 C)  Resp: 18    General: No acute distress.  Awake. Head:  Normocephalic.  Atraumatic. ENT:  EOMI.  Mucous membranes moist Neck:  Supple.  No lymphadenopathy. CV:  S1 present. S2 present. Regular rate. Pulmonary: Equal effort bilaterally.  Clear to auscultation bilaterally. Abdomen: Soft.  Non- tender to palpation. Skin:  Normal turgor.  No visible rash. Extremity: No gross deformity of bilateral upper extremities.  No gross deformity of    bilateral lower extremities. Neurologic: Alert. Appropriate mood.    Studies:  No results found for this basename: HGB, WBC, PLT,  in the last 72 hours  No  results found for this basename: NA, K, CL, CO2, BUN, CREATININE, CALCIUM, MAGNESIUM, GFRNONAA, GFRAA,  in the last 72 hours   No results found for this basename: PT, INR, APTT,  in the last 72 hours   No components found with this basename: ABG,     Assessment:  Left nephrolithiasis  Plan: To the OR  for cystoscopy, left retrograde pyelogram, left ureteroscopy, laser lithotripsy, left ureter stent exchange.

## 2012-03-20 NOTE — Anesthesia Preprocedure Evaluation (Addendum)
Anesthesia Evaluation  Patient identified by MRN, date of birth, ID band Patient awake    Reviewed: Allergy & Precautions, H&P , NPO status , Patient's Chart, lab work & pertinent test results, reviewed documented beta blocker date and time   Airway Mallampati: I TM Distance: >3 FB Neck ROM: Full    Dental  (+) Dental Advisory Given and Teeth Intact   Pulmonary former smoker,  breath sounds clear to auscultation  Pulmonary exam normal       Cardiovascular hypertension, Pt. on medications and Pt. on home beta blockers +CHF - dysrhythmias Rhythm:Regular Rate:Normal     Neuro/Psych  Headaches, Depression    GI/Hepatic negative GI ROS, Neg liver ROS,   Endo/Other  negative endocrine ROS  Renal/GU Renal diseasenegative Renal ROS     Musculoskeletal negative musculoskeletal ROS (+)   Abdominal (+) + obese,   Peds  Hematology negative hematology ROS (+)   Anesthesia Other Findings   Reproductive/Obstetrics negative OB ROS                          Anesthesia Physical Anesthesia Plan  ASA: II  Anesthesia Plan: General   Post-op Pain Management:    Induction: Intravenous  Airway Management Planned: LMA  Additional Equipment:   Intra-op Plan:   Post-operative Plan: Extubation in OR  Informed Consent: I have reviewed the patients History and Physical, chart, labs and discussed the procedure including the risks, benefits and alternatives for the proposed anesthesia with the patient or authorized representative who has indicated his/her understanding and acceptance.   Dental advisory given  Plan Discussed with: CRNA  Anesthesia Plan Comments:         Anesthesia Quick Evaluation  

## 2012-03-21 ENCOUNTER — Encounter (HOSPITAL_BASED_OUTPATIENT_CLINIC_OR_DEPARTMENT_OTHER): Payer: Self-pay | Admitting: Urology

## 2012-03-29 ENCOUNTER — Ambulatory Visit (INDEPENDENT_AMBULATORY_CARE_PROVIDER_SITE_OTHER): Payer: 59 | Admitting: Cardiovascular Disease

## 2012-03-29 ENCOUNTER — Encounter: Payer: Self-pay | Admitting: Cardiovascular Disease

## 2012-03-29 VITALS — BP 108/76 | HR 69 | Ht 70.0 in | Wt 214.0 lb

## 2012-03-29 DIAGNOSIS — I447 Left bundle-branch block, unspecified: Secondary | ICD-10-CM

## 2012-03-29 NOTE — Patient Instructions (Addendum)
Your physician wants you to follow-up in: 6 months You will receive a reminder letter in the mail two months in advance. If you don't receive a letter, please call our office to schedule the follow-up appointment.  Your physician recommends that you return for a FASTING lipid profile: 6 months   Your physician recommends that you continue on your current medications as directed. Please refer to the Current Medication list given to you today.  

## 2012-03-29 NOTE — Progress Notes (Signed)
Kendra [name] Date of Birth  [birth year] [facility]     [place] [facility]  [address]    [address]   [address] [place], [place]  [address]    [place], [place]  [address] [phone]  Fax  [phone]  [phone]  Fax [phone]  Problem List: 1. Left bundle branch block 2. Chest pain 3. Kidney stone  History of Present Illness:  Kendra [name] is a 42 year old female. She presented to her medical Dr. with some upper left-sided abdominal pain/breast pain.  This pain is occasionally pleuritic. She was thought to have a pulmonary illness. A d-dimer was subsequent on and found to be negative the An EKG showed left bundle branch block.   A subsequent echocardiogram revealed dyssynchrony of her septum.  She does not exercise on regular basis. She walks occasionally. She's been in the process of moving. She has noticed that she's a little bit more short of breath moving boxes. She's also noticed some left arm tingling and numbness which radiates down to her fingers.  We performed a Lexiscan Myoview which revealed an EF of 43%.  Overall Impression: Low risk stress nuclear study. No ischemia or infarct. Baseline ECG LBBB with decreased EF  LV Ejection Fraction: 43%. LV Wall Motion: Apical hypokinesis  We initially had her on Toprol-XL but she did not tolerate it. She felt very lethargic. We now have her on carvedilol which he seems to tolerate much better.  She's exercising on occasion.  She has been measuring her BP on occasion.  Her readings have been ok but several have been elevated.  Feb. 27, 2014:  She has had several kidney surgeries since I last saw her.  She had some chest pressure when she woke up for anesthesia. She has had some bad indigestion since then.  Better with antiacids.  She has not been exercising much for the past month.  Her BP has been well controlled.    Current Outpatient Prescriptions on File Prior to Visit  Medication Sig Dispense Refill   . carvedilol (COREG) 6.25 MG tablet Take 1 tablet (6.25 mg total) by mouth 2 (two) times daily.  60 tablet  11  . hyoscyamine (LEVSIN, ANASPAZ) 0.125 MG tablet Take 1 tablet (0.125 mg total) by mouth every 4 (four) hours as needed for cramping (bladder spasms).  40 tablet  4  . Multiple Vitamin (MULTIVITAMIN) tablet Take 1 tablet by mouth daily.        Marland Kitchen oxybutynin (DITROPAN) 5 MG tablet Take 1 tablet (5 mg total) by mouth every 6 (six) hours as needed (bladder spasms).  40 tablet  4  . oxyCODONE-acetaminophen (PERCOCET) 5-325 MG per tablet Take 1-2 tablets by mouth every 4 (four) hours as needed for pain.  60 tablet  0  . phenazopyridine (PYRIDIUM) 100 MG tablet Take 2 tablets (200 mg total) by mouth every 8 (eight) hours as needed for pain (Burning urination.  Will turn urine and body fluids orange.).  30 tablet  3   No current facility-administered medications on file prior to visit.    Allergies  Allergen Reactions  . Codeine Nausea And Vomiting  . Penicillins     REACTION: hives    Past Medical History  Diagnosis Date  . Depression   . Hyperlipidemia   . Chronic headache   . Kidney stones   . Former smoker     quit in 26  . Left bundle branch block   . Systolic dysfunction, left  ventricle     EF is 43% per Myoview; dyssynchrony of the septum noted on echo; has LBBB  . Obesity     Past Surgical History  Procedure Laterality Date  . Wrist surgery  11/2001  . Foot surgery  12/2005  . Cholecystectomy  2009  . Tonsillectomy  03/2010  . Cystoscopy with retrograde pyelogram, ureteroscopy and stent placement  03/06/2012    Procedure: CYSTOSCOPY WITH RETROGRADE PYELOGRAM, URETEROSCOPY AND STENT PLACEMENT;  Surgeon: Milford Cage, MD;  Location: [facility];  Service: Urology;  Laterality: Left;  . Cystoscopy with retrograde pyelogram, ureteroscopy and stent placement Left 03/20/2012    Procedure: CYSTOSCOPY WITH RETROGRADE PYELOGRAM, URETEROSCOPY AND STENT  PLACEMENT;  Surgeon: Milford Cage, MD;  Location: [facility];  Service: Urology;  Laterality: Left;  balloon dilitation  . Holmium laser application Left 03/20/2012    Procedure: HOLMIUM LASER APPLICATION;  Surgeon: Milford Cage, MD;  Location: [facility];  Service: Urology;  Laterality: Left;    History  Smoking status  . Former Smoker  . Quit date: 01/31/1991  Smokeless tobacco  . Never Used    History  Alcohol Use  . Yes    Family History  Problem Relation Age of Onset  . Hyperlipidemia Mother   . Hyperlipidemia Brother   . Hyperlipidemia Brother     Reviw of Systems:  Reviewed in the HPI.  All other systems are negative.  Physical Exam: Blood pressure 108/76, pulse 69, height 5\' 10"  (1.778 m), weight 214 lb (97.07 kg), SpO2 98.00%. General: Well developed, well nourished, in no acute distress.  Head: Normocephalic, atraumatic, sclera non-icteric, mucus membranes are moist,   Neck: Supple. Carotids are 2 + without bruits. No JVD  Lungs: Clear bilaterally to auscultation.  Heart: regular rate.  normal  S1 S2. No murmurs, gallops or rubs.  Abdomen: Soft, non-tender, non-distended with normal bowel sounds. No hepatomegaly. No rebound/guarding. No masses.  Msk:  Strength and tone are normal  Extremities: No clubbing or cyanosis. No edema.  Distal pedal pulses are 2+ and equal bilaterally.  Neuro: Alert and oriented X 3. Moves all extremities spontaneously.  Psych:  Responds to questions appropriately with a normal affect.  ECG:   Assessment / Plan:

## 2012-03-29 NOTE — Assessment & Plan Note (Addendum)
Kendra [name] is doing fairly well. She has had some indigestion and some tightness but I think these symptoms are related to her anesthesia and her ureteral stent placement.  I've encouraged her to exercise on a regular basis. She is scheduled for 1/walk 5K race in a month or so. I will see her again in 6 months for an office visit and EKG.

## 2012-04-09 ENCOUNTER — Telehealth: Payer: Self-pay | Admitting: *Deleted

## 2012-04-09 NOTE — Telephone Encounter (Signed)
received paperwork for [employer], paperwork taken to MR to be completed.

## 2012-04-11 ENCOUNTER — Encounter: Payer: Self-pay | Admitting: *Deleted

## 2012-06-17 ENCOUNTER — Encounter: Payer: Self-pay | Admitting: Gastroenterology

## 2012-06-17 ENCOUNTER — Ambulatory Visit (INDEPENDENT_AMBULATORY_CARE_PROVIDER_SITE_OTHER): Payer: 59 | Admitting: Gastroenterology

## 2012-06-17 VITALS — BP 132/78 | HR 70 | Ht 70.0 in | Wt 217.0 lb

## 2012-06-17 DIAGNOSIS — R197 Diarrhea, unspecified: Secondary | ICD-10-CM

## 2012-06-17 DIAGNOSIS — G8929 Other chronic pain: Secondary | ICD-10-CM

## 2012-06-17 DIAGNOSIS — R1013 Epigastric pain: Secondary | ICD-10-CM

## 2012-06-17 MED ORDER — CHOLESTYRAMINE 4 G PO PACK: 1.0000 | PACK | Freq: Every day | ORAL | Status: DC

## 2012-06-17 NOTE — Patient Instructions (Addendum)
Trial of cholestryamine powder, take once daily. Call to report on your symptoms in 3-4 weeks.

## 2012-06-17 NOTE — Progress Notes (Signed)
HPI: This is a    very pleasant 42 year old woman whom I last saw many years ago.   She had EGD in 2007, colonoscopy around then and flex sig more recently.   Has been having pain and chronic diarrhea.  Pain for 3 years, diarrhea (loose, watery) for about three years or maybe around the time her GB removed in 2007.  She has brisk gastrocolic reflex. She has tried imodium briefly.  Didn't work well.  4-5 times per day loose stools.  More importantly she is bothered by pains.  Awoke several months ago with left sided abdominal pains.  Right under her ribcage.  Daily pains.  Can also have pains in right side.   Takes infrequent NSAIDs.  Overall her weight has been stable.  No overt GI bleeding.  Recent kidney stone surgery (feb 2014), still has pains.  Review of systems: Pertinent positive and negative review of systems were noted in the above HPI section. Complete review of systems was performed and was otherwise normal.    Past Medical History  Diagnosis Date  . Depression   . Hyperlipidemia   . Chronic headache   . Kidney stones   . Former smoker     quit in 25  . Left bundle branch block   . Systolic dysfunction, left ventricle     EF is 43% per Myoview; dyssynchrony of the septum noted on echo; has LBBB  . Obesity     Past Surgical History  Procedure Laterality Date  . Wrist surgery  11/2001  . Foot surgery  12/2005  . Cholecystectomy  2009  . Tonsillectomy  03/2010  . Cystoscopy with retrograde pyelogram, ureteroscopy and stent placement  03/06/2012    Procedure: CYSTOSCOPY WITH RETROGRADE PYELOGRAM, URETEROSCOPY AND STENT PLACEMENT;  Surgeon: Milford Cage, MD;  Location: [facility];  Service: Urology;  Laterality: Left;  . Cystoscopy with retrograde pyelogram, ureteroscopy and stent placement Left 03/20/2012    Procedure: CYSTOSCOPY WITH RETROGRADE PYELOGRAM, URETEROSCOPY AND STENT PLACEMENT;  Surgeon: Milford Cage, MD;  Location:  [facility];  Service: Urology;  Laterality: Left;  balloon dilitation  . Holmium laser application Left 03/20/2012    Procedure: HOLMIUM LASER APPLICATION;  Surgeon: Milford Cage, MD;  Location: [facility];  Service: Urology;  Laterality: Left;    Current Outpatient Prescriptions  Medication Sig Dispense Refill  . carvedilol (COREG) 6.25 MG tablet Take 1 tablet (6.25 mg total) by mouth 2 (two) times daily.  60 tablet  11  . Multiple Vitamin (MULTIVITAMIN) tablet Take 1 tablet by mouth daily.         No current facility-administered medications for this visit.    Allergies as of 06/17/2012 - Review Complete 06/17/2012  Allergen Reaction Noted  . Codeine Nausea And Vomiting 03/25/2007  . Penicillins  03/25/2007    Family History  Problem Relation Age of Onset  . Hyperlipidemia Mother   . Hyperlipidemia Brother   . Hyperlipidemia Brother   . Colon cancer Neg Hx     History   Social History  . Marital Status: Divorced    Spouse Name: N/A    Number of Children: N/A  . Years of Education: N/A   Occupational History  . [employer]    Social History Main Topics  . Smoking status: Former Smoker    Quit date: 01/31/1991  . Smokeless tobacco: Never Used  . Alcohol Use: Yes  . Drug Use: No  . Sexually  Active: Not on file   Other Topics Concern  . Not on file   Social History Narrative   Daily caffeine use:  Sweet Tea daily      Patient does not get regular exercise       Physical Exam: BP 132/78  Pulse 70  Ht 5\' 10"  (1.778 m)  Wt 217 lb (98.431 kg)  BMI 31.14 kg/m2 Constitutional: generally well-appearing Psychiatric: alert and oriented x3 Eyes: extraocular movements intact Mouth: oral pharynx moist, no lesions Neck: supple no lymphadenopathy Cardiovascular: heart regular rate and rhythm Lungs: clear to auscultation bilaterally Abdomen: soft, nontender, nondistended, no obvious ascites, no peritoneal signs, normal  bowel sounds Extremities: no lower extremity edema bilaterally Skin: no lesions on visible extremities    Assessment and plan: 42 y.o. female with  chronic loose stools, intermittent epigastric pains  Her loose stools clearly started around the time her gallbladder was removed. I suspect these are related to bile acid dysregulation and I'm going to try her on cholestyramine powder once daily. I hope that some of her upper GI symptoms are also bile acid related such as bile gastritis. She will call to report on her symptoms both upper and lower in 3-4 weeks and we will take it from there.

## 2012-07-19 ENCOUNTER — Telehealth: Payer: Self-pay | Admitting: Cardiovascular Disease

## 2012-07-19 NOTE — Telephone Encounter (Signed)
New Problem:    Called in requesting a refill of the patient's carvedilol (COREG) 6.25 MG tablet.  Please call back.

## 2012-07-22 ENCOUNTER — Other Ambulatory Visit: Payer: Self-pay | Admitting: *Deleted

## 2012-07-22 DIAGNOSIS — I454 Nonspecific intraventricular block: Secondary | ICD-10-CM

## 2012-07-22 MED ORDER — CARVEDILOL 6.25 MG PO TABS: 6.2500 mg | ORAL_TABLET | Freq: Two times a day (BID) | ORAL | Status: DC

## 2012-07-22 NOTE — Telephone Encounter (Signed)
Fax Received. Refill Completed. Kendra [name] (R.M.A)   

## 2012-08-09 ENCOUNTER — Telehealth: Payer: Self-pay | Admitting: Gastroenterology

## 2012-08-09 NOTE — Telephone Encounter (Signed)
Pt feels better but the abd pain is still present, 2 bowel movements daily.  She would also like to have a pilll form of Questran.  Please advise

## 2012-08-09 NOTE — Telephone Encounter (Signed)
Left message on machine to call back  

## 2012-08-13 NOTE — Telephone Encounter (Signed)
Left message on machine to call back  

## 2012-08-13 NOTE — Telephone Encounter (Signed)
There is no pill form of the [place].  ROV in 4-5 weeks to discuss her symptoms

## 2012-08-14 NOTE — Telephone Encounter (Signed)
The patient has been notified of this information and all questions answered. ROV scheduled

## 2012-09-16 ENCOUNTER — Ambulatory Visit: Payer: 59 | Admitting: Gastroenterology

## 2012-10-03 ENCOUNTER — Other Ambulatory Visit: Payer: Self-pay

## 2012-10-03 DIAGNOSIS — Z1231 Encounter for screening mammogram for malignant neoplasm of breast: Secondary | ICD-10-CM

## 2012-10-04 ENCOUNTER — Telehealth: Payer: Self-pay | Admitting: Cardiovascular Disease

## 2012-10-04 ENCOUNTER — Encounter: Payer: Self-pay | Admitting: *Deleted

## 2012-10-04 NOTE — Telephone Encounter (Signed)
Pt didn't get anything at last visit re mychart and was to get in her chart, pls call

## 2012-10-04 NOTE — Telephone Encounter (Signed)
Pt called and informed that I will mail one to here. Pt agreed to plan.

## 2012-10-11 ENCOUNTER — Ambulatory Visit: Payer: 59 | Admitting: Cardiovascular Disease

## 2012-11-01 ENCOUNTER — Ambulatory Visit
Admission: RE | Admit: 2012-11-01 | Discharge: 2012-11-01 | Disposition: A | Discharge status: Home / Self Care | Payer: BC Managed Care – PPO | Source: Ambulatory Visit

## 2012-11-01 DIAGNOSIS — Z1231 Encounter for screening mammogram for malignant neoplasm of breast: Secondary | ICD-10-CM

## 2012-11-04 ENCOUNTER — Encounter: Payer: Self-pay | Admitting: *Deleted

## 2012-12-09 ENCOUNTER — Encounter (INDEPENDENT_AMBULATORY_CARE_PROVIDER_SITE_OTHER): Payer: Self-pay

## 2012-12-09 ENCOUNTER — Encounter: Payer: Self-pay | Admitting: Cardiovascular Disease

## 2012-12-09 ENCOUNTER — Ambulatory Visit (INDEPENDENT_AMBULATORY_CARE_PROVIDER_SITE_OTHER): Payer: BC Managed Care – PPO | Admitting: Cardiovascular Disease

## 2012-12-09 VITALS — BP 120/82 | HR 62 | Resp 12 | Ht 70.0 in | Wt 219.0 lb

## 2012-12-09 DIAGNOSIS — I447 Left bundle-branch block, unspecified: Secondary | ICD-10-CM

## 2012-12-09 NOTE — Assessment & Plan Note (Signed)
Kendra [name] is doing very well. We'll continue with her same medications. I'll see her again in one year. We'll get an EKG at that time as well some fasting lab work.

## 2012-12-09 NOTE — Progress Notes (Signed)
Kendra [name] Date of Birth  05/29/1970 [facility]     [place] Office  [address]    [address]   [address] [place], [place]  [address]    [place], [place]  [address] [phone]  Fax  [phone]  [phone]  Fax [phone]  Problem List: 1. Left bundle branch block 2. Chest pain 3. Kidney stone  History of Present Illness:  Kendra [name] is a 42 year old female. She presented to her medical Dr. with some upper left-sided abdominal pain/breast pain.  This pain is occasionally pleuritic. She was thought to have a pulmonary illness. A d-dimer was subsequent on and found to be negative the An EKG showed left bundle branch block.   A subsequent echocardiogram revealed dyssynchrony of her septum.  She does not exercise on regular basis. She walks occasionally. She's been in the process of moving. She has noticed that she's a little bit more short of breath moving boxes. She's also noticed some left arm tingling and numbness which radiates down to her fingers.  We performed a Lexiscan Myoview which revealed an EF of 43%.  Overall Impression: Low risk stress nuclear study. No ischemia or infarct. Baseline ECG LBBB with decreased EF  LV Ejection Fraction: 43%. LV Wall Motion: Apical hypokinesis  We initially had her on Toprol-XL but she did not tolerate it. She felt very lethargic. We now have her on carvedilol which he seems to tolerate much better.  She's exercising on occasion.  She has been measuring her BP on occasion.  Her readings have been ok but several have been elevated.  Feb. 27, 2014:  She has had several kidney surgeries since I last saw her.  She had some chest pressure when she woke up for anesthesia. She has had some bad indigestion since then.  Better with antiacids.  She has not been exercising much for the past month.  Her BP has been well controlled.    Nov. 10, 2014:  She is doing well. Having some PVCs on occasion.  She has been walking  some.  She has had occaisonal ankle swelling.    No edema today.  She has a new job with  Current Outpatient Prescriptions on File Prior to Visit  Medication Sig Dispense Refill  . carvedilol (COREG) 6.25 MG tablet Take 1 tablet (6.25 mg total) by mouth 2 (two) times daily.  60 tablet  6  . Multiple Vitamin (MULTIVITAMIN) tablet Take 1 tablet by mouth daily.         No current facility-administered medications on file prior to visit.    Allergies  Allergen Reactions  . Codeine Nausea And Vomiting  . Penicillins     REACTION: hives    Past Medical History  Diagnosis Date  . Depression   . Hyperlipidemia   . Chronic headache   . Kidney stones   . Former smoker     quit in 72  . Left bundle branch block   . Systolic dysfunction, left ventricle     EF is 43% per Myoview; dyssynchrony of the septum noted on echo; has LBBB  . Obesity     Past Surgical History  Procedure Laterality Date  . Wrist surgery  11/2001  . Foot surgery  12/2005  . Cholecystectomy  2009  . Tonsillectomy  03/2010  . Cystoscopy with retrograde pyelogram, ureteroscopy and stent placement  03/06/2012    Procedure: CYSTOSCOPY WITH RETROGRADE PYELOGRAM, URETEROSCOPY AND STENT PLACEMENT;  Surgeon: Bettye Boeck  Margarita Grizzle, MD;  Location: [facility];  Service: Urology;  Laterality: Left;  . Cystoscopy with retrograde pyelogram, ureteroscopy and stent placement Left 03/20/2012    Procedure: CYSTOSCOPY WITH RETROGRADE PYELOGRAM, URETEROSCOPY AND STENT PLACEMENT;  Surgeon: Milford Cage, MD;  Location: [facility];  Service: Urology;  Laterality: Left;  balloon dilitation  . Holmium laser application Left 03/20/2012    Procedure: HOLMIUM LASER APPLICATION;  Surgeon: Milford Cage, MD;  Location: [facility];  Service: Urology;  Laterality: Left;    History  Smoking status  . Former Smoker  . Quit date: 01/31/1991  Smokeless tobacco  . Never Used     History  Alcohol Use  . Yes    Family History  Problem Relation Age of Onset  . Hyperlipidemia Mother   . Hyperlipidemia Brother   . Hyperlipidemia Brother   . Colon cancer Neg Hx     Reviw of Systems:  Reviewed in the HPI.  All other systems are negative.  Physical Exam: Blood pressure 120/82, pulse 62, resp. rate 12, height 5\' 10"  (1.778 m), weight 219 lb (99.338 kg). General: Well developed, well nourished, in no acute distress.  Head: Normocephalic, atraumatic, sclera non-icteric, mucus membranes are moist,   Neck: Supple. Carotids are 2 + without bruits. No JVD  Lungs: Clear bilaterally to auscultation.  Heart: regular rate.  normal  S1 S2. No murmurs, gallops or rubs.  Abdomen: Soft, non-tender, non-distended with normal bowel sounds. No hepatomegaly. No rebound/guarding. No masses.  Msk:  Strength and tone are normal  Extremities: No clubbing or cyanosis. No edema.  Distal pedal pulses are 2+ and equal bilaterally.  Neuro: Alert and oriented X 3. Moves all extremities spontaneously.  Psych:  Responds to questions appropriately with a normal affect.  ECG: Nov. 10, 2014:  NSR, at 62, LBBB  (chronic)  Assessment / Plan:

## 2012-12-09 NOTE — Patient Instructions (Signed)
Your physician wants you to follow-up in: 1 year  You will receive a reminder letter in the mail two months in advance. If you don't receive a letter, please call our office to schedule the follow-up appointment.  Your physician recommends that you return for a FASTING lipid profile: 1 year   Your physician recommends that you continue on your current medications as directed. Please refer to the Current Medication list given to you today.    

## 2013-02-07 ENCOUNTER — Ambulatory Visit: Payer: BC Managed Care – PPO | Admitting: Family Medicine

## 2013-04-07 ENCOUNTER — Telehealth: Payer: Self-pay | Admitting: Cardiovascular Disease

## 2013-04-07 DIAGNOSIS — R002 Palpitations: Secondary | ICD-10-CM

## 2013-04-07 NOTE — Telephone Encounter (Signed)
Pt states she is having daily fluttering feelings in her chest and lower throat. Flutter feeling Last 4-5 seconds and usually happens daily up to 4-5 x a day. These feelings have increased.  Order was placed for a 48 holter monitor.

## 2013-04-07 NOTE — Telephone Encounter (Signed)
New problem   Pt is has been having fluttering which she normally don't, she is having it daily. Please call pt concerning this matter.

## 2013-04-07 NOTE — Telephone Encounter (Signed)
Agree with 48 hr holter

## 2013-04-14 ENCOUNTER — Encounter (INDEPENDENT_AMBULATORY_CARE_PROVIDER_SITE_OTHER): Payer: BC Managed Care – PPO

## 2013-04-14 ENCOUNTER — Encounter: Payer: Self-pay | Admitting: *Deleted

## 2013-04-14 DIAGNOSIS — R002 Palpitations: Secondary | ICD-10-CM

## 2013-04-14 NOTE — Progress Notes (Signed)
Patient ID: Kendra [name], female   DOB: 08/12/1970, 43 y.o.   MRN: [id number] E-[employer] 48 hour holter monitor applied to patient.

## 2013-04-16 ENCOUNTER — Telehealth: Payer: Self-pay | Admitting: Cardiovascular Disease

## 2013-04-16 ENCOUNTER — Other Ambulatory Visit: Payer: Self-pay

## 2013-04-16 DIAGNOSIS — I454 Nonspecific intraventricular block: Secondary | ICD-10-CM

## 2013-04-16 MED ORDER — CARVEDILOL 6.25 MG PO TABS: 6.2500 mg | ORAL_TABLET | Freq: Two times a day (BID) | ORAL | Status: DC

## 2013-04-16 NOTE — Telephone Encounter (Signed)
Walk in Pt Form " Pt Dropped off note" gave to [facility]

## 2013-04-18 NOTE — Telephone Encounter (Signed)
New message    Result of Holter monitor.

## 2013-04-18 NOTE — Telephone Encounter (Signed)
Pt was informed that the monitor was not back yet/ should be available Monday, pt verbalized understanding.

## 2013-04-21 NOTE — Telephone Encounter (Signed)
Monitor showed NSR, LBBB with rates from 150-35 bpm. Pt was called at work and home and left a msg to call back and we will get her an app to discuss further.

## 2013-04-22 ENCOUNTER — Encounter: Payer: Self-pay | Admitting: Cardiovascular Disease

## 2013-04-22 ENCOUNTER — Ambulatory Visit (INDEPENDENT_AMBULATORY_CARE_PROVIDER_SITE_OTHER): Payer: BC Managed Care – PPO | Admitting: Cardiovascular Disease

## 2013-04-22 VITALS — BP 138/88 | HR 76 | Ht 70.0 in | Wt 218.0 lb

## 2013-04-22 DIAGNOSIS — R002 Palpitations: Secondary | ICD-10-CM | POA: Insufficient documentation

## 2013-04-22 DIAGNOSIS — I454 Nonspecific intraventricular block: Secondary | ICD-10-CM

## 2013-04-22 MED ORDER — CARVEDILOL 12.5 MG PO TABS: 12.5000 mg | ORAL_TABLET | Freq: Two times a day (BID) | ORAL | Status: DC

## 2013-04-22 MED ORDER — PROPRANOLOL HCL 10 MG PO TABS: 10.0000 mg | ORAL_TABLET | Freq: Four times a day (QID) | ORAL | Status: DC | PRN

## 2013-04-22 NOTE — Telephone Encounter (Signed)
APP MADE FOR TODAY

## 2013-04-22 NOTE — Patient Instructions (Signed)
   Your physician has recommended you make the following change in your medication:  INCREASE COREG/ CARVEDILOL TO 12.5 MG TWICE DAILY  USE PROPRANOLOL AS NEEDED FOR PALPITATIONS Start propranolol 10 mg tablet as needed for palpitations. Take one every thirty minutes up to four doses.  Your physician recommends that you schedule a follow-up appointment in: 3 MONTHS

## 2013-04-22 NOTE — Assessment & Plan Note (Signed)
Kendra [name] is seen today for further evaluation of her palpitations. She wore a Holter monitor which revealed several episodes of sinus tachycardia at 150-160 beats a minute. I suspect that these are due to exertion but she states that she has these palpitations when she is sitting quietly watching TV.  We'll increase her carvedilol to 12.5 mg twice a day. We'll give her prescription for propranolol which she can take on an as-needed basis. She will measure her heart rate during these episodes of palpitations and will report back but she's able to time. I'll see her in 3 months.

## 2013-04-22 NOTE — Progress Notes (Signed)
Kendra [name] Date of Birth  08/17/1970 [address]    [place]   [place], [place]  [address]    [place], [place]  [address] [phone]  Fax  [phone]  [phone]  Fax [phone]  Problem List: 1. Left bundle branch block 2. Chest pain 3. Kidney stone  History of Present Illness:  Kendra [name] is a 43 year old female. She presented to her medical Dr. with some upper left-sided abdominal pain/breast pain.  This pain is occasionally pleuritic. She was thought to have a pulmonary illness. A d-dimer was subsequent on and found to be negative the An EKG showed left bundle branch block.   A subsequent echocardiogram revealed dyssynchrony of her septum.  She does not exercise on regular basis. She walks occasionally. She's been in the process of moving. She has noticed that she's a little bit more short of breath moving boxes. She's also noticed some left arm tingling and numbness which radiates down to her fingers.  We performed a Lexiscan Myoview which revealed an EF of 43%.  Overall Impression: Low risk stress nuclear study. No ischemia or infarct. Baseline ECG LBBB with decreased EF  LV Ejection Fraction: 43%. LV Wall Motion: Apical hypokinesis  We initially had her on Toprol-XL but she did not tolerate it. She felt very lethargic. We now have her on carvedilol which he seems to tolerate much better.  She's exercising on occasion.  She has been measuring her BP on occasion.  Her readings have been ok but several have been elevated.  Feb. 27, 2014:  She has had several kidney surgeries since I last saw her.  She had some chest pressure when she woke up for anesthesia. She has had some bad indigestion since then.  Better with antiacids.  She has not been exercising much for the past month.  Her BP has been well controlled.    Nov. 10, 2014:  She is doing well. Having some PVCs on occasion.  She has been walking  some.  She has had occaisonal ankle swelling.    No edema today.  She has a new job.   April 22, 2013:    Kendra [name]  presents today for further evaluation of palpitations. She's been under a little bit more stress than usual. She's also been fatigued.   We placed a Holter monitor. She had sinus tachycardia as well as sinus bradycardia. She has an underlying bundle branch block. There were no arrhythmias to suggest SVT    Current Outpatient Prescriptions on File Prior to Visit  Medication Sig Dispense Refill  . carvedilol (COREG) 6.25 MG tablet Take 1 tablet (6.25 mg total) by mouth 2 (two) times daily.  60 tablet  6  . Multiple Vitamin (MULTIVITAMIN) tablet Take 1 tablet by mouth daily.         No current facility-administered medications on file prior to visit.    Allergies  Allergen Reactions  . Codeine Nausea And Vomiting  . Penicillins     REACTION: hives    Past Medical History  Diagnosis Date  . Depression   . Hyperlipidemia   . Chronic headache   . Kidney stones   . Former smoker     quit in 75  . Left bundle branch block   . Systolic dysfunction, left ventricle     EF is 43% per Myoview; dyssynchrony of the septum noted on echo; has LBBB  .  Obesity     Past Surgical History  Procedure Laterality Date  . Wrist surgery  11/2001  . Foot surgery  12/2005  . Cholecystectomy  2009  . Tonsillectomy  03/2010  . Cystoscopy with retrograde pyelogram, ureteroscopy and stent placement  03/06/2012    Procedure: CYSTOSCOPY WITH RETROGRADE PYELOGRAM, URETEROSCOPY AND STENT PLACEMENT;  Surgeon: Molli Hazard, MD;  Location: [facility];  Service: Urology;  Laterality: Left;  . Cystoscopy with retrograde pyelogram, ureteroscopy and stent placement Left 03/20/2012    Procedure: CYSTOSCOPY WITH RETROGRADE PYELOGRAM, URETEROSCOPY AND STENT PLACEMENT;  Surgeon: Molli Hazard, MD;  Location: [facility];  Service: Urology;  Laterality: Left;   balloon dilitation  . Holmium laser application Left 0/09/3816    Procedure: HOLMIUM LASER APPLICATION;  Surgeon: Molli Hazard, MD;  Location: [facility];  Service: Urology;  Laterality: Left;    History  Smoking status  . Former Smoker  . Quit date: 01/31/1991  Smokeless tobacco  . Never Used    History  Alcohol Use  . Yes    Family History  Problem Relation Age of Onset  . Hyperlipidemia Mother   . Hyperlipidemia Brother   . Hyperlipidemia Brother   . Colon cancer Neg Hx     Reviw of Systems:  Reviewed in the HPI.  All other systems are negative.  Physical Exam: Blood pressure 138/88, pulse 76, height 5\' 10"  (1.778 m), weight 218 lb (98.884 kg). General: Well developed, well nourished, in no acute distress.  Head: Normocephalic, atraumatic, sclera non-icteric, mucus membranes are moist,   Neck: Supple. Carotids are 2 + without bruits. No JVD  Lungs: Clear bilaterally to auscultation.  Heart: regular rate.  normal  S1 S2. No murmurs, gallops or rubs.  Abdomen: Soft, non-tender, non-distended with normal bowel sounds. No hepatomegaly. No rebound/guarding. No masses.  Msk:  Strength and tone are normal  Extremities: No clubbing or cyanosis. No edema.  Distal pedal pulses are 2+ and equal bilaterally.  Neuro: Alert and oriented X 3. Moves all extremities spontaneously.  Psych:  Responds to questions appropriately with a normal affect.  ECG: Nov. 10, 2014:  NSR, at 63, LBBB  (chronic)  Assessment / Plan:

## 2013-04-22 NOTE — Telephone Encounter (Signed)
Patient is returning your call from yesterday. Please call back. °

## 2013-05-02 ENCOUNTER — Ambulatory Visit: Payer: BC Managed Care – PPO | Admitting: Cardiovascular Disease

## 2013-06-04 ENCOUNTER — Telehealth: Payer: Self-pay | Admitting: Cardiovascular Disease

## 2013-06-04 DIAGNOSIS — Z0189 Encounter for other specified special examinations: Secondary | ICD-10-CM

## 2013-06-04 NOTE — Telephone Encounter (Signed)
Spoke with patient and advised her that Dr. Acie Fredrickson recommends a sleep study.  Patient is agreeable to plan and I answered all of her questions to her satisfaction; order is in epic.  Patient has appointment already scheduled for June 8.

## 2013-06-04 NOTE — Telephone Encounter (Signed)
New message     Pt has had palpitations since sat-----she says she feels "wierd".

## 2013-06-04 NOTE — Telephone Encounter (Signed)
She certainly could have sleep apnea. We can get a split night sleep study

## 2013-06-04 NOTE — Telephone Encounter (Signed)
Spoke with patient who states she began having palpitations on Saturday that woke her up that morning.  Patient states she could feel her pulse pounding but does not know the rate - states it was fast.  Patient states since that time she has been feeling "strange; not like herself."  Patient took Propanolol as directed for these symptoms and has felt occasional palpitations since that time.  Patient states she has not taken more than 2 Propanolol in a day.  Patient denies any recent changes in activity or diet.  States she stopped and checked her BP on the way to work this morning and reports it was 145/99, HR 69.  Patient states she feels light-headed today and states she felt like she was going to lose her balance while pumping gas.  I questioned patient regarding her sleep habits and whether or not she is sleeping well and if she is aware of snoring.  Patient states she has been told she snores.  Patient c/o fatigue - states she thought it might be her body adjusting to the increased dose of Carvedilol.  Patient states she was told to call if she had episodes of tachycardia and/or palpitations.  I advised patient that I will send message to Dr. Acie Fredrickson, who is working in the hospital today, and will contact her with his advice.  Patient verbalized understanding and agreement.

## 2013-07-07 ENCOUNTER — Ambulatory Visit (INDEPENDENT_AMBULATORY_CARE_PROVIDER_SITE_OTHER): Payer: BC Managed Care – PPO | Admitting: Cardiovascular Disease

## 2013-07-07 ENCOUNTER — Encounter: Payer: Self-pay | Admitting: Cardiovascular Disease

## 2013-07-07 VITALS — BP 124/70 | HR 68 | Ht 70.0 in | Wt 221.0 lb

## 2013-07-07 DIAGNOSIS — R002 Palpitations: Secondary | ICD-10-CM

## 2013-07-07 NOTE — Assessment & Plan Note (Signed)
Kendra [name] is doing well.  Sleeping better.  Does not want to have the sleep study now. I encouraged her to work on a diet, exercise program and told her that she may not need to sleep study if she looses weight.   She is stable .  I will see her in 1 year.

## 2013-07-07 NOTE — Patient Instructions (Signed)
Your physician wants you to follow-up in: 1 year with DR. Nahser You will receive a reminder letter in the mail two months in advance. If you don't receive a letter, please call our office to schedule the follow-up appointment.  Your physician recommends that you continue on your current medications as directed. Please refer to the Current Medication list given to you today.

## 2013-07-07 NOTE — Progress Notes (Signed)
Kendra [name] Date of Birth  [birth year] [address]    [place]   [place], [place]  [address]    [place], [place]  [address] [phone]  Fax  [phone]  [phone]  Fax [phone]  Problem List: 1. Left bundle branch block 2. Chest pain 3. Kidney stone  History of Present Illness:  Kendra [name]  is a 43 y.o. female. She presented to her medical Dr. with some upper left-sided abdominal pain/breast pain.  This pain is occasionally pleuritic. She was thought to have a pulmonary illness. A d-dimer was subsequent on and found to be negative the An EKG showed left bundle branch block.   A subsequent echocardiogram revealed dyssynchrony of her septum.  She does not exercise on regular basis. She walks occasionally. She's been in the process of moving. She has noticed that she's a little bit more short of breath moving boxes. She's also noticed some left arm tingling and numbness which radiates down to her fingers.  We performed a Lexiscan Myoview which revealed an EF of 43%.  Overall Impression: Low risk stress nuclear study. No ischemia or infarct. Baseline ECG LBBB with decreased EF  LV Ejection Fraction: 43%. LV Wall Motion: Apical hypokinesis  We initially had her on Toprol-XL but she did not tolerate it. She felt very lethargic. We now have her on carvedilol which he seems to tolerate much better.  She's exercising on occasion.  She has been measuring her BP on occasion.  Her readings have been ok but several have been elevated.  Feb. 27, 2014:  She has had several kidney surgeries since I last saw her.  She had some chest pressure when she woke up for anesthesia. She has had some bad indigestion since then.  Better with antiacids.  She has not been exercising much for the past month.  Her BP has been well controlled.    Nov. 10, 2014:  She is doing well. Having some PVCs on occasion.  She has been walking  some.  She has had occaisonal ankle swelling.    No edema today.  She has a new job.   April 22, 2013:    Kendra [name]  presents today for further evaluation of palpitations. She's been under a little bit more stress than usual. She's also been fatigued.   We placed a Holter monitor. She had sinus tachycardia as well as sinus bradycardia. She has an underlying bundle branch block. There were no arrhythmias to suggest SVT   July 07, 2013:  Kendra [name] is doing well.  Able to do all of her normal activities without problems. Trying to exercise.  She has not had many palpitations.  Sleeping better.    Wants to delay getting the sleep study.   Current Outpatient Prescriptions on File Prior to Visit  Medication Sig Dispense Refill  . carvedilol (COREG) 12.5 MG tablet Take 1 tablet (12.5 mg total) by mouth 2 (two) times daily.  180 tablet  3  . Multiple Vitamin (MULTIVITAMIN) tablet Take 1 tablet by mouth daily.        . propranolol (INDERAL) 10 MG tablet Take 1 tablet (10 mg total) by mouth 4 (four) times daily as needed (AS NEEDED FOR PALPITATIONS.).  45 tablet  3   No current facility-administered medications on file prior to visit.    Allergies  Allergen Reactions  . Codeine Nausea And Vomiting  . Penicillins  REACTION: hives    Past Medical History  Diagnosis Date  . Depression   . Hyperlipidemia   . Chronic headache   . Kidney stones   . Former smoker     quit in 32  . Left bundle branch block   . Systolic dysfunction, left ventricle     EF is 43% per Myoview; dyssynchrony of the septum noted on echo; has LBBB  . Obesity     Past Surgical History  Procedure Laterality Date  . Wrist surgery  11/2001  . Foot surgery  12/2005  . Cholecystectomy  2009  . Tonsillectomy  03/2010  . Cystoscopy with retrograde pyelogram, ureteroscopy and stent placement  03/06/2012    Procedure: CYSTOSCOPY WITH RETROGRADE PYELOGRAM, URETEROSCOPY AND STENT PLACEMENT;  Surgeon: Molli Hazard, MD;   Location: [facility];  Service: Urology;  Laterality: Left;  . Cystoscopy with retrograde pyelogram, ureteroscopy and stent placement Left 03/20/2012    Procedure: CYSTOSCOPY WITH RETROGRADE PYELOGRAM, URETEROSCOPY AND STENT PLACEMENT;  Surgeon: Molli Hazard, MD;  Location: [facility];  Service: Urology;  Laterality: Left;  balloon dilitation  . Holmium laser application Left 10/24/4626    Procedure: HOLMIUM LASER APPLICATION;  Surgeon: Molli Hazard, MD;  Location: [facility];  Service: Urology;  Laterality: Left;    History  Smoking status  . Former Smoker  . Quit date: 01/31/1991  Smokeless tobacco  . Never Used    History  Alcohol Use  . Yes    Family History  Problem Relation Age of Onset  . Hyperlipidemia Mother   . Hyperlipidemia Brother   . Hyperlipidemia Brother   . Colon cancer Neg Hx     Reviw of Systems:  Reviewed in the HPI.  All other systems are negative.  Physical Exam: Blood pressure 124/70, pulse 68, height 5\' 10"  (1.778 m), weight 221 lb (100.245 kg). General: Well developed, well nourished, in no acute distress.  Head: Normocephalic, atraumatic, sclera non-icteric, mucus membranes are moist,   Neck: Supple. Carotids are 2 + without bruits. No JVD  Lungs: Clear bilaterally to auscultation.  Heart: regular rate.  normal  S1 S2. No murmurs, gallops or rubs.  Abdomen: Soft, non-tender, non-distended with normal bowel sounds. No hepatomegaly. No rebound/guarding. No masses.  Msk:  Strength and tone are normal  Extremities: No clubbing or cyanosis. No edema.  Distal pedal pulses are 2+ and equal bilaterally.  Neuro: Alert and oriented X 3. Moves all extremities spontaneously.  Psych:  Responds to questions appropriately with a normal affect.  ECG: Nov. 10, 2014:  NSR, at 63, LBBB  (chronic)  Assessment / Plan:

## 2013-07-17 ENCOUNTER — Encounter (HOSPITAL_BASED_OUTPATIENT_CLINIC_OR_DEPARTMENT_OTHER): Payer: BC Managed Care – PPO

## 2013-08-06 ENCOUNTER — Telehealth: Payer: Self-pay | Admitting: Family Medicine

## 2013-08-06 DIAGNOSIS — Z0000 Encounter for general adult medical examination without abnormal findings: Secondary | ICD-10-CM | POA: Insufficient documentation

## 2013-08-06 NOTE — Telephone Encounter (Signed)
Message copied by Abner Greenspan on Wed Aug 06, 2013 10:09 PM ------      Message from: Ellamae Sia      Created: Wed Aug 06, 2013  2:37 PM      Regarding: Lab orders for Thursday, 7.9.15       Patient is scheduled for CPX labs, please order future labs, Thanks , Terri       ------

## 2013-08-07 ENCOUNTER — Other Ambulatory Visit (INDEPENDENT_AMBULATORY_CARE_PROVIDER_SITE_OTHER): Payer: BC Managed Care – PPO

## 2013-08-07 DIAGNOSIS — Z0000 Encounter for general adult medical examination without abnormal findings: Secondary | ICD-10-CM

## 2013-08-07 LAB — CBC WITH DIFFERENTIAL/PLATELET
BASOS ABS: 0 10*3/uL (ref 0.0–0.1)
Basophils Relative: 0.6 % (ref 0.0–3.0)
EOS PCT: 1.3 % (ref 0.0–5.0)
Eosinophils Absolute: 0.1 10*3/uL (ref 0.0–0.7)
HCT: 41.8 % (ref 36.0–46.0)
Hemoglobin: 13.9 g/dL (ref 12.0–15.0)
Lymphocytes Relative: 22.2 % (ref 12.0–46.0)
Lymphs Abs: 1.7 10*3/uL (ref 0.7–4.0)
MCHC: 33.2 g/dL (ref 30.0–36.0)
MCV: 90.9 fl (ref 78.0–100.0)
MONOS PCT: 5.3 % (ref 3.0–12.0)
Monocytes Absolute: 0.4 10*3/uL (ref 0.1–1.0)
NEUTROS PCT: 70.6 % (ref 43.0–77.0)
Neutro Abs: 5.5 10*3/uL (ref 1.4–7.7)
PLATELETS: 278 10*3/uL (ref 150.0–400.0)
RBC: 4.61 Mil/uL (ref 3.87–5.11)
RDW: 13.4 % (ref 11.5–15.5)
WBC: 7.8 10*3/uL (ref 4.0–10.5)

## 2013-08-07 LAB — COMPREHENSIVE METABOLIC PANEL
ALBUMIN: 4 g/dL (ref 3.5–5.2)
ALK PHOS: 60 U/L (ref 39–117)
ALT: 30 U/L (ref 0–35)
AST: 29 U/L (ref 0–37)
BUN: 14 mg/dL (ref 6–23)
CO2: 26 meq/L (ref 19–32)
Calcium: 9.2 mg/dL (ref 8.4–10.5)
Chloride: 105 mEq/L (ref 96–112)
Creatinine, Ser: 0.8 mg/dL (ref 0.4–1.2)
GFR: 84.74 mL/min (ref >60.00)
GLUCOSE: 99 mg/dL (ref 70–99)
POTASSIUM: 4.4 meq/L (ref 3.5–5.1)
SODIUM: 137 meq/L (ref 135–145)
TOTAL PROTEIN: 7.1 g/dL (ref 6.0–8.3)
Total Bilirubin: 0.8 mg/dL (ref 0.2–1.2)

## 2013-08-07 LAB — TSH: TSH: 1.64 u[IU]/mL (ref 0.35–4.50)

## 2013-08-07 LAB — LIPID PANEL
CHOLESTEROL: 229 mg/dL — AB (ref 0–200)
HDL: 62.2 mg/dL (ref >39.00)
LDL CALC: 147 mg/dL — AB (ref 0–99)
NONHDL: 166.8
Total CHOL/HDL Ratio: 4
Triglycerides: 101 mg/dL (ref 0.0–149.0)
VLDL: 20.2 mg/dL (ref 0.0–40.0)

## 2013-08-11 ENCOUNTER — Encounter: Payer: Self-pay | Admitting: Family Medicine

## 2013-08-11 ENCOUNTER — Ambulatory Visit (INDEPENDENT_AMBULATORY_CARE_PROVIDER_SITE_OTHER): Payer: BC Managed Care – PPO | Admitting: Family Medicine

## 2013-08-11 VITALS — BP 118/82 | HR 99 | Temp 98.2°F | Ht 69.5 in | Wt 222.5 lb

## 2013-08-11 DIAGNOSIS — Z0000 Encounter for general adult medical examination without abnormal findings: Secondary | ICD-10-CM

## 2013-08-11 DIAGNOSIS — E785 Hyperlipidemia, unspecified: Secondary | ICD-10-CM

## 2013-08-11 NOTE — Assessment & Plan Note (Signed)
Reviewed health habits including diet and exercise and skin cancer prevention Reviewed appropriate screening tests for age  Also reviewed health mt list, fam hx and immunization status , as well as social and family history   See HPI Lab rev Disc low impact exercise

## 2013-08-11 NOTE — Progress Notes (Signed)
Subjective:    Patient ID: Kendra [name], female    DOB: [birth year], 43 y.o.   MRN: [id number]  HPI Here for health maintenance exam and to review chronic medical problems    A few issues : She has started having hot flashes at night - is aware of them , 3-4 times  Thinks she is in perimenopause  Some aches and pains at times  occ hip/knee/back - depends on the time   Had a time with L knee- it woke her up at night -made an appt with ortho- cancelled it and got better   For exercise-not much of anything    Has been doing ok overall    Wt is up 1 lb with bmi of 32 Up 13 lb in the past year   Gyn care - goes to her gyn annually  Has IUD -mirena , and likes it for the most part  Still gets a period with it - is sporatic  Without IUD her menses lasted 10-11 days   Flu vaccine -did not get - she does not want them   Mammogram 10/14 nl  Self exam -no lumps   Td 8/12   Results for orders placed in visit on 08/07/13  CBC WITH DIFFERENTIAL      Result Value Ref Range   WBC 7.8  4.0 - 10.5 K/uL   RBC 4.61  3.87 - 5.11 Mil/uL   Hemoglobin 13.9  12.0 - 15.0 g/dL   HCT 41.8  36.0 - 46.0 %   MCV 90.9  78.0 - 100.0 fl   MCHC 33.2  30.0 - 36.0 g/dL   RDW 13.4  11.5 - 15.5 %   Platelets 278.0  150.0 - 400.0 K/uL   Neutrophils Relative % 70.6  43.0 - 77.0 %   Lymphocytes Relative 22.2  12.0 - 46.0 %   Monocytes Relative 5.3  3.0 - 12.0 %   Eosinophils Relative 1.3  0.0 - 5.0 %   Basophils Relative 0.6  0.0 - 3.0 %   Neutro Abs 5.5  1.4 - 7.7 K/uL   Lymphs Abs 1.7  0.7 - 4.0 K/uL   Monocytes Absolute 0.4  0.1 - 1.0 K/uL   Eosinophils Absolute 0.1  0.0 - 0.7 K/uL   Basophils Absolute 0.0  0.0 - 0.1 K/uL  COMPREHENSIVE METABOLIC PANEL      Result Value Ref Range   Sodium 137  135 - 145 mEq/L   Potassium 4.4  3.5 - 5.1 mEq/L   Chloride 105  96 - 112 mEq/L   CO2 26  19 - 32 mEq/L   Glucose, Bld 99  70 - 99 mg/dL   BUN 14  6 - 23 mg/dL   Creatinine, Ser 0.8  0.4 - 1.2  mg/dL   Total Bilirubin 0.8  0.2 - 1.2 mg/dL   Alkaline Phosphatase 60  39 - 117 U/L   AST 29  0 - 37 U/L   ALT 30  0 - 35 U/L   Total Protein 7.1  6.0 - 8.3 g/dL   Albumin 4.0  3.5 - 5.2 g/dL   Calcium 9.2  8.4 - 10.5 mg/dL   GFR 84.74  >60.00 mL/min  LIPID PANEL      Result Value Ref Range   Cholesterol 229 (*) 0 - 200 mg/dL   Triglycerides 101.0  0.0 - 149.0 mg/dL   HDL 62.20  >39.00 mg/dL   VLDL 20.2  0.0 - 40.0 mg/dL  LDL Cholesterol 147 (*) 0 - 99 mg/dL   Total CHOL/HDL Ratio 4     NonHDL 166.80    TSH      Result Value Ref Range   TSH 1.64  0.35 - 4.50 uIU/mL    Cholesterol - LDL is mildly high - good HDL  She does eat some high chol foods-not that often Really loves cheese    Patient Active Problem List   Diagnosis Date Noted  . Routine general medical examination at a health care facility 08/06/2013  . Palpitations 04/22/2013  . Chronic systolic congestive heart failure 08/16/2011  . Left bundle branch block 05/08/2011  . Pleuritic chest pain 04/07/2011  . UTI (lower urinary tract infection) 03/15/2011  . Ureteral stone 03/15/2011  . Screening for HIV (human immunodeficiency virus) 09/16/2010  . WEIGHT LOSS-ABNORMAL 01/11/2009  . DEPRESSION 10/20/2008  . HYPERLIPIDEMIA 06/22/2007  . HEADACHE, CHRONIC 06/22/2007  . NEPHROLITHIASIS, HX OF 06/22/2007  . ECZEMA 03/25/2007   Past Medical History  Diagnosis Date  . Depression   . Hyperlipidemia   . Chronic headache   . Kidney stones   . Former smoker     quit in 17  . Left bundle branch block   . Systolic dysfunction, left ventricle     EF is 43% per Myoview; dyssynchrony of the septum noted on echo; has LBBB  . Obesity    Past Surgical History  Procedure Laterality Date  . Wrist surgery  11/2001  . Foot surgery  12/2005  . Cholecystectomy  2009  . Tonsillectomy  03/2010  . Cystoscopy with retrograde pyelogram, ureteroscopy and stent placement  03/06/2012    Procedure: CYSTOSCOPY WITH RETROGRADE  PYELOGRAM, URETEROSCOPY AND STENT PLACEMENT;  Surgeon: Molli Hazard, MD;  Location: [facility];  Service: Urology;  Laterality: Left;  . Cystoscopy with retrograde pyelogram, ureteroscopy and stent placement Left 03/20/2012    Procedure: CYSTOSCOPY WITH RETROGRADE PYELOGRAM, URETEROSCOPY AND STENT PLACEMENT;  Surgeon: Molli Hazard, MD;  Location: [facility];  Service: Urology;  Laterality: Left;  balloon dilitation  . Holmium laser application Left 07/13/4313    Procedure: HOLMIUM LASER APPLICATION;  Surgeon: Molli Hazard, MD;  Location: [facility];  Service: Urology;  Laterality: Left;   History  Substance Use Topics  . Smoking status: Former Smoker    Quit date: 01/31/1991  . Smokeless tobacco: Never Used  . Alcohol Use: Yes     Comment: occ   Family History  Problem Relation Age of Onset  . Hyperlipidemia Mother   . Hyperlipidemia Brother   . Hyperlipidemia Brother   . Colon cancer Neg Hx    Allergies  Allergen Reactions  . Codeine Nausea And Vomiting  . Penicillins     REACTION: hives   Current Outpatient Prescriptions on File Prior to Visit  Medication Sig Dispense Refill  . carvedilol (COREG) 12.5 MG tablet Take 1 tablet (12.5 mg total) by mouth 2 (two) times daily.  180 tablet  3  . Multiple Vitamin (MULTIVITAMIN) tablet Take 1 tablet by mouth daily.         No current facility-administered medications on file prior to visit.    Review of Systems Review of Systems  Constitutional: Negative for fever, appetite change,  and unexpected weight change.  Eyes: Negative for pain and visual disturbance.  Respiratory: Negative for cough and shortness of breath.   Cardiovascular: Negative for cp or palpitations    Gastrointestinal: Negative for nausea, diarrhea  and constipation.  Genitourinary: Negative for urgency and frequency. pos for hot flashes  Skin: Negative for pallor or rash   MSK pos for aches  and pains  Neurological: Negative for weakness, light-headedness, numbness and headaches.  Hematological: Negative for adenopathy. Does not bruise/bleed easily.  Psychiatric/Behavioral: Negative for dysphoric mood. The patient is not nervous/anxious.         Objective:   Physical Exam  Constitutional: She appears well-developed and well-nourished. No distress.  obese and well appearing   HENT:  Head: Normocephalic and atraumatic.  Right Ear: External ear normal.  Left Ear: External ear normal.  Nose: Nose normal.  Mouth/Throat: Oropharynx is clear and moist.  Eyes: Conjunctivae and EOM are normal. Pupils are equal, round, and reactive to light. Right eye exhibits no discharge. Left eye exhibits no discharge. No scleral icterus.  Neck: Normal range of motion. Neck supple. No JVD present. Carotid bruit is not present. No thyromegaly present.  Cardiovascular: Normal rate, regular rhythm, normal heart sounds and intact distal pulses.  Exam reveals no gallop.   Pulmonary/Chest: Effort normal and breath sounds normal. No respiratory distress. She has no wheezes. She has no rales.  Abdominal: Soft. Bowel sounds are normal. She exhibits no distension and no mass. There is no tenderness.  Musculoskeletal: She exhibits no edema and no tenderness.  No acute joint changes   Lymphadenopathy:    She has no cervical adenopathy.  Neurological: She is alert. She has normal reflexes. No cranial nerve deficit. She exhibits normal muscle tone. Coordination normal.  Skin: Skin is warm and dry. No rash noted. No erythema. No pallor.  Psychiatric: She has a normal mood and affect.          Assessment & Plan:   Problem List Items Addressed This Visit     Other   HYPERLIPIDEMIA     Disc goals for lipids and reasons to control them Rev labs with pt Rev low sat fat diet in detail High chol runs in family Goal for LDL is under 130- will work on diet/handout given  HDL is good    Routine general  medical examination at a health care facility - Primary     Reviewed health habits including diet and exercise and skin cancer prevention Reviewed appropriate screening tests for age  Also reviewed health mt list, fam hx and immunization status , as well as social and family history   See HPI Lab rev Disc low impact exercise

## 2013-08-11 NOTE — Patient Instructions (Signed)
I do think you are having some perimenopausal symptoms  Drink lots of water and eat a healthy diet  Avoid red meat/ fried foods/ egg yolks/ fatty breakfast meats/ butter, cheese and high fat dairy/ and shellfish  -for cholesterol  Think about low impact exercise (water exercise/ yoga/walking)   Fat and Cholesterol Control Diet Fat and cholesterol levels in your blood and organs are influenced by your diet. High levels of fat and cholesterol may lead to diseases of the heart, small and large blood vessels, gallbladder, liver, and pancreas. CONTROLLING FAT AND CHOLESTEROL WITH DIET Although exercise and lifestyle factors are important, your diet is key. That is because certain foods are known to raise cholesterol and others to lower it. The goal is to balance foods for their effect on cholesterol and more importantly, to replace saturated and trans fat with other types of fat, such as monounsaturated fat, polyunsaturated fat, and omega-3 fatty acids. On average, a person should consume no more than 15 to 17 g of saturated fat daily. Saturated and trans fats are considered "bad" fats, and they will raise LDL cholesterol. Saturated fats are primarily found in animal products such as meats, butter, and cream. However, that does not mean you need to give up all your favorite foods. Today, there are good tasting, low-fat, low-cholesterol substitutes for most of the things you like to eat. Choose low-fat or nonfat alternatives. Choose round or loin cuts of red meat. These types of cuts are lowest in fat and cholesterol. Chicken (without the skin), fish, veal, and ground [place] breast are great choices. Eliminate fatty meats, such as hot dogs and salami. Even shellfish have little or no saturated fat. Have a 3 oz (85 g) portion when you eat lean meat, poultry, or fish. Trans fats are also called "partially hydrogenated oils." They are oils that have been scientifically manipulated so that they are solid at room  temperature resulting in a longer shelf life and improved taste and texture of foods in which they are added. Trans fats are found in stick margarine, some tub margarines, cookies, crackers, and baked goods.  When baking and cooking, oils are a great substitute for butter. The monounsaturated oils are especially beneficial since it is believed they lower LDL and raise HDL. The oils you should avoid entirely are saturated tropical oils, such as coconut and palm.  Remember to eat a lot from food groups that are naturally free of saturated and trans fat, including fish, fruit, vegetables, beans, grains (barley, rice, couscous, bulgur wheat), and pasta (without cream sauces).  IDENTIFYING FOODS THAT LOWER FAT AND CHOLESTEROL  Soluble fiber may lower your cholesterol. This type of fiber is found in fruits such as apples, vegetables such as broccoli, potatoes, and carrots, legumes such as beans, peas, and lentils, and grains such as barley. Foods fortified with plant sterols (phytosterol) may also lower cholesterol. You should eat at least 2 g per day of these foods for a cholesterol lowering effect.  Read package labels to identify low-saturated fats, trans fat free, and low-fat foods at the supermarket. Select cheeses that have only 2 to 3 g saturated fat per ounce. Use a heart-healthy tub margarine that is free of trans fats or partially hydrogenated oil. When buying baked goods (cookies, crackers), avoid partially hydrogenated oils. Breads and muffins should be made from whole grains (whole-wheat or whole oat flour, instead of "flour" or "enriched flour"). Buy non-creamy canned soups with reduced salt and no added fats.  FOOD PREPARATION  TECHNIQUES  Never deep-fry. If you must fry, either stir-fry, which uses very little fat, or use non-stick cooking sprays. When possible, broil, bake, or roast meats, and steam vegetables. Instead of putting butter or margarine on vegetables, use lemon and herbs, applesauce,  and cinnamon (for squash and sweet potatoes). Use nonfat yogurt, salsa, and low-fat dressings for salads.  LOW-SATURATED FAT / LOW-FAT FOOD SUBSTITUTES Meats / Saturated Fat (g)  Avoid: Steak, marbled (3 oz/85 g) / 11 g  Choose: Steak, lean (3 oz/85 g) / 4 g  Avoid: Hamburger (3 oz/85 g) / 7 g  Choose: Hamburger, lean (3 oz/85 g) / 5 g  Avoid: Ham (3 oz/85 g) / 6 g  Choose: Ham, lean cut (3 oz/85 g) / 2.4 g  Avoid: Chicken, with skin, dark meat (3 oz/85 g) / 4 g  Choose: Chicken, skin removed, dark meat (3 oz/85 g) / 2 g  Avoid: Chicken, with skin, light meat (3 oz/85 g) / 2.5 g  Choose: Chicken, skin removed, light meat (3 oz/85 g) / 1 g Dairy / Saturated Fat (g)  Avoid: Whole milk (1 cup) / 5 g  Choose: Low-fat milk, 2% (1 cup) / 3 g  Choose: Low-fat milk, 1% (1 cup) / 1.5 g  Choose: Skim milk (1 cup) / 0.3 g  Avoid: Hard cheese (1 oz/28 g) / 6 g  Choose: Skim milk cheese (1 oz/28 g) / 2 to 3 g  Avoid: Cottage cheese, 4% fat (1 cup) / 6.5 g  Choose: Low-fat cottage cheese, 1% fat (1 cup) / 1.5 g  Avoid: Ice cream (1 cup) / 9 g  Choose: Sherbet (1 cup) / 2.5 g  Choose: Nonfat frozen yogurt (1 cup) / 0.3 g  Choose: Frozen fruit bar / trace  Avoid: Whipped cream (1 tbs) / 3.5 g  Choose: Nondairy whipped topping (1 tbs) / 1 g Condiments / Saturated Fat (g)  Avoid: Mayonnaise (1 tbs) / 2 g  Choose: Low-fat mayonnaise (1 tbs) / 1 g  Avoid: Butter (1 tbs) / 7 g  Choose: Extra light margarine (1 tbs) / 1 g  Avoid: Coconut oil (1 tbs) / 11.8 g  Choose: Olive oil (1 tbs) / 1.8 g  Choose: Corn oil (1 tbs) / 1.7 g  Choose: Safflower oil (1 tbs) / 1.2 g  Choose: Sunflower oil (1 tbs) / 1.4 g  Choose: Soybean oil (1 tbs) / 2.4 g  Choose: Canola oil (1 tbs) / 1 g Document Released: 01/16/2005 Document Revised: 05/13/2012 Document Reviewed: 07/07/2010 Exit[employer] Patient Information 2015 [place], [place]. This information is not intended to replace advice  given to you by your health care provider. Make sure you discuss any questions you have with your health care provider.

## 2013-08-11 NOTE — Progress Notes (Signed)
Pre visit review using our clinic review tool, if applicable. No additional management support is needed unless otherwise documented below in the visit note. 

## 2013-08-11 NOTE — Assessment & Plan Note (Signed)
Disc goals for lipids and reasons to control them Rev labs with pt Rev low sat fat diet in detail High chol runs in family Goal for LDL is under 130- will work on diet/handout given  HDL is good

## 2013-08-25 ENCOUNTER — Telehealth: Payer: Self-pay

## 2013-08-25 NOTE — Telephone Encounter (Signed)
We really need to talk in person about this in order to pick the right med and disc expectations/ side eff etc Please follow up

## 2013-08-25 NOTE — Telephone Encounter (Signed)
Pt left v/m; pt was seen 08/11/13 for annual exam; pt is going thru a very bad breakup in the past week and pt needs antidepressant to help her thru this difficult time. Pt had tried wellbutrin in the past and did not like the wellbutrin. Pt wanted to know if antidepressant could be sent to [facility] without pt scheduling another appt.. Pt request cb.

## 2013-08-25 NOTE — Telephone Encounter (Signed)
Pt notified she needs an appt., appt scheduled for 08/29/13

## 2013-08-29 ENCOUNTER — Ambulatory Visit: Payer: BC Managed Care – PPO | Admitting: Family Medicine

## 2013-10-09 ENCOUNTER — Telehealth: Payer: Self-pay | Admitting: Gastroenterology

## 2013-10-09 NOTE — Telephone Encounter (Signed)
Left message on machine to call back  

## 2013-10-09 NOTE — Telephone Encounter (Signed)
The pt is complaining of a dull right side abd pain that is getting worse.  No other symptoms.  Appt was made to see Kendra [name] on 10/13/13 230 pm.  She is aware of copay and no show policy

## 2013-10-13 ENCOUNTER — Ambulatory Visit: Payer: BC Managed Care – PPO | Admitting: Nurse Practitioner

## 2013-11-17 ENCOUNTER — Telehealth: Payer: Self-pay | Admitting: Gastroenterology

## 2013-11-17 NOTE — Telephone Encounter (Signed)
Pt is having constant abd pain she was scheduled to see Nevin Bloodgood but felt better and not the pain has returned.  She is aware of the time and date and will keep appt.

## 2013-11-18 ENCOUNTER — Encounter: Payer: Self-pay | Admitting: Physician Assistant

## 2013-11-18 ENCOUNTER — Other Ambulatory Visit (INDEPENDENT_AMBULATORY_CARE_PROVIDER_SITE_OTHER): Payer: BC Managed Care – PPO

## 2013-11-18 ENCOUNTER — Ambulatory Visit (INDEPENDENT_AMBULATORY_CARE_PROVIDER_SITE_OTHER)
Admission: RE | Admit: 2013-11-18 | Discharge: 2013-11-18 | Disposition: A | Discharge status: Home / Self Care | Payer: BC Managed Care – PPO | Source: Ambulatory Visit | Attending: Physician Assistant | Admitting: Physician Assistant

## 2013-11-18 ENCOUNTER — Ambulatory Visit (INDEPENDENT_AMBULATORY_CARE_PROVIDER_SITE_OTHER): Payer: BC Managed Care – PPO | Admitting: Physician Assistant

## 2013-11-18 VITALS — BP 130/74 | HR 82 | Ht 70.0 in | Wt 215.0 lb

## 2013-11-18 DIAGNOSIS — R109 Unspecified abdominal pain: Secondary | ICD-10-CM

## 2013-11-18 DIAGNOSIS — R1031 Right lower quadrant pain: Secondary | ICD-10-CM

## 2013-11-18 DIAGNOSIS — R197 Diarrhea, unspecified: Secondary | ICD-10-CM

## 2013-11-18 DIAGNOSIS — R10A1 Flank pain, right side: Secondary | ICD-10-CM

## 2013-11-18 LAB — URINALYSIS, ROUTINE W REFLEX MICROSCOPIC
HGB URINE DIPSTICK: NEGATIVE
KETONES UR: NEGATIVE
Leukocytes, UA: NEGATIVE
Nitrite: NEGATIVE
RBC / HPF: NONE SEEN (ref 0–?)
Specific Gravity, Urine: >=1.03 — AB (ref 1.000–1.030)
TOTAL PROTEIN, URINE-UPE24: NEGATIVE
URINE GLUCOSE: NEGATIVE
UROBILINOGEN UA: 0.2 (ref 0.0–1.0)
pH: 6 (ref 5.0–8.0)

## 2013-11-18 LAB — CBC WITH DIFFERENTIAL/PLATELET
BASOS ABS: 0 10*3/uL (ref 0.0–0.1)
Basophils Relative: 0.5 % (ref 0.0–3.0)
EOS PCT: 1.2 % (ref 0.0–5.0)
Eosinophils Absolute: 0.1 10*3/uL (ref 0.0–0.7)
HEMATOCRIT: 43.1 % (ref 36.0–46.0)
Hemoglobin: 14.2 g/dL (ref 12.0–15.0)
LYMPHS ABS: 2 10*3/uL (ref 0.7–4.0)
LYMPHS PCT: 23.3 % (ref 12.0–46.0)
MCHC: 33 g/dL (ref 30.0–36.0)
MCV: 91 fl (ref 78.0–100.0)
MONOS PCT: 5.8 % (ref 3.0–12.0)
Monocytes Absolute: 0.5 10*3/uL (ref 0.1–1.0)
Neutro Abs: 5.8 10*3/uL (ref 1.4–7.7)
Neutrophils Relative %: 69.2 % (ref 43.0–77.0)
PLATELETS: 289 10*3/uL (ref 150.0–400.0)
RBC: 4.73 Mil/uL (ref 3.87–5.11)
RDW: 12.8 % (ref 11.5–15.5)
WBC: 8.4 10*3/uL (ref 4.0–10.5)

## 2013-11-18 LAB — COMPREHENSIVE METABOLIC PANEL
ALBUMIN: 3.7 g/dL (ref 3.5–5.2)
ALT: 20 U/L (ref 0–35)
AST: 18 U/L (ref 0–37)
Alkaline Phosphatase: 66 U/L (ref 39–117)
BILIRUBIN TOTAL: 0.8 mg/dL (ref 0.2–1.2)
BUN: 13 mg/dL (ref 6–23)
CALCIUM: 9.4 mg/dL (ref 8.4–10.5)
CHLORIDE: 104 meq/L (ref 96–112)
CO2: 23 meq/L (ref 19–32)
Creatinine, Ser: 0.8 mg/dL (ref 0.4–1.2)
GFR: 79.93 mL/min (ref >60.00)
GLUCOSE: 107 mg/dL — AB (ref 70–99)
POTASSIUM: 4.2 meq/L (ref 3.5–5.1)
SODIUM: 139 meq/L (ref 135–145)
TOTAL PROTEIN: 7.4 g/dL (ref 6.0–8.3)

## 2013-11-18 MED ORDER — COLESTIPOL HCL 1 G PO TABS: 2.0000 g | ORAL_TABLET | Freq: Two times a day (BID) | ORAL | Status: DC

## 2013-11-18 MED ORDER — DICYCLOMINE HCL 10 MG PO CAPS: 10.0000 mg | ORAL_CAPSULE | Freq: Three times a day (TID) | ORAL | Status: DC

## 2013-11-18 NOTE — Progress Notes (Signed)
Patient ID: Kendra [name], female   DOB: 1970/06/01, 43 y.o.   MRN: [id number]     History of Present Illness: This is a followup for this pleasant 43 year old white female who presents with complaints of ongoing loose stools and right-sided abdominal pain. She is status post a cholecystectomy and has had colorrhea diarrhea for the past couple of years. She had been given a trial of cholestyramine., but due to the taste discontinued it. She did feel the cholestyramine helped firm up her stools. She is currently having 4 or 5 watery to mushy bowel movements daily for the past several months.   She is here today because she has been having on going right-sided abdominal pain that has become more pronounced over the past 3 weeks. Her pain is constant and is a 3 on a scale of 1-10 .It is exacerbated by lying down but is not exacerbated by movement.Iit is not exacerbated with ingestion of food but it is somewhat alleviated with defecation or passage of flatus. She has had no bright red blood per rectum and no melena. She has no oily stools. She has minimal nausea. Her pain starts below the right costal margin and radiates in to the RLQ. She also has right flank pain. She has had no hematuria but her urine has been cloudy. She has no dysuria or frequency. No vaginal discharge. LMP 2 weeks ago.   Past Medical History  Diagnosis Date  . Depression   . Hyperlipidemia   . Chronic headache   . Kidney stones   . Former smoker     quit in 36  . Left bundle branch block   . Systolic dysfunction, left ventricle     EF is 43% per Myoview; dyssynchrony of the septum noted on echo; has LBBB  . Obesity     Past Surgical History  Procedure Laterality Date  . Wrist surgery  11/2001  . Foot surgery  12/2005  . Cholecystectomy  2009  . Tonsillectomy  03/2010  . Cystoscopy with retrograde pyelogram, ureteroscopy and stent placement  03/06/2012    Procedure: CYSTOSCOPY WITH RETROGRADE PYELOGRAM, URETEROSCOPY  AND STENT PLACEMENT;  Surgeon: Molli Hazard, MD;  Location: [facility];  Service: Urology;  Laterality: Left;  . Cystoscopy with retrograde pyelogram, ureteroscopy and stent placement Left 03/20/2012    Procedure: CYSTOSCOPY WITH RETROGRADE PYELOGRAM, URETEROSCOPY AND STENT PLACEMENT;  Surgeon: Molli Hazard, MD;  Location: [facility]  Service: Urology;  Laterality: Left;  balloon dilitation  . Holmium laser application Left 8/93/8101    Procedure: HOLMIUM LASER APPLICATION;  Surgeon: Molli Hazard, MD;  Location: [facility];  Service: Urology;  Laterality: Left;   Family History  Problem Relation Age of Onset  . Hyperlipidemia Mother   . Hyperlipidemia Brother   . Hyperlipidemia Brother   . Colon cancer Neg Hx    History  Substance Use Topics  . Smoking status: Former Smoker    Quit date: 01/31/1991  . Smokeless tobacco: Never Used  . Alcohol Use: Yes     Comment: occ   Current Outpatient Prescriptions  Medication Sig Dispense Refill  . carvedilol (COREG) 12.5 MG tablet Take 1 tablet (12.5 mg total) by mouth 2 (two) times daily.  180 tablet  3  . Multiple Vitamin (MULTIVITAMIN) tablet Take 1 tablet by mouth daily.        . colestipol (COLESTID) 1 G tablet Take 2 tablets (2 g total) by mouth 2 (  two) times daily.  120 tablet  6  . dicyclomine (BENTYL) 10 MG capsule Take 1 capsule (10 mg total) by mouth 4 (four) times daily -  before meals and at bedtime.  90 capsule  1   No current facility-administered medications for this visit.   Allergies  Allergen Reactions  . Codeine Nausea And Vomiting  . Penicillins     REACTION: hives      Review of Systems: Gen: Denies any fever, chills, sweats, anorexia, fatigue, weakness, malaise, weight loss, and sleep disorder CV: Denies chest pain, angina, palpitations, syncope, orthopnea, PND, peripheral edema, and claudication. Resp: Denies dyspnea at rest, dyspnea with  exercise, cough, sputum, wheezing, coughing up blood, and pleurisy. GI: Denies vomiting blood, jaundice, and fecal incontinence.   Denies dysphagia or odynophagia. GU : Denies urinary burning, blood in urine. MS:neg Derm: neg Psych: neg Heme: neg Neuro:neg Endo: neg    Physical Exam: General: Pleasant, well developed , well nourished female in no acute distress Head: Normocephalic and atraumatic Eyes:  sclerae anicteric, conjunctiva pink  Ears: Normal auditory acuity Lungs: Clear throughout to auscultation Heart: Regular rate and rhythm Abdomen: Soft, non distended, mild diffuse right sided tenderness. No masses, no hepatomegaly. Normal bowel sounds. No rebound or guarding. No CVAT Rectal:deferred Musculoskeletal: Symmetrical with no gross deformities  Extremities: No edema  Neurological: Alert oriented x 4, grossly nonfocal Psychological:  Alert and cooperative. Normal mood and affect  Assessment and Recommendations: 43 year old female status post cholecystectomy in 2007 with ongoing complaints of loose stools and right-sided abdominal pain. As she had some relief from cholestyramine in the past, we will give her a trial of colestipol 2 g twice daily. She will also be given a trial of Bentyl 10 mg 3 times a day. A CBC and comprehensive metabolic panel will be obtained. If she is noted to have elevated transaminases an abdominal ultrasound will be obtained. A urinalysis will be obtained and her urine will be sent for urine C&S said she has been reporting cloudy urine. She will be sent for a KUB today as well due to the fact that she has had recurrent kidney stones. She has been advised to push fluids. She will followup in 4 weeks, sooner as needed.         Ms.E@ PA-C @TD

## 2013-11-18 NOTE — Patient Instructions (Signed)
We have sent the following medications to your pharmacy for you to pick up at your convenience: Colestid 1 gram, please take two tablets by mouth twice daily  Bentyl 10 mg, please take one tablet by mouth three times daily   Your physician has requested that you go to the basement for the following lab work before leaving today: [place]   Please go to the basement for your abdominal X-Ray before leaving today   Please call at the end of this month to make a follow up visit with Cecille Rubin for 12-15-2013

## 2013-11-19 LAB — URINE CULTURE
COLONY COUNT: NO GROWTH
Organism ID, Bacteria: NO GROWTH

## 2014-04-27 ENCOUNTER — Encounter: Payer: Self-pay | Admitting: Primary Care

## 2014-04-27 ENCOUNTER — Ambulatory Visit (INDEPENDENT_AMBULATORY_CARE_PROVIDER_SITE_OTHER)
Admission: RE | Admit: 2014-04-27 | Discharge: 2014-04-27 | Disposition: A | Discharge status: Home / Self Care | Payer: BLUE CROSS/BLUE SHIELD | Source: Ambulatory Visit | Attending: Primary Care | Admitting: Primary Care

## 2014-04-27 ENCOUNTER — Telehealth: Payer: Self-pay | Admitting: Primary Care

## 2014-04-27 ENCOUNTER — Ambulatory Visit (INDEPENDENT_AMBULATORY_CARE_PROVIDER_SITE_OTHER): Payer: BLUE CROSS/BLUE SHIELD | Admitting: Primary Care

## 2014-04-27 VITALS — BP 122/82 | HR 104 | Temp 101.0°F | Ht 70.0 in | Wt 218.8 lb

## 2014-04-27 DIAGNOSIS — R05 Cough: Secondary | ICD-10-CM

## 2014-04-27 DIAGNOSIS — J209 Acute bronchitis, unspecified: Secondary | ICD-10-CM

## 2014-04-27 DIAGNOSIS — R059 Cough, unspecified: Secondary | ICD-10-CM

## 2014-04-27 MED ORDER — LEVOFLOXACIN 500 MG PO TABS: 500.0000 mg | ORAL_TABLET | Freq: Every day | ORAL | Status: DC

## 2014-04-27 MED ORDER — HYDROCODONE-HOMATROPINE 5-1.5 MG/5ML PO SYRP: 5.0000 mL | ORAL_SOLUTION | Freq: Three times a day (TID) | ORAL | Status: DC | PRN

## 2014-04-27 MED ORDER — LEVOFLOXACIN 750 MG PO TABS: 750.0000 mg | ORAL_TABLET | Freq: Every day | ORAL | Status: DC

## 2014-04-27 NOTE — Telephone Encounter (Signed)
Called and spoken to patient. Notified her of Kate's comments. Patient verbalized understanding. [facility] and confirm to [facility] Levaquin 500 mg not the 750 mg. Pharmacist confirm fill 500 mg.

## 2014-04-27 NOTE — Progress Notes (Signed)
Subjective:    Patient ID: Kendra [name], female    DOB: 07-19-1970, 44 y.o.   MRN: [id number]  HPI  Kendra [name] is a 44 year old female who presents today with a chief complaint of cough. Her cough has been productive with thick, green/yellow mucous mostly that has been present for one month. Her symptoms began with a sore throat and cough, so she presented to and Urgent Care center where they prescribed Doxycycline and Prednisone. Several days after treatment her cough returned and has worsened over the past several days. She is febrile today and has complaints of nasal congestion, being extremely tired from her violent cough.  She's tried taking Robitussin, Mucinex, Dayquil, Tessalon pearls from her son's RX, Doxycycline, and prednisone without relief. She has an albuterol inhaler already for which she's reporting relief from shortness of breath.  Review of Systems  Constitutional: Positive for fever and chills. Negative for fatigue.  HENT: Positive for ear pain, rhinorrhea, sinus pressure and sore throat.   Respiratory: Positive for chest tightness, shortness of breath and wheezing.   Cardiovascular: Negative for chest pain.  Gastrointestinal: Negative for nausea, vomiting and abdominal pain.  Musculoskeletal: Positive for myalgias.  Neurological: Positive for headaches.       Past Medical History  Diagnosis Date  . Depression   . Hyperlipidemia   . Chronic headache   . Kidney stones   . Former smoker     quit in 66  . Left bundle branch block   . Systolic dysfunction, left ventricle     EF is 43% per Myoview; dyssynchrony of the septum noted on echo; has LBBB  . Obesity     History   Social History  . Marital Status: Divorced    Spouse Name: N/A  . Number of Children: N/A  . Years of Education: N/A   Occupational History  . [employer]    Social History Main Topics  . Smoking status: Former Smoker    Quit date: 01/31/1991  . Smokeless tobacco: Never Used    . Alcohol Use: Yes     Comment: occ  . Drug Use: No  . Sexual Activity: Not on file   Other Topics Concern  . Not on file   Social History Narrative   Daily caffeine use:  Sweet Tea daily      Patient does not get regular exercise    Past Surgical History  Procedure Laterality Date  . Wrist surgery  11/2001  . Foot surgery  12/2005  . Cholecystectomy  2009  . Tonsillectomy  03/2010  . Cystoscopy with retrograde pyelogram, ureteroscopy and stent placement  03/06/2012    Procedure: CYSTOSCOPY WITH RETROGRADE PYELOGRAM, URETEROSCOPY AND STENT PLACEMENT;  Surgeon: Molli Hazard, MD;  Location: [facility];  Service: Urology;  Laterality: Left;  . Cystoscopy with retrograde pyelogram, ureteroscopy and stent placement Left 03/20/2012    Procedure: CYSTOSCOPY WITH RETROGRADE PYELOGRAM, URETEROSCOPY AND STENT PLACEMENT;  Surgeon: Molli Hazard, MD;  Location: [facility];  Service: Urology;  Laterality: Left;  balloon dilitation  . Holmium laser application Left 09/30/5174    Procedure: HOLMIUM LASER APPLICATION;  Surgeon: Molli Hazard, MD;  Location: [facility];  Service: Urology;  Laterality: Left;    Family History  Problem Relation Age of Onset  . Hyperlipidemia Mother   . Hyperlipidemia Brother   . Hyperlipidemia Brother   . Colon cancer Neg Hx     Allergies  Allergen Reactions  . Codeine Nausea And Vomiting  . Penicillins     REACTION: hives    Current Outpatient Prescriptions on File Prior to Visit  Medication Sig Dispense Refill  . Multiple Vitamin (MULTIVITAMIN) tablet Take 1 tablet by mouth daily.      . carvedilol (COREG) 12.5 MG tablet Take 1 tablet (12.5 mg total) by mouth 2 (two) times daily. 180 tablet 3   No current facility-administered medications on file prior to visit.    BP 122/82 mmHg  Pulse 104  Temp(Src) 101 F (38.3 C) (Oral)  Ht 5\' 10"  (1.778 m)  Wt 218 lb 12.8 oz (99.247 kg)   BMI 31.39 kg/m2  SpO2 94%    Objective:   Physical Exam  Constitutional: She is oriented to person, place, and time.  Eyes: Conjunctivae are normal. Pupils are equal, round, and reactive to light.  Neck: Neck supple.  Cardiovascular: Regular rhythm and normal heart sounds.   Sinus tachycardia at 105  Pulmonary/Chest: Effort normal. No accessory muscle usage. No respiratory distress. She has wheezes in the right upper field, the right lower field, the left upper field and the left lower field. She has rhonchi in the right upper field, the right lower field, the left upper field and the left lower field. She has rales in the right upper field, the right lower field, the left upper field and the left lower field. She exhibits no tenderness.  Lymphadenopathy:    She has no cervical adenopathy.  Neurological: She is alert and oriented to person, place, and time.  Skin: Skin is warm and dry.  Psychiatric: She has a normal mood and affect.          Assessment & Plan:

## 2014-04-27 NOTE — Telephone Encounter (Signed)
Please notify Ms. Intriago that her xray did not show pneumonia; however I believe she has an acute bacterial bronchitis. I have changed her antibiotic at the pharmacy to Levaquin 500mg  tablets, one daily for 7 days. Will you please notify her of this change and let the pharmacy know that I have made this switch?  Tell her to call me if no improvement in 3-4 days.  Thank you!

## 2014-04-27 NOTE — Progress Notes (Signed)
Pre visit review using our clinic review tool, if applicable. No additional management support is needed unless otherwise documented below in the visit note. 

## 2014-04-27 NOTE — Patient Instructions (Addendum)
Obtain xray prior to leaving. Start Levaquin 500mg  daily for 7 days. Start Hycodan cough syrup three times daily as needed for cough. Continue to use your inhaler for wheezing or shortness of breath. Follow up if no better in 3-4 days.

## 2014-04-28 ENCOUNTER — Telehealth: Payer: Self-pay

## 2014-04-28 DIAGNOSIS — J209 Acute bronchitis, unspecified: Secondary | ICD-10-CM | POA: Insufficient documentation

## 2014-04-28 NOTE — Telephone Encounter (Signed)
Left message for pt to call back if she still needs flu vaccine

## 2014-04-28 NOTE — Assessment & Plan Note (Addendum)
Due to presentation, ROS, and findings on examination, I suspect this is likely bacterial. Chest xray negative for pneumonia. Due to failure with Doxycycline, RX provided for Levaquin daily for 7 days and Hycodan to help with cough/sleep. She has an albuterol inhaler currently, and will continue to take Mucinex. She is to follow up if no improvement in 3-4 days.

## 2014-05-01 ENCOUNTER — Telehealth: Payer: Self-pay | Admitting: *Deleted

## 2014-05-01 MED ORDER — PREDNISONE 20 MG PO TABS: ORAL_TABLET | ORAL | Status: DC

## 2014-05-01 NOTE — Telephone Encounter (Signed)
Prednisone sent in.

## 2014-05-01 NOTE — Telephone Encounter (Signed)
Already completed doxy course earlier in the illness. levaquin is a strong antibiotic, wouldn't recommend change but rather finish abx course and update [place] on Monday with effect. While she was on prednisone was there any noted improvement? If so could do another prednisone course esp if significant persistent wheezing. How is hycodan cough syrup? If not effective could try stronger tussionex pennkinetic.

## 2014-05-01 NOTE — Telephone Encounter (Signed)
Pt calls c/o no improvement after being on abx x 5 days. She saw Anda Kraft on 3/28 for bronchitis, was prescribed Levaquin and cough syrup. She ran a fever during the week, but is afebrile today. Her cough was productive but, now it is a dry hacking cough.  She is using inhaler, but still wheezing a lot. She previously had a sore throat, but doesn't any longer. She request a different abx be called in for her. [facility] [place] [facility] [address]

## 2014-05-01 NOTE — Telephone Encounter (Signed)
Patient notified and will finish abx. The hycodan is working fine and she doesn't need anything any stronger. She does request the prednisone as it seemed to help the last time.

## 2014-07-28 ENCOUNTER — Encounter: Payer: Self-pay | Admitting: Cardiovascular Disease

## 2014-07-28 ENCOUNTER — Ambulatory Visit (INDEPENDENT_AMBULATORY_CARE_PROVIDER_SITE_OTHER): Payer: 59 | Admitting: Cardiovascular Disease

## 2014-07-28 VITALS — BP 124/68 | HR 74 | Ht 70.0 in | Wt 213.0 lb

## 2014-07-28 DIAGNOSIS — I447 Left bundle-branch block, unspecified: Secondary | ICD-10-CM

## 2014-07-28 DIAGNOSIS — I5022 Chronic systolic (congestive) heart failure: Secondary | ICD-10-CM | POA: Diagnosis not present

## 2014-07-28 NOTE — Progress Notes (Signed)
Kendra [name] Date of Birth  1971-01-24 [place] [address]    [place]   [place], [place]  [address]    [place], [place]  [address] [phone]  Fax  [phone]  [phone]  Fax [phone]  Problem List: 1. Left bundle branch block 2. Chest pain 3. Kidney stone 4. Palpitations  5. Mild chronic systolic CHF - in the setting of LBBB.  EF 43% by myoview in April  2013.    History of Present Illness:  Kendra [name]  is a 44 y.o. female. She presented to her medical Dr. with some upper left-sided abdominal pain/breast pain.  This pain is occasionally pleuritic. She was thought to have a pulmonary illness. A d-dimer was subsequent on and found to be negative the An EKG showed left bundle branch block.   A subsequent echocardiogram revealed dyssynchrony of her septum.  She does not exercise on regular basis. She walks occasionally. She's been in the process of moving. She has noticed that she's a little bit more short of breath moving boxes. She's also noticed some left arm tingling and numbness which radiates down to her fingers.  We performed a Lexiscan Myoview which revealed an EF of 43%.  Overall Impression: Low risk stress nuclear study. No ischemia or infarct. Baseline ECG LBBB with decreased EF  LV Ejection Fraction: 43%. LV Wall Motion: Apical hypokinesis  We initially had her on Toprol-XL but she did not tolerate it. She felt very lethargic. We now have her on carvedilol which he seems to tolerate much better.  She's exercising on occasion.  She has been measuring her BP on occasion.  Her readings have been ok but several have been elevated.  Feb. 27, 2014:  She has had several kidney surgeries since I last saw her.  She had some chest pressure when she woke up for anesthesia. She has had some bad indigestion since then.  Better with antiacids.  She has not been exercising much for the past month.  Her BP has been  well controlled.    Nov. 10, 2014:  She is doing well. Having some PVCs on occasion.  She has been walking some.  She has had occaisonal ankle swelling.    No edema today.  She has a new job.   April 22, 2013:    Kendra [name]  presents today for further evaluation of palpitations. She's been under a little bit more stress than usual. She's also been fatigued.   We placed a Holter monitor. She had sinus tachycardia as well as sinus bradycardia. She has an underlying bundle branch block. There were no arrhythmias to suggest SVT   July 07, 2013:  Kendra [name] is doing well.  Able to do all of her normal activities without problems. Trying to exercise.  She has not had many palpitations.  Sleeping better.    Wants to delay getting the sleep study.  July 28, 2014:  Doing great . Not many palpitations  Exercising  - walking several times a week .   Doing great.  Has lost 8 lbs.    Current Outpatient Prescriptions on File Prior to Visit  Medication Sig Dispense Refill  . carvedilol (COREG) 12.5 MG tablet Take 1 tablet (12.5 mg total) by mouth 2 (two) times daily. 180 tablet 3  . HYDROcodone-homatropine (HYCODAN) 5-1.5 MG/5ML syrup Take 5 mLs by mouth every 8 (eight) hours as needed for cough. 150 mL 0  .  Multiple Vitamin (MULTIVITAMIN) tablet Take 1 tablet by mouth daily.       No current facility-administered medications on file prior to visit.    Allergies  Allergen Reactions  . Codeine Nausea And Vomiting  . Penicillins     REACTION: hives    Past Medical History  Diagnosis Date  . Depression   . Hyperlipidemia   . Chronic headache   . Kidney stones   . Former smoker     quit in 75  . Left bundle branch block   . Systolic dysfunction, left ventricle     EF is 43% per Myoview; dyssynchrony of the septum noted on echo; has LBBB  . Obesity     Past Surgical History  Procedure Laterality Date  . Wrist surgery  11/2001  . Foot surgery  12/2005  . Cholecystectomy  2009  .  Tonsillectomy  03/2010  . Cystoscopy with retrograde pyelogram, ureteroscopy and stent placement  03/06/2012    Procedure: CYSTOSCOPY WITH RETROGRADE PYELOGRAM, URETEROSCOPY AND STENT PLACEMENT;  Surgeon: Molli Hazard, MD;  Location: [facility];  Service: Urology;  Laterality: Left;  . Cystoscopy with retrograde pyelogram, ureteroscopy and stent placement Left 03/20/2012    Procedure: CYSTOSCOPY WITH RETROGRADE PYELOGRAM, URETEROSCOPY AND STENT PLACEMENT;  Surgeon: Molli Hazard, MD;  Location: [facility];  Service: Urology;  Laterality: Left;  balloon dilitation  . Holmium laser application Left 10/15/9448    Procedure: HOLMIUM LASER APPLICATION;  Surgeon: Molli Hazard, MD;  Location: [facility];  Service: Urology;  Laterality: Left;    History  Smoking status  . Former Smoker  . Quit date: 01/31/1991  Smokeless tobacco  . Never Used    History  Alcohol Use  . Yes    Comment: occ    Family History  Problem Relation Age of Onset  . Hyperlipidemia Mother   . Hyperlipidemia Brother   . Hyperlipidemia Brother   . Colon cancer Neg Hx     Reviw of Systems:  Reviewed in the HPI.  All other systems are negative.  Physical Exam: Blood pressure 124/68, pulse 74, height 5\' 10"  (1.778 m), weight 96.616 kg (213 lb). General: Well developed, well nourished, in no acute distress.  Head: Normocephalic, atraumatic, sclera non-icteric, mucus membranes are moist,   Neck: Supple. Carotids are 2 + without bruits. No JVD  Lungs: Clear bilaterally to auscultation.  Heart: regular rate.  normal  S1 S2. No murmurs, gallops or rubs.  Abdomen: Soft, non-tender, non-distended with normal bowel sounds. No hepatomegaly. No rebound/guarding. No masses.  Msk:  Strength and tone are normal  Extremities: No clubbing or cyanosis. No edema.  Distal pedal pulses are 2+ and equal bilaterally.  Neuro: Alert and oriented X 3. Moves all  extremities spontaneously.  Psych:  Responds to questions appropriately with a normal affect.  ECG: July 28, 2014:  NSR at 74.  NS IVCD.    Assessment / Plan:   1. Left bundle branch block - stable .  Has had some mild systolic CHF. At this point she remains completely asymptomatic.  2. Mild chronic systolic CHF - in the setting of LBBB.  EF 43% by myoview in April  2013. She was on carvedilol but it made her feel fatigued. She stop the carvedilol approximately 6-8 months ago. She's been exercising on a regular basis. At this point she does not necessarily want to restart the carvedilol. She has very mild left ventricular systolic function  by Myoview study several years ago.  I've challenged her to really work hard on an exercise program and weight loss program. If she's able to exercise vigorously without any significant shortness of breath did not think that we can assume that her LV function is still close to the normal range.    I think that she will need another echo in several years -  3. Kidney stone 4. Palpitations  5. Chest pain    Nahser, Wonda Cheng, MD  07/28/2014 9:12 AM    [place],  [place], [place]  [address] Pager [phone] Phone: [phone]; Fax: [phone]   [facility]  [address] [place], [place]  [address] [phone]    Fax [phone]

## 2014-07-28 NOTE — Patient Instructions (Signed)
Medication Instructions:  Your physician recommends that you continue on your current medications as directed. Please refer to the Current Medication list given to you today.   Labwork: None Ordered   Testing/Procedures: None Ordered   Follow-Up: Your physician wants you to follow-up in: 1 year with Dr. Nahser.  You will receive a reminder letter in the mail two months in advance. If you don't receive a letter, please call our office to schedule the follow-up appointment.      

## 2015-03-10 DIAGNOSIS — H6990 Unspecified Eustachian tube disorder, unspecified ear: Secondary | ICD-10-CM | POA: Insufficient documentation

## 2015-08-06 ENCOUNTER — Ambulatory Visit: Payer: 59 | Admitting: Cardiovascular Disease

## 2015-09-16 DIAGNOSIS — S62657A Nondisplaced fracture of medial phalanx of left little finger, initial encounter for closed fracture: Secondary | ICD-10-CM | POA: Diagnosis not present

## 2015-09-16 DIAGNOSIS — S63502A Unspecified sprain of left wrist, initial encounter: Secondary | ICD-10-CM | POA: Diagnosis not present

## 2015-09-21 DIAGNOSIS — M25532 Pain in left wrist: Secondary | ICD-10-CM | POA: Diagnosis not present

## 2015-10-01 ENCOUNTER — Ambulatory Visit (INDEPENDENT_AMBULATORY_CARE_PROVIDER_SITE_OTHER): Payer: BLUE CROSS/BLUE SHIELD | Admitting: Cardiovascular Disease

## 2015-10-01 ENCOUNTER — Encounter: Payer: Self-pay | Admitting: Cardiovascular Disease

## 2015-10-01 VITALS — BP 124/76 | HR 65 | Ht 70.0 in | Wt 204.8 lb

## 2015-10-01 DIAGNOSIS — I5022 Chronic systolic (congestive) heart failure: Secondary | ICD-10-CM | POA: Diagnosis not present

## 2015-10-01 DIAGNOSIS — I447 Left bundle-branch block, unspecified: Secondary | ICD-10-CM | POA: Diagnosis not present

## 2015-10-01 NOTE — Progress Notes (Signed)
Kendra [name] Date of Birth  19-Mar-1970 [place] [address]    [place], [place]  [address]    [place], [place]  [address] [phone]  Fax  [phone]  [phone]  Fax [phone]  Problem List: 1. Left bundle branch block 2. Chest pain 3. Kidney stone 4. Palpitations  5. Mild chronic systolic CHF - in the setting of LBBB.  EF 43% by myoview in April  2013.    History of Present Illness:  Kendra [name]  is a 45 y.o. female. She presented to her medical Dr. with some upper left-sided abdominal pain/breast pain.  This pain is occasionally pleuritic. She was thought to have a pulmonary illness. A d-dimer was subsequent on and found to be negative the An EKG showed left bundle branch block.   A subsequent echocardiogram revealed dyssynchrony of her septum.  She does not exercise on regular basis. She walks occasionally. She's been in the process of moving. She has noticed that she's a little bit more short of breath moving boxes. She's also noticed some left arm tingling and numbness which radiates down to her fingers.  We performed a Lexiscan Myoview which revealed an EF of 43%.  Overall Impression: Low risk stress nuclear study. No ischemia or infarct. Baseline ECG LBBB with decreased EF  LV Ejection Fraction: 43%. LV Wall Motion: Apical hypokinesis  We initially had her on Toprol-XL but she did not tolerate it. She felt very lethargic. We now have her on carvedilol which he seems to tolerate much better.  She's exercising on occasion.  She has been measuring her BP on occasion.  Her readings have been ok but several have been elevated.  Feb. 27, 2014:  She has had several kidney surgeries since I last saw her.  She had some chest pressure when she woke up for anesthesia. She has had some bad indigestion since then.  Better with antiacids.  She has not been exercising much for the past month.  Her BP has been  well controlled.    Nov. 10, 2014:  She is doing well. Having some PVCs on occasion.  She has been walking some.  She has had occaisonal ankle swelling.    No edema today.  She has a new job.   April 22, 2013:    Kendra [name]  presents today for further evaluation of palpitations. She's been under a little bit more stress than usual. She's also been fatigued.   We placed a Holter monitor. She had sinus tachycardia as well as sinus bradycardia. She has an underlying bundle branch block. There were no arrhythmias to suggest SVT   July 07, 2013:  Kendra [name] is doing well.  Able to do all of her normal activities without problems. Trying to exercise.  She has not had many palpitations.  Sleeping better.    Wants to delay getting the sleep study.  July 28, 2014:  Doing great . Not many palpitations  Exercising  - walking several times a week .   Doing great.  Has lost 8 lbs.   Sept. 1, 2017:  Doing well No CP , no dyspnea Walking ,  Has lost 9 lbs since her last visit .      No current outpatient prescriptions on file prior to visit.   No current facility-administered medications on file prior to visit.     Allergies  Allergen Reactions  . Codeine  Nausea And Vomiting  . Penicillins     REACTION: hives    Past Medical History:  Diagnosis Date  . Chronic headache   . Depression   . Former smoker    quit in 83  . Hyperlipidemia   . Kidney stones   . Left bundle branch block   . Obesity   . Systolic dysfunction, left ventricle    EF is 43% per Myoview; dyssynchrony of the septum noted on echo; has LBBB    Past Surgical History:  Procedure Laterality Date  . CHOLECYSTECTOMY  2009  . CYSTOSCOPY WITH RETROGRADE PYELOGRAM, URETEROSCOPY AND STENT PLACEMENT  03/06/2012   Procedure: CYSTOSCOPY WITH RETROGRADE PYELOGRAM, URETEROSCOPY AND STENT PLACEMENT;  Surgeon: Molli Hazard, MD;  Location: [facility];  Service: Urology;  Laterality: Left;  . CYSTOSCOPY WITH  RETROGRADE PYELOGRAM, URETEROSCOPY AND STENT PLACEMENT Left 03/20/2012   Procedure: CYSTOSCOPY WITH RETROGRADE PYELOGRAM, URETEROSCOPY AND STENT PLACEMENT;  Surgeon: Molli Hazard, MD;  Location: [facility];  Service: Urology;  Laterality: Left;  balloon dilitation  . FOOT SURGERY  12/2005  . HOLMIUM LASER APPLICATION Left [id number]   Procedure: HOLMIUM LASER APPLICATION;  Surgeon: Molli Hazard, MD;  Location[facility];  Service: Urology;  Laterality: Left;  . TONSILLECTOMY  03/2010  . WRIST SURGERY  11/2001    History  Smoking Status  . Former Smoker  . Quit date: 01/31/1991  Smokeless Tobacco  . Never Used    History  Alcohol Use  . Yes    Comment: occ    Family History  Problem Relation Age of Onset  . Hyperlipidemia Mother   . Hyperlipidemia Brother   . Hyperlipidemia Brother   . Colon cancer Neg Hx     Reviw of Systems:  Reviewed in the HPI.  All other systems are negative.  Physical Exam: Blood pressure 124/76, pulse 65, height 5\' 10"  (1.778 m), weight 204 lb 12.8 oz (92.9 kg). General: Well developed, well nourished, in no acute distress.  Head: Normocephalic, atraumatic, sclera non-icteric, mucus membranes are moist,   Neck: Supple. Carotids are 2 + without bruits. No JVD  Lungs: Clear bilaterally to auscultation.  Heart: regular rate.  normal  S1 S2. No murmurs, gallops or rubs.  Abdomen: Soft, non-tender, non-distended with normal bowel sounds. No hepatomegaly. No rebound/guarding. No masses.  Msk:  Strength and tone are normal  Extremities: No clubbing or cyanosis. No edema.  Distal pedal pulses are 2+ and equal bilaterally.  Neuro: Alert and oriented X 3. Moves all extremities spontaneously.  Psych:  Responds to questions appropriately with a normal affect.  ECG: Sept. 1, 2017:  NSR at 65.   NS IVCD.   Assessment / Plan:   1. Left bundle branch block - stable .  Has had some mild systolic CHF. At  this point she remains completely asymptomatic.  2. Mild chronic systolic CHF - in the setting of LBBB.  EF 43% by myoview in April  2013. She was on carvedilol but it made her feel fatigued.  The echo showed EF of 50-55%. Will consider getting an echo next year .   She will call us if she wants to get the echo this year. .   3. Kidney stone 4. Palpitations  5. Chest pain    Mertie Moores, MD  10/01/2015 9:10 AM    [facility] [place],  [place], [place]  [address] Pager [phone] Phone: [phone]; Fax: [phone]

## 2015-10-01 NOTE — Patient Instructions (Signed)
Medication Instructions:  Your physician recommends that you continue on your current medications as directed. Please refer to the Current Medication list given to you today.   Labwork: None Ordered   Testing/Procedures: None Ordered   Follow-Up: Your physician wants you to follow-up in: 1 year with Dr. Nahser.  You will receive a reminder letter in the mail two months in advance. If you don't receive a letter, please call our office to schedule the follow-up appointment.   If you need a refill on your cardiac medications before your next appointment, please call your pharmacy.   Thank you for choosing [facility]! Kelten Enochs, RN [phone]    

## 2015-10-06 DIAGNOSIS — S62002A Unspecified fracture of navicular [scaphoid] bone of left wrist, initial encounter for closed fracture: Secondary | ICD-10-CM | POA: Diagnosis not present

## 2015-11-03 DIAGNOSIS — M19032 Primary osteoarthritis, left wrist: Secondary | ICD-10-CM | POA: Diagnosis not present

## 2015-12-06 DIAGNOSIS — M25562 Pain in left knee: Secondary | ICD-10-CM | POA: Diagnosis not present

## 2016-03-15 DIAGNOSIS — H6982 Other specified disorders of Eustachian tube, left ear: Secondary | ICD-10-CM | POA: Diagnosis not present

## 2016-03-15 DIAGNOSIS — J0100 Acute maxillary sinusitis, unspecified: Secondary | ICD-10-CM | POA: Diagnosis not present

## 2016-04-12 DIAGNOSIS — J342 Deviated nasal septum: Secondary | ICD-10-CM | POA: Diagnosis not present

## 2016-04-12 DIAGNOSIS — J0101 Acute recurrent maxillary sinusitis: Secondary | ICD-10-CM | POA: Diagnosis not present

## 2016-05-11 ENCOUNTER — Telehealth: Payer: Self-pay | Admitting: Physician Assistant

## 2016-05-11 NOTE — Telephone Encounter (Signed)
Patient called answering service extremely concerned about high blood pressure. Wrist cuff read 178/158. By the time I returned her call she states she thinks she's fine and the cuff malfunctioned. She said she got really worked up at the high reading and has since checked it 10 more times, but most recent reading after calming down was 121/71. She reports increased stress lately but I do suspect a cuff malfunction as she was getting readings that were not really making sense. She feels fine otherwise. I told her that wrist cuffs often are inaccurate and I would recommend considering an arm cuff. Advised she can call our office in AM to schedule formal BP check if she remains concerned. She was grateful for call.  Manolito Jurewicz PA-C

## 2016-05-12 NOTE — Telephone Encounter (Signed)
Agree that this sounds like a BP cuff malfunction.

## 2016-05-22 DIAGNOSIS — Z6831 Body mass index (BMI) 31.0-31.9, adult: Secondary | ICD-10-CM | POA: Diagnosis not present

## 2016-05-22 DIAGNOSIS — Z3202 Encounter for pregnancy test, result negative: Secondary | ICD-10-CM | POA: Diagnosis not present

## 2016-05-22 DIAGNOSIS — Z1389 Encounter for screening for other disorder: Secondary | ICD-10-CM | POA: Diagnosis not present

## 2016-05-22 DIAGNOSIS — Z130 Encounter for screening for diseases of the blood and blood-forming organs and certain disorders involving the immune mechanism: Secondary | ICD-10-CM | POA: Diagnosis not present

## 2016-05-22 DIAGNOSIS — Z30430 Encounter for insertion of intrauterine contraceptive device: Secondary | ICD-10-CM | POA: Diagnosis not present

## 2016-05-22 DIAGNOSIS — Z01419 Encounter for gynecological examination (general) (routine) without abnormal findings: Secondary | ICD-10-CM | POA: Diagnosis not present

## 2016-05-22 DIAGNOSIS — N921 Excessive and frequent menstruation with irregular cycle: Secondary | ICD-10-CM | POA: Diagnosis not present

## 2016-07-06 DIAGNOSIS — R05 Cough: Secondary | ICD-10-CM | POA: Diagnosis not present

## 2016-07-06 DIAGNOSIS — J019 Acute sinusitis, unspecified: Secondary | ICD-10-CM | POA: Diagnosis not present

## 2016-08-18 ENCOUNTER — Ambulatory Visit: Payer: BLUE CROSS/BLUE SHIELD | Admitting: Physician Assistant

## 2016-08-20 ENCOUNTER — Emergency Department (HOSPITAL_BASED_OUTPATIENT_CLINIC_OR_DEPARTMENT_OTHER): Payer: BLUE CROSS/BLUE SHIELD

## 2016-08-20 ENCOUNTER — Emergency Department (HOSPITAL_BASED_OUTPATIENT_CLINIC_OR_DEPARTMENT_OTHER)
Admission: EM | Admit: 2016-08-20 | Discharge: 2016-08-20 | Disposition: A | Discharge status: Home / Self Care | Payer: BLUE CROSS/BLUE SHIELD | Attending: Emergency Medicine | Admitting: Emergency Medicine

## 2016-08-20 ENCOUNTER — Encounter (HOSPITAL_BASED_OUTPATIENT_CLINIC_OR_DEPARTMENT_OTHER): Payer: Self-pay | Admitting: Emergency Medicine

## 2016-08-20 DIAGNOSIS — R1031 Right lower quadrant pain: Secondary | ICD-10-CM | POA: Diagnosis present

## 2016-08-20 DIAGNOSIS — Z87891 Personal history of nicotine dependence: Secondary | ICD-10-CM | POA: Diagnosis not present

## 2016-08-20 DIAGNOSIS — R109 Unspecified abdominal pain: Secondary | ICD-10-CM | POA: Diagnosis not present

## 2016-08-20 DIAGNOSIS — N390 Urinary tract infection, site not specified: Secondary | ICD-10-CM

## 2016-08-20 DIAGNOSIS — N200 Calculus of kidney: Secondary | ICD-10-CM | POA: Insufficient documentation

## 2016-08-20 LAB — URINALYSIS, ROUTINE W REFLEX MICROSCOPIC
Bilirubin Urine: NEGATIVE
Glucose, UA: NEGATIVE mg/dL
Ketones, ur: NEGATIVE mg/dL
Nitrite: NEGATIVE
PROTEIN: NEGATIVE mg/dL
Specific Gravity, Urine: 1.015 (ref 1.005–1.030)
pH: 5.5 (ref 5.0–8.0)

## 2016-08-20 LAB — URINALYSIS, MICROSCOPIC (REFLEX)

## 2016-08-20 LAB — CBC
HEMATOCRIT: 41.6 % (ref 36.0–46.0)
Hemoglobin: 14 g/dL (ref 12.0–15.0)
MCH: 30.2 pg (ref 26.0–34.0)
MCHC: 33.7 g/dL (ref 30.0–36.0)
MCV: 89.8 fL (ref 78.0–100.0)
Platelets: 319 10*3/uL (ref 150–400)
RBC: 4.63 MIL/uL (ref 3.87–5.11)
RDW: 12.7 % (ref 11.5–15.5)
WBC: 7.5 10*3/uL (ref 4.0–10.5)

## 2016-08-20 LAB — BASIC METABOLIC PANEL
Anion gap: 11 (ref 5–15)
BUN: 12 mg/dL (ref 6–20)
CALCIUM: 9.4 mg/dL (ref 8.9–10.3)
CO2: 27 mmol/L (ref 22–32)
Chloride: 103 mmol/L (ref 101–111)
Creatinine, Ser: 0.98 mg/dL (ref 0.44–1.00)
GFR calc Af Amer: >60 mL/min (ref >60)
Glucose, Bld: 88 mg/dL (ref 65–99)
POTASSIUM: 4.3 mmol/L (ref 3.5–5.1)
Sodium: 141 mmol/L (ref 135–145)

## 2016-08-20 MED ORDER — IBUPROFEN 800 MG PO TABS: 800.0000 mg | ORAL_TABLET | Freq: Three times a day (TID) | ORAL | 0 refills | Status: DC

## 2016-08-20 MED ORDER — CEPHALEXIN 500 MG PO CAPS: 500.0000 mg | ORAL_CAPSULE | Freq: Four times a day (QID) | ORAL | 0 refills | Status: AC

## 2016-08-20 MED ORDER — KETOROLAC TROMETHAMINE 30 MG/ML IJ SOLN
30.0000 mg | Freq: Once | INTRAMUSCULAR | Status: AC
  Administered 2016-08-20: 30 mg via INTRAVENOUS
  Filled 2016-08-20: qty 1

## 2016-08-20 MED ORDER — ONDANSETRON HCL 4 MG PO TABS: 4.0000 mg | ORAL_TABLET | Freq: Four times a day (QID) | ORAL | 0 refills | Status: DC

## 2016-08-20 MED ORDER — CEPHALEXIN 250 MG PO CAPS
500.0000 mg | ORAL_CAPSULE | Freq: Once | ORAL | Status: AC
  Administered 2016-08-20: 500 mg via ORAL
  Filled 2016-08-20: qty 2

## 2016-08-20 MED ORDER — ONDANSETRON HCL 4 MG/2ML IJ SOLN
4.0000 mg | Freq: Once | INTRAMUSCULAR | Status: AC | PRN
  Administered 2016-08-20: 4 mg via INTRAVENOUS
  Filled 2016-08-20: qty 2

## 2016-08-20 NOTE — ED Provider Notes (Signed)
[facility] DEPT [facility] Provider Note   CSN: [id number] Arrival date & time: 08/20/16  1240     History   Chief Complaint Chief Complaint  Patient presents with  . Flank Pain    HPI Kendra [name] is a 46 y.o. female who presents with right flank pain. Patient reports she initially had the pain intermittently for the past 2 months, however began to get worse over the past week. She developed urinary urgency and dysuria, which resolved. She had associated nausea, but no vomiting. She denies any fevers. Patient reports history of kidney stones, and she states that her symptoms feel similar. Patient has taken ibuprofen at home with some relief. Patient denies any chest pain, shortness of breath. Of note, patient states she has had intermittent bleeding from an IUD that was placed April, however her GYN told her this was expected.  HPI  Past Medical History:  Diagnosis Date  . Chronic headache   . Depression   . Former smoker    quit in 14  . Hyperlipidemia   . Kidney stones   . Left bundle branch block   . Obesity   . Systolic dysfunction, left ventricle    EF is 43% per Myoview; dyssynchrony of the septum noted on echo; has LBBB    Patient Active Problem List   Diagnosis Date Noted  . Acute bronchitis 04/28/2014  . Diarrhea 11/18/2013  . Right flank pain 11/18/2013  . RLQ abdominal pain 11/18/2013  . Routine general medical examination at a health care facility 08/06/2013  . Palpitations 04/22/2013  . Chronic systolic congestive heart failure ([place]) 08/16/2011  . Left bundle branch block 05/08/2011  . Pleuritic chest pain 04/07/2011  . UTI (lower urinary tract infection) 03/15/2011  . Ureteral stone 03/15/2011  . Screening for HIV (human immunodeficiency virus) 09/16/2010  . WEIGHT LOSS-ABNORMAL 01/11/2009  . DEPRESSION 10/20/2008  . HYPERLIPIDEMIA 06/22/2007  . HEADACHE, CHRONIC 06/22/2007  . NEPHROLITHIASIS, HX OF 06/22/2007  . ECZEMA 03/25/2007    Past  Surgical History:  Procedure Laterality Date  . CHOLECYSTECTOMY  2009  . CYSTOSCOPY WITH RETROGRADE PYELOGRAM, URETEROSCOPY AND STENT PLACEMENT  03/06/2012   Procedure: CYSTOSCOPY WITH RETROGRADE PYELOGRAM, URETEROSCOPY AND STENT PLACEMENT;  Surgeon: Molli Hazard, MD;  Location: [facility];  Service: Urology;  Laterality: Left;  . CYSTOSCOPY WITH RETROGRADE PYELOGRAM, URETEROSCOPY AND STENT PLACEMENT Left 03/20/2012   Procedure: CYSTOSCOPY WITH RETROGRADE PYELOGRAM, URETEROSCOPY AND STENT PLACEMENT;  Surgeon: Molli Hazard, MD;  Location: [facility];  Service: Urology;  Laterality: Left;  balloon dilitation  . FOOT SURGERY  12/2005  . HOLMIUM LASER APPLICATION Left 1/76/1607   Procedure: HOLMIUM LASER APPLICATION;  Surgeon: Molli Hazard, MD;  Location: [facility];  Service: Urology;  Laterality: Left;  . TONSILLECTOMY  03/2010  . WRIST SURGERY  11/2001    OB History    No data available       Home Medications    Prior to Admission medications   Medication Sig Start Date End Date Taking? Authorizing Provider  cephALEXin (KEFLEX) 500 MG capsule Take 1 capsule (500 mg total) by mouth 4 (four) times daily. 08/20/16 08/30/16  Frederica Kuster, PA-C  ibuprofen (ADVIL,MOTRIN) 800 MG tablet Take 1 tablet (800 mg total) by mouth 3 (three) times daily. 08/20/16   Kajal Scalici, Bea Graff, PA-C  ondansetron (ZOFRAN) 4 MG tablet Take 1 tablet (4 mg total) by mouth every 6 (six) hours. 08/20/16   Eliezer Mccoy  M, PA-C    Family History Family History  Problem Relation Age of Onset  . Hyperlipidemia Mother   . Hyperlipidemia Brother   . Hyperlipidemia Brother   . Colon cancer Neg Hx     Social History Social History  Substance Use Topics  . Smoking status: Former Smoker    Quit date: 01/31/1991  . Smokeless tobacco: Never Used  . Alcohol use Yes     Comment: occ     Allergies   Codeine and Penicillins   Review of  Systems Review of Systems  Constitutional: Negative for chills and fever.  HENT: Negative for facial swelling and sore throat.   Respiratory: Negative for shortness of breath.   Cardiovascular: Negative for chest pain.  Gastrointestinal: Negative for abdominal pain, nausea and vomiting.  Genitourinary: Positive for dysuria, flank pain and urgency.  Musculoskeletal: Negative for back pain.  Skin: Negative for rash and wound.  Neurological: Negative for headaches.  Psychiatric/Behavioral: The patient is not nervous/anxious.      Physical Exam Updated Vital Signs BP (!) 142/84 (BP Location: Right Arm)   Pulse 82   Temp 98.5 F (36.9 C) (Oral)   Resp 16   Wt 99.8 kg (220 lb)   LMP 08/06/2016   SpO2 100%   BMI 31.57 kg/m   Physical Exam  Constitutional: She appears well-developed and well-nourished. No distress.  HENT:  Head: Normocephalic and atraumatic.  Mouth/Throat: Oropharynx is clear and moist. No oropharyngeal exudate.  Eyes: Pupils are equal, round, and reactive to light. Conjunctivae are normal. Right eye exhibits no discharge. Left eye exhibits no discharge. No scleral icterus.  Neck: Normal range of motion. Neck supple. No thyromegaly present.  Cardiovascular: Normal rate, regular rhythm, normal heart sounds and intact distal pulses.  Exam reveals no gallop and no friction rub.   No murmur heard. Pulmonary/Chest: Effort normal and breath sounds normal. No stridor. No respiratory distress. She has no wheezes. She has no rales.  Abdominal: Soft. Bowel sounds are normal. She exhibits no distension. There is tenderness. There is no rebound, no guarding and no CVA tenderness.    Musculoskeletal: She exhibits no edema.       Back:  Lymphadenopathy:    She has no cervical adenopathy.  Neurological: She is alert. Coordination normal.  Skin: Skin is warm and dry. No rash noted. She is not diaphoretic. No pallor.  Psychiatric: She has a normal mood and affect.  Nursing  note and vitals reviewed.    ED Treatments / Results  Labs (all labs ordered are listed, but only abnormal results are displayed) Labs Reviewed  URINALYSIS, ROUTINE W REFLEX MICROSCOPIC - Abnormal; Notable for the following:       Result Value   Hgb urine dipstick SMALL (*)    Leukocytes, UA SMALL (*)    All other components within normal limits  URINALYSIS, MICROSCOPIC (REFLEX) - Abnormal; Notable for the following:    Bacteria, UA RARE (*)    Squamous Epithelial / LPF 0-5 (*)    All other components within normal limits  URINE CULTURE  BASIC METABOLIC PANEL  CBC    EKG  EKG Interpretation None       Radiology Ct Renal Stone Study  Result Date: 08/20/2016 CLINICAL DATA:  Right flank pain, hematuria EXAM: CT ABDOMEN AND PELVIS WITHOUT CONTRAST TECHNIQUE: Multidetector CT imaging of the abdomen and pelvis was performed following the standard protocol without IV contrast. COMPARISON:  03/15/2011 FINDINGS: Lower chest: Lung bases are clear. Hepatobiliary:  Unenhanced liver is unremarkable. Status post cholecystectomy. No intrahepatic or extrahepatic ductal dilatation. Pancreas: Within normal limits. Spleen: Within normal limits. Adrenals/Urinary Tract: Adrenal glands are within normal limits. 4 mm nonobstructing right lower pole renal calculus (series 2/ image 33). 5 mm nonobstructing interpolar left renal calculus (series 2/ image 25). No ureteral or bladder calculi.  No hydronephrosis. Bladder is within normal limits. Stomach/Bowel: Stomach is within normal limits. No evidence of bowel obstruction. Normal appendix (series 2/ image 52). Vascular/Lymphatic: No evidence of abdominal aortic aneurysm. No suspicious abdominopelvic lymphadenopathy. Reproductive: Uterus is notable for IUD in satisfactory position. Bilateral ovaries are within normal limits. Other: No abdominopelvic ascites. Musculoskeletal: Mild degenerative changes at T12-L1. IMPRESSION: Bilateral nonobstructing renal calculi  measuring up to 5 mm. No ureteral or bladder calculi. No hydronephrosis. IUD in satisfactory position. No evidence of bowel obstruction.  Normal appendix. Electronically Signed   By: Julian Hy M.D.   On: 08/20/2016 15:29    Procedures Procedures (including critical care time)  Medications Ordered in ED Medications  cephALEXin (KEFLEX) capsule 500 mg (not administered)  ondansetron (ZOFRAN) injection 4 mg (4 mg Intravenous Given 08/20/16 1442)  ketorolac (TORADOL) 30 MG/ML injection 30 mg (30 mg Intravenous Given 08/20/16 1546)     Initial Impression / Assessment and Plan / ED Course  I have reviewed the triage vital signs and the nursing notes.  Pertinent labs & imaging results that were available during my care of the patient were reviewed by me and considered in my medical decision making (see chart for details).     Patient with suspected urinary tract infection. CBC, BMP unremarkable. CT renal stone study shows bilateral nonobstructing stones, however no ureteral or bladder calculi or hydronephrosis; normal appendix, no evidence of bowel obstruction, IUD in satisfactory position. We'll treat urinary tract infection for early pyelonephritis. Discharged with Keflex, Zofran, ibuprofen. Patient symptoms resolved with IV Toradol in the ED. Follow-up to PCP if symptoms are not improving. Return precautions discussed. Patient understands and agrees with plan. Patient vitals stable throughout ED course and discharged in satisfactory condition.  Final Clinical Impressions(s) / ED Diagnoses   Final diagnoses:  Lower urinary tract infectious disease  Nephrolithiasis    New Prescriptions New Prescriptions   CEPHALEXIN (KEFLEX) 500 MG CAPSULE    Take 1 capsule (500 mg total) by mouth 4 (four) times daily.   IBUPROFEN (ADVIL,MOTRIN) 800 MG TABLET    Take 1 tablet (800 mg total) by mouth 3 (three) times daily.   ONDANSETRON (ZOFRAN) 4 MG TABLET    Take 1 tablet (4 mg total) by mouth  every 6 (six) hours.     Frederica Kuster, PA-C 08/20/16 1636    Little, Wenda Overland, MD 08/22/16 0700

## 2016-08-20 NOTE — ED Triage Notes (Signed)
Patient reports that she is having intermittent right side abdominal pain and flank pain, has a history of kidenystone. The patient reports that she is also having nausea at times. She states that she took 1 hydrocodone prior to coming. The patient reports that she is bleeding from an IUD that was placed in April as well. The patient is in no noted distress in triage

## 2016-08-20 NOTE — Discharge Instructions (Signed)
Medications: Keflex, ibuprofen, Zofran  Treatment: Take Keflex 4 times daily for 10 days. Make sure to finish all this medication. A urine culture will be sent and you will be called if there needs to be a change to your antibiotic. Take ibuprofen every 8 hours as needed for your pain. You can alternate with Tylenol as prescribed over-the-counter. Take Zofran every 6 hours as needed for nausea or vomiting.  Follow-up: Please see her doctor in 2-3 days of your symptoms are not improving. Please return to the emergency department if you develop any new or worsening symptoms.

## 2016-08-20 NOTE — ED Notes (Signed)
Pt stating she is nauseated.

## 2016-08-23 LAB — URINE CULTURE
Culture: >=100000 — AB
Special Requests: NORMAL

## 2016-08-24 ENCOUNTER — Telehealth: Payer: Self-pay | Admitting: Emergency Medicine

## 2016-08-24 NOTE — Telephone Encounter (Signed)
Post ED Visit - Positive Culture Follow-up  Culture report reviewed by antimicrobial stewardship pharmacist:  []  Elenor Quinones, Pharm.D. []  Heide Guile, Pharm.D., BCPS AQ-ID [x]  Parks Neptune, Pharm.D., BCPS []  Alycia Rossetti, Pharm.D., BCPS []  Adrian, [place].D., BCPS, AAHIVP []  Legrand Como, Pharm.D., BCPS, AAHIVP []  Salome Arnt, PharmD, BCPS []  Dimitri Ped, PharmD, BCPS []  Vincenza Hews, PharmD, BCPS  Positive urine culture Treated with cephalexin, organism sensitive to the same and no further patient follow-up is required at this time.  Hazle Nordmann 08/24/2016, 1:18 PM

## 2016-10-27 ENCOUNTER — Encounter: Payer: Self-pay | Admitting: Gastroenterology

## 2016-10-27 ENCOUNTER — Ambulatory Visit (INDEPENDENT_AMBULATORY_CARE_PROVIDER_SITE_OTHER): Payer: BLUE CROSS/BLUE SHIELD | Admitting: Gastroenterology

## 2016-10-27 VITALS — BP 132/88 | HR 76 | Ht 70.0 in | Wt 225.0 lb

## 2016-10-27 DIAGNOSIS — R195 Other fecal abnormalities: Secondary | ICD-10-CM

## 2016-10-27 DIAGNOSIS — G8929 Other chronic pain: Secondary | ICD-10-CM

## 2016-10-27 DIAGNOSIS — R1013 Epigastric pain: Secondary | ICD-10-CM

## 2016-10-27 DIAGNOSIS — K219 Gastro-esophageal reflux disease without esophagitis: Secondary | ICD-10-CM | POA: Diagnosis not present

## 2016-10-27 DIAGNOSIS — R1031 Right lower quadrant pain: Secondary | ICD-10-CM | POA: Diagnosis not present

## 2016-10-27 MED ORDER — NA SULFATE-K SULFATE-MG SULF 17.5-3.13-1.6 GM/177ML PO SOLN: 1.0000 | Freq: Once | ORAL | 0 refills | Status: AC

## 2016-10-27 NOTE — Patient Instructions (Addendum)
Imodium one pill every morning.  Ranitidine 150mg  pill, one pill every night at bedtime.  You will be set up for a colonoscopy for loose stools, RLQ pains.  You will be set up for an upper endoscopy for epigastric pain.  Normal BMI (Body Mass Index- based on height and weight) is between 19 and 25. Your BMI today is Body mass index is 32.28 kg/m. Marland Kitchen Please consider follow up  regarding your BMI with your Primary Care Provider.

## 2016-10-27 NOTE — Progress Notes (Signed)
Upper endoscopy October 2007, Dr. Ardis Hughs, done for abdominal pain.  The examination was normal Colonoscopy October 2007, Dr. Ardis Hughs, done for rectal bleeding, abdominal pain, bloating, diarrhea. Findings normal terminal ileum. Colonic because it was normal throughout. Biopsies were done randomly to check for microscopic colitis and they were all normal.  Laparoscopic cholecystectomy June 2008 for gallstones, chronic cholecystitis  HPI: This is a very pleasant 46 year old whom I last saw 45 years ago. She was here in our office and saw Cecille Rubin little less than 45 years ago.  Chief complaint is chronic right lower quadrant pain, chronic loose stools, new epigastric discomfort, chronic perianal, rectal discomfort  Many issues: Eats and moves her bowels within 20 min.  Loose only, Very rarely solid stools. Has 4-5 loose stools daily. Often with urgency and occasionally with incontinence.  She has pain in anus for 5-6 years. Used to occur only once a year. No is occurring twice per mo  Will last 20 minutes.  This can be very severe, spasm-like pains.  She thinks there is a hemorrhoid, she's seen it with a meter.  She sees blood occasionally.  Still has pains in RLQ pains. This is chronic  Having some sharp, burning epigastric pains. Has tried ranitidine once in a while and has not noticed much improvement.  This is intermittent. Worse with beer intake or fizzy drinks.  ROS: complete GI ROS as described in HPI, all other review negative.  Constitutional:  No unintentional weight loss  Weight is up 10 pounds in  3 years (same scale here in GI office).   Past Medical History:  Diagnosis Date  . Chronic headache   . Depression   . Former smoker    quit in 60  . Hyperlipidemia   . Kidney stones   . Left bundle branch block   . Obesity   . Systolic dysfunction, left ventricle    EF is 43% per Myoview; dyssynchrony of the septum noted on echo; has LBBB    Past Surgical History:   Procedure Laterality Date  . CHOLECYSTECTOMY  2009  . CYSTOSCOPY WITH RETROGRADE PYELOGRAM, URETEROSCOPY AND STENT PLACEMENT  03/06/2012   Procedure: CYSTOSCOPY WITH RETROGRADE PYELOGRAM, URETEROSCOPY AND STENT PLACEMENT;  Surgeon: Molli Hazard, MD;  Location: [facility];  Service: Urology;  Laterality: Left;  . CYSTOSCOPY WITH RETROGRADE PYELOGRAM, URETEROSCOPY AND STENT PLACEMENT Left 03/20/2012   Procedure: CYSTOSCOPY WITH RETROGRADE PYELOGRAM, URETEROSCOPY AND STENT PLACEMENT;  Surgeon: Molli Hazard, MD;  Location: [facility];  Service: Urology;  Laterality: Left;  balloon dilitation  . FOOT SURGERY  12/2005  . HOLMIUM LASER APPLICATION Left 4/00/8676   Procedure: HOLMIUM LASER APPLICATION;  Surgeon: Molli Hazard, MD;  Location: [facility];  Service: Urology;  Laterality: Left;  . TONSILLECTOMY  03/2010  . WRIST SURGERY  11/2001    Current Outpatient Prescriptions  Medication Sig Dispense Refill  . Ascorbic Acid (VITAMIN C) 250 MG CHEW Chew by mouth.    Marland Kitchen ibuprofen (ADVIL,MOTRIN) 800 MG tablet Take 1 tablet (800 mg total) by mouth 3 (three) times daily. 21 tablet 0  . Multiple Vitamins-Minerals (MULTIVITAMIN GUMMIES ADULT PO) Take by mouth.     No current facility-administered medications for this visit.     Allergies as of 10/27/2016 - Review Complete 10/27/2016  Allergen Reaction Noted  . Codeine Nausea And Vomiting 03/25/2007  . Penicillins  03/25/2007    Family History  Problem Relation Age of Onset  .  Hyperlipidemia Mother   . Hyperlipidemia Brother   . Hyperlipidemia Brother   . Colon cancer Neg Hx     Social History   Social History  . Marital status: Divorced    Spouse name: N/A  . Number of children: N/A  . Years of education: N/A   Occupational History  . [employer]    Social History Main Topics  . Smoking status: Former Smoker    Quit date: 01/31/1991  . Smokeless tobacco: Never  Used  . Alcohol use Yes     Comment: occ  . Drug use: No  . Sexual activity: Not on file   Other Topics Concern  . Not on file   Social History Narrative   Daily caffeine use:  Sweet Tea daily      Patient does not get regular exercise     Physical Exam: BP 132/88   Pulse 76   Ht _0  (1.778 m)   Wt 225 lb (102.1 kg)   BMI 32.28 kg/m  Constitutional: generally well-appearing Psychiatric: alert and oriented x3 Abdomen: soft, nontender, nondistended, no obvious ascites, no peritoneal signs, normal bowel sounds No peripheral edema noted in lower extremities Rectal exam was deferred for upcoming colonoscopy  Assessment and plan: 46 y.o. female with chronic loose stools, chronic intermittent anal pain, chronic right lower quadrant pain, new intermittent epigastric pain  First she has myriad upper and lower GI symptoms. For her chronic loose stools and right lower quadrant pain and recommended colonoscopy. I also recommended she start Imodium on a scheduled basis every morning. This might be colorrhea diarrhea since her gallbladder was removed many years ago. She had tried cholestyramine but did not like the taste of the powder. I think it did help a bit. If Imodium does not help we might have to retry those colorrhea agents. She has some intermittent epigastric pains that are GERD-like. I recommended she start taking a single H2 blocker at bedtime every night. I also recommended that we proceed with upper endoscopy at same time as the colonoscopy. Lastly she has intermittent anal, distal rectal pain. This is been chronic. She knows she has had a fissure in the past. We decided to defer rectal exam for the time of her upcoming colonoscopy, she much preferred that to be the case. I think she might have chronic minor fissure or perhaps symptomatic hemorrhoids.  Please see the "Patient Instructions" section for addition details about the plan.  Owens Loffler, MD [facility] 10/27/2016, 9:15 AM

## 2016-11-14 DIAGNOSIS — Z23 Encounter for immunization: Secondary | ICD-10-CM | POA: Diagnosis not present

## 2016-11-21 ENCOUNTER — Ambulatory Visit: Payer: BLUE CROSS/BLUE SHIELD | Admitting: Cardiovascular Disease

## 2016-12-11 ENCOUNTER — Encounter: Payer: Self-pay | Admitting: Gastroenterology

## 2016-12-15 ENCOUNTER — Encounter: Payer: Self-pay | Admitting: Gastroenterology

## 2016-12-15 ENCOUNTER — Ambulatory Visit (AMBULATORY_SURGERY_CENTER): Payer: BLUE CROSS/BLUE SHIELD | Admitting: Gastroenterology

## 2016-12-15 VITALS — BP 143/92 | HR 76 | Temp 98.4°F | Resp 19 | Ht 70.0 in | Wt 225.0 lb

## 2016-12-15 DIAGNOSIS — R1013 Epigastric pain: Secondary | ICD-10-CM | POA: Diagnosis present

## 2016-12-15 DIAGNOSIS — R195 Other fecal abnormalities: Secondary | ICD-10-CM

## 2016-12-15 DIAGNOSIS — K2970 Gastritis, unspecified, without bleeding: Secondary | ICD-10-CM | POA: Diagnosis not present

## 2016-12-15 DIAGNOSIS — K2990 Gastroduodenitis, unspecified, without bleeding: Secondary | ICD-10-CM

## 2016-12-15 DIAGNOSIS — D125 Benign neoplasm of sigmoid colon: Secondary | ICD-10-CM

## 2016-12-15 MED ORDER — SODIUM CHLORIDE 0.9 % IV SOLN: 500.0000 mL | INTRAVENOUS | Status: DC

## 2016-12-15 NOTE — Progress Notes (Signed)
To PACU, VSS. Report to [email or web address] 

## 2016-12-15 NOTE — Op Note (Signed)
[facility] Patient Name: Kendra [name] Procedure Date: 12/15/2016 1:37 PM MRN: [id number] Endoscopist: Milus Banister , MD Age: 46 Referring MD:  Date of Birth: [birth year] Gender: Female Account #: [id number] Procedure:                Colonoscopy Indications:              Chronic loose stools, right sided abdominal pain Medicines:                Monitored Anesthesia Care Procedure:                Pre-Anesthesia Assessment:                           - Prior to the procedure, a History and Physical                            was performed, and patient medications and                            allergies were reviewed. The patient's tolerance of                            previous anesthesia was also reviewed. The risks                            and benefits of the procedure and the sedation                            options and risks were discussed with the patient.                            All questions were answered, and informed consent                            was obtained. Prior Anticoagulants: The patient has                            taken no previous anticoagulant or antiplatelet                            agents. ASA Grade Assessment: II - A patient with                            mild systemic disease. After reviewing the risks                            and benefits, the patient was deemed in                            satisfactory condition to undergo the procedure.                           After obtaining informed consent, the colonoscope  was passed under direct vision. Throughout the                            procedure, the patient's blood pressure, pulse, and                            oxygen saturations were monitored continuously. The                            Colonoscope was introduced through the anus and                            advanced to the the terminal ileum. The colonoscopy                            was  performed without difficulty. The patient                            tolerated the procedure well. The quality of the                            bowel preparation was excellent. The terminal                            ileum, ileocecal valve, appendiceal orifice, and                            rectum were photographed. Scope In: 1:46:17 PM Scope Out: 1:56:21 PM Scope Withdrawal Time: 0 hours 8 minutes 6 seconds  Total Procedure Duration: 0 hours 10 minutes 4 seconds  Findings:                 The terminal ileum appeared normal.                           A 3 mm polyp was found in the sigmoid colon. The                            polyp was sessile. The polyp was removed with a                            cold snare. Resection and retrieval were complete.                           Small external hemorrhoids.                           The exam was otherwise without abnormality on                            direct and retroflexion views.                           Biopsies for histology were taken with a cold  forceps from the entire colon for evaluation of                            microscopic colitis. Complications:            No immediate complications. Estimated blood loss:                            None. Estimated Blood Loss:     Estimated blood loss: none. Impression:               - The examined portion of the ileum was normal.                           - One 3 mm polyp in the sigmoid colon, removed with                            a cold snare. Resected and retrieved.                           - Small external hemorrhoids.                           - The examination was otherwise normal on direct                            and retroflexion views.                           - Biopsies were taken with a cold forceps from the                            entire colon for evaluation of microscopic colitis. Recommendation:           - Patient has a contact number  available for                            emergencies. The signs and symptoms of potential                            delayed complications were discussed with the                            patient. Return to normal activities tomorrow.                            Written discharge instructions were provided to the                            patient.                           - Resume previous diet.                           - Continue present medications.                           -  Await pathology results. If no sign of                            microscopic colitis, will likely recommend another                            trial of cholestyramine (pill vs. powder).                           You will receive a letter within 2-3 weeks with the                            pathology results and my final recommendations.                           If the polyp(s) is proven to be 'pre-cancerous' on                            pathology, you will need repeat colonoscopy in 5                            years. If the polyp(s) is NOT 'precancerous' on                            pathology then you should repeat colon cancer                            screening in 10 years with colonoscopy without need                            for colon cancer screening by any method prior to                            then (including stool testing). Milus Banister, MD 12/15/2016 5:46:50 PM This report has been signed electronically.

## 2016-12-15 NOTE — Progress Notes (Signed)
Called to room to assist during endoscopic procedure.  Patient ID and intended procedure confirmed with present staff. Received instructions for my participation in the procedure from the performing physician.Called to room to assist during endoscopic procedure.  Patient ID and intended procedure confirmed with present staff. Received instructions for my participation in the procedure from the performing physician. 

## 2016-12-15 NOTE — Patient Instructions (Signed)
Impression/Recommendations:  Gastritis handout given to patient. Polyp handout given to patient. Hemorrhoid handout given to patient.  Resume previous diet.  Stop Ranitidine and start once daily Over The Counter Omeprazole - 20 mg. Pill, one pill 20-30 minutes prior to breakfast meal only.  Await biopsy results.  Repeat colonoscopy recommended.   Date to be determined after pathology results reviewed.  YOU HAD AN ENDOSCOPIC PROCEDURE TODAY AT [facility]:   Refer to the procedure report that was given to you for any specific questions about what was found during the examination.  If the procedure report does not answer your questions, please call your gastroenterologist to clarify.  If you requested that your care partner not be given the details of your procedure findings, then the procedure report has been included in a sealed envelope for you to review at your convenience later.  YOU SHOULD EXPECT: Some feelings of bloating in the abdomen. Passage of more gas than usual.  Walking can help get rid of the air that was put into your GI tract during the procedure and reduce the bloating. If you had a lower endoscopy (such as a colonoscopy or flexible sigmoidoscopy) you may notice spotting of blood in your stool or on the toilet paper. If you underwent a bowel prep for your procedure, you may not have a normal bowel movement for a few days.  Please Note:  You might notice some irritation and congestion in your nose or some drainage.  This is from the oxygen used during your procedure.  There is no need for concern and it should clear up in a day or so.  SYMPTOMS TO REPORT IMMEDIATELY:   Following lower endoscopy (colonoscopy or flexible sigmoidoscopy):  Excessive amounts of blood in the stool  Significant tenderness or worsening of abdominal pains  Swelling of the abdomen that is new, acute  Fever of 100F or higher   Following upper endoscopy (EGD)  Vomiting of blood or  coffee ground material  New chest pain or pain under the shoulder blades  Painful or persistently difficult swallowing  New shortness of breath  Fever of 100F or higher  Black, tarry-looking stools  For urgent or emergent issues, a gastroenterologist can be reached at any hour by calling [phone].   DIET:  We do recommend a small meal at first, but then you may proceed to your regular diet.  Drink plenty of fluids but you should avoid alcoholic beverages for 24 hours.  ACTIVITY:  You should plan to take it easy for the rest of today and you should NOT DRIVE or use heavy machinery until tomorrow (because of the sedation medicines used during the test).    FOLLOW UP: Our staff will call the number listed on your records the next business day following your procedure to check on you and address any questions or concerns that you may have regarding the information given to you following your procedure. If we do not reach you, we will leave a message.  However, if you are feeling well and you are not experiencing any problems, there is no need to return our call.  We will assume that you have returned to your regular daily activities without incident.  If any biopsies were taken you will be contacted by phone or by letter within the next 1-3 weeks.  Please call us at [phone] if you have not heard about the biopsies in 3 weeks.    SIGNATURES/CONFIDENTIALITY: You and/or your care  partner have signed paperwork which will be entered into your electronic medical record.  These signatures attest to the fact that that the information above on your After Visit Summary has been reviewed and is understood.  Full responsibility of the confidentiality of this discharge information lies with you and/or your care-partner.

## 2016-12-15 NOTE — Op Note (Signed)
[facility] Patient Name: Kendra [name] Procedure Date: 12/15/2016 1:37 PM MRN: [id number] Endoscopist: Milus Banister , MD Age: 46 Referring MD:  Date of Birth: 1970-08-20 Gender: Female Account #: [id number] Procedure:                Upper GI endoscopy Indications:              Epigastric abdominal pain Medicines:                Monitored Anesthesia Care Procedure:                Pre-Anesthesia Assessment:                           - Prior to the procedure, a History and Physical                            was performed, and patient medications and                            allergies were reviewed. The patient's tolerance of                            previous anesthesia was also reviewed. The risks                            and benefits of the procedure and the sedation                            options and risks were discussed with the patient.                            All questions were answered, and informed consent                            was obtained. Prior Anticoagulants: The patient has                            taken no previous anticoagulant or antiplatelet                            agents. ASA Grade Assessment: II - A patient with                            mild systemic disease. After reviewing the risks                            and benefits, the patient was deemed in                            satisfactory condition to undergo the procedure.                           After obtaining informed consent, the endoscope was  passed under direct vision. Throughout the                            procedure, the patient's blood pressure, pulse, and                            oxygen saturations were monitored continuously. The                            Model GIF-HQ190 [id number]) scope was introduced                            through the mouth, and advanced to the second part                            of duodenum. The upper  GI endoscopy was                            accomplished without difficulty. The patient                            tolerated the procedure well. Scope In: Scope Out: Findings:                 There was mild, reflux related esophagitis (two                            small linear erosions).                           Mild inflammation characterized by erythema and                            friability was found in the gastric antrum.                            Biopsies were taken with a cold forceps for                            histology.                           The exam was otherwise without abnormality. Complications:            No immediate complications. Estimated blood loss:                            None. Estimated Blood Loss:     Estimated blood loss: none. Impression:               - Mild acid related esophagitis.                           - Mild gastritis, biopsied to check for H. pylori.                           - The examination  was otherwise normal. Recommendation:           - Patient has a contact number available for                            emergencies. The signs and symptoms of potential                            delayed complications were discussed with the                            patient. Return to normal activities tomorrow.                            Written discharge instructions were provided to the                            patient.                           - Resume previous diet.                           - Stop your ranitidine and instead start once daily                            OTC omeprazole (20mg  pill, one pill 20-30 min prior                            to breakfast meal daily).                           - Await pathology results. If biopsies show H.                            pylori, you will be started on appropriate                            antibiotics. Milus Banister, MD 12/15/2016 2:10:01 PM This report has been signed  electronically.

## 2016-12-18 ENCOUNTER — Telehealth: Payer: Self-pay | Admitting: *Deleted

## 2016-12-18 NOTE — Telephone Encounter (Signed)
  Follow up Call-  Call back number 12/15/2016  Post procedure Call Back phone  # [phone]  Permission to leave phone message Yes  Some recent data might be hidden     Patient questions:  Do you have a fever, pain , or abdominal swelling? No. Pain Score  0 *  Have you tolerated food without any problems? Yes.    Have you been able to return to your normal activities? Yes.    Do you have any questions about your discharge instructions: Diet   No. Medications  No. Follow up visit  No.  Do you have questions or concerns about your Care? No.  Actions: * If pain score is 4 or above: No action needed, pain <4.

## 2016-12-18 NOTE — Telephone Encounter (Signed)
error 

## 2016-12-27 ENCOUNTER — Telehealth: Payer: Self-pay | Admitting: Gastroenterology

## 2016-12-27 NOTE — Telephone Encounter (Signed)
Dr Ardis Hughs have you reviewed the pathology for this patient?

## 2016-12-28 ENCOUNTER — Other Ambulatory Visit: Payer: Self-pay

## 2016-12-28 MED ORDER — COLESTIPOL HCL 1 G PO TABS: 1.0000 g | ORAL_TABLET | Freq: Two times a day (BID) | ORAL | 5 refills | Status: DC

## 2016-12-28 NOTE — Telephone Encounter (Signed)
See results note from today 

## 2017-02-06 ENCOUNTER — Encounter: Payer: Self-pay | Admitting: Cardiovascular Disease

## 2017-02-06 ENCOUNTER — Ambulatory Visit (INDEPENDENT_AMBULATORY_CARE_PROVIDER_SITE_OTHER): Payer: BLUE CROSS/BLUE SHIELD | Admitting: Cardiovascular Disease

## 2017-02-06 VITALS — BP 138/88 | HR 76 | Ht 70.0 in | Wt 223.0 lb

## 2017-02-06 DIAGNOSIS — I447 Left bundle-branch block, unspecified: Secondary | ICD-10-CM | POA: Diagnosis not present

## 2017-02-06 DIAGNOSIS — I5022 Chronic systolic (congestive) heart failure: Secondary | ICD-10-CM

## 2017-02-06 NOTE — Progress Notes (Signed)
Kendra [name] Date of Birth  1970-06-05 [place] [address]    [place]   [employer], [place]  [address]    [place], [place]  [address] [phone]  Fax  [phone]  [phone]  Fax [phone]  Problem List: 1. Left bundle branch block 2. Chest pain 3. Kidney stone 4. Palpitations  5. Mild chronic systolic CHF - in the setting of LBBB.  EF 43% by myoview in April  2013.    History of Present Illness:  Summar  is a 47 y.o. female. She presented to her medical Dr. with some upper left-sided abdominal pain/breast pain.  This pain is occasionally pleuritic. She was thought to have a pulmonary illness. A d-dimer was subsequent on and found to be negative the An EKG showed left bundle branch block.   A subsequent echocardiogram revealed dyssynchrony of her septum.  She does not exercise on regular basis. She walks occasionally. She's been in the process of moving. She has noticed that she's a little bit more short of breath moving boxes. She's also noticed some left arm tingling and numbness which radiates down to her fingers.  We performed a Lexiscan Myoview which revealed an EF of 43%.  Overall Impression: Low risk stress nuclear study. No ischemia or infarct. Baseline ECG LBBB with decreased EF  LV Ejection Fraction: 43%. LV Wall Motion: Apical hypokinesis  We initially had her on Toprol-XL but she did not tolerate it. She felt very lethargic. We now have her on carvedilol which he seems to tolerate much better.  She's exercising on occasion.  She has been measuring her BP on occasion.  Her readings have been ok but several have been elevated.  Feb. 27, 2014:  She has had several kidney surgeries since I last saw her.  She had some chest pressure when she woke up for anesthesia. She has had some bad indigestion since then.  Better with antiacids.  She has not been exercising much for the past month.  Her BP has been  well controlled.    Nov. 10, 2014:  She is doing well. Having some PVCs on occasion.  She has been walking some.  She has had occaisonal ankle swelling.    No edema today.  She has a new job.   April 22, 2013:    Kendra [name]  presents today for further evaluation of palpitations. She's been under a little bit more stress than usual. She's also been fatigued.   We placed a Holter monitor. She had sinus tachycardia as well as sinus bradycardia. She has an underlying bundle branch block. There were no arrhythmias to suggest SVT   July 07, 2013:  Kendra [name] is doing well.  Able to do all of her normal activities without problems. Trying to exercise.  She has not had many palpitations.  Sleeping better.    Wants to delay getting the sleep study.  July 28, 2014:  Doing great . Not many palpitations  Exercising  - walking several times a week .   Doing great.  Has lost 8 lbs.   Sept. 1, 2017:  Doing well No CP , no dyspnea Walking ,  Has lost 9 lbs since her last visit .   February 06, 2017:  Feeling well. Has gained some weight.    Works in [employer]  ( [employer])   No CP   Not avoiding salt .  Current Outpatient Medications on File Prior to Visit  Medication Sig Dispense Refill  . Ascorbic Acid (VITAMIN C) 250 MG CHEW Chew by mouth.    Marland Kitchen ibuprofen (ADVIL,MOTRIN) 800 MG tablet Take 1 tablet (800 mg total) by mouth 3 (three) times daily. 21 tablet 0   No current facility-administered medications on file prior to visit.     Allergies  Allergen Reactions  . Codeine Nausea And Vomiting  . Penicillins     REACTION: hives    Past Medical History:  Diagnosis Date  . Allergy   . Chronic headache   . Depression   . Former smoker    quit in 67  . Hyperlipidemia   . Kidney stones   . Left bundle branch block   . Obesity   . Systolic dysfunction, left ventricle    EF is 43% per Myoview; dyssynchrony of the septum noted on echo; has LBBB    Past Surgical History:  Procedure  Laterality Date  . CHOLECYSTECTOMY  2009  . CYSTOSCOPY WITH RETROGRADE PYELOGRAM, URETEROSCOPY AND STENT PLACEMENT  03/06/2012   Procedure: CYSTOSCOPY WITH RETROGRADE PYELOGRAM, URETEROSCOPY AND STENT PLACEMENT;  Surgeon: Molli Hazard, MD;  Location: [facility];  Service: Urology;  Laterality: Left;  . CYSTOSCOPY WITH RETROGRADE PYELOGRAM, URETEROSCOPY AND STENT PLACEMENT Left 03/20/2012   Procedure: CYSTOSCOPY WITH RETROGRADE PYELOGRAM, URETEROSCOPY AND STENT PLACEMENT;  Surgeon: Molli Hazard, MD;  Location: [facility];  Service: Urology;  Laterality: Left;  balloon dilitation  . FOOT SURGERY  12/2005  . HOLMIUM LASER APPLICATION Left 05/09/7351   Procedure: HOLMIUM LASER APPLICATION;  Surgeon: Molli Hazard, MD;  Location: [facility];  Service: Urology;  Laterality: Left;  . TONSILLECTOMY  03/2010  . WRIST SURGERY  11/2001    Social History   Tobacco Use  Smoking Status Former Smoker  . Last attempt to quit: 01/31/1991  . Years since quitting: 26.0  Smokeless Tobacco Never Used    Social History   Substance and Sexual Activity  Alcohol Use Yes   Comment: occ    Family History  Problem Relation Age of Onset  . Hyperlipidemia Mother   . Hyperlipidemia Brother   . Hyperlipidemia Brother   . Colon cancer Neg Hx     Reviw of Systems:  Reviewed in the HPI.  All other systems are negative.  Physical Exam: Blood pressure 138/88, pulse 76, height 5\' 10"  (1.778 m), weight 223 lb (101.2 kg).  GEN:  Well nourished, well developed in no acute distress, pleasant female  HEENT: Normal NECK: No JVD; No carotid bruits LYMPHATICS: No lymphadenopathy CARDIAC: RR, no murmur  RESPIRATORY:  Clear to auscultation without rales, wheezing or rhonchi  ABDOMEN: Soft, non-tender, non-distended MUSCULOSKELETAL:  No edema; No deformity  SKIN: Warm and dry NEUROLOGIC:  Alert and oriented x 3    ECG: Jan.  8, 2019: Normal  sinus rhythm at 71.  Left bundle branch block.   Assessment / Plan:   1. Left bundle branch block - stable .  Has had some mild systolic CHF. At this point she remains completely asymptomatic.  2. Mild chronic systolic CHF - in the setting of LBBB.  EF 43% by myoview in April  2013. She was on carvedilol but it made her feel fatigued.  The echo showed EF of 50-55%. We will repeat the echocardiogram.  I suspect that her ejection fraction is now normalized. I have encouraged her to work on weight loss program  and to avoid eating excessive salt.   3. Kidney stone 4. Palpitations  5. Chest pain   Mertie Moores, MD  02/06/2017 10:04 AM    [place],  [place],   [address] Pager [phone] Phone: [phone]; Fax: [phone]

## 2017-02-06 NOTE — Patient Instructions (Signed)
Medication Instructions:  Your physician recommends that you continue on your current medications as directed. Please refer to the Current Medication list given to you today.   Labwork: None Ordered   Testing/Procedures: Your physician has requested that you have an echocardiogram. Echocardiography is a painless test that uses sound waves to create images of your heart. It provides your doctor with information about the size and shape of your heart and how well your heart's chambers and valves are working. This procedure takes approximately one hour. There are no restrictions for this procedure.   Follow-Up: Your physician wants you to follow-up in: 1 year with Dr. Nahser. You will receive a reminder letter in the mail two months in advance. If you don't receive a letter, please call our office to schedule the follow-up appointment.   If you need a refill on your cardiac medications before your next appointment, please call your pharmacy.   Thank you for choosing [facility]! Amaya Blakeman, RN [phone]    

## 2017-02-19 ENCOUNTER — Ambulatory Visit (HOSPITAL_COMMUNITY): Payer: BLUE CROSS/BLUE SHIELD | Attending: Internal Medicine

## 2017-02-19 DIAGNOSIS — E785 Hyperlipidemia, unspecified: Secondary | ICD-10-CM | POA: Diagnosis not present

## 2017-02-19 DIAGNOSIS — I5022 Chronic systolic (congestive) heart failure: Secondary | ICD-10-CM

## 2017-02-19 DIAGNOSIS — I081 Rheumatic disorders of both mitral and tricuspid valves: Secondary | ICD-10-CM | POA: Diagnosis not present

## 2017-02-19 DIAGNOSIS — Z8249 Family history of ischemic heart disease and other diseases of the circulatory system: Secondary | ICD-10-CM | POA: Diagnosis not present

## 2017-02-19 DIAGNOSIS — I447 Left bundle-branch block, unspecified: Secondary | ICD-10-CM | POA: Diagnosis not present

## 2017-02-19 DIAGNOSIS — Z87891 Personal history of nicotine dependence: Secondary | ICD-10-CM | POA: Diagnosis not present

## 2017-02-21 ENCOUNTER — Telehealth: Payer: Self-pay | Admitting: Nurse Practitioner

## 2017-02-21 MED ORDER — SACUBITRIL-VALSARTAN 24-26 MG PO TABS: 1.0000 | ORAL_TABLET | Freq: Two times a day (BID) | ORAL | 1 refills | Status: DC

## 2017-02-21 NOTE — Telephone Encounter (Signed)
-----   Message from Thayer Headings, MD sent at 02/21/2017  2:17 PM EST ----- Correction: Lets start Entresto 24-26 mg PO BID. ( not Losartan )  Will see her back in several weeks and see if she can start coreg at that time  Will aggressively treat her with standard CHF meds.   If she does not improve on medical therapy, will refer to EP for Bi-V pacing

## 2017-02-21 NOTE — Telephone Encounter (Signed)
Reviewed results of echo and plan of care with patient. She verbalized agreement to start entresto 24-26 twice daily. I answered patient's questions to her satisfaction and scheduled her to see Dr. Acie Fredrickson on 2/5. She denies symptoms of SOB or swelling. I advised her to limit her salt intake and advised her to monitor for hypotension once she starts the entresto. She states she has a home BP monitor and will monitor regularly. I advised her to call back with questions or concerns and she thanked me for the call.

## 2017-02-22 ENCOUNTER — Telehealth: Payer: Self-pay | Admitting: Cardiovascular Disease

## 2017-02-22 NOTE — Telephone Encounter (Signed)
Pt c/o medication issue:  1. Name of Medication: Entresto   2. How are you currently taking this medication (dosage and times per day)? 24-26, 1 tablet 2x a day   3. Are you having a reaction (difficulty breathing--STAT)? no  4. What is your medication issue? Patient insurance will not cover med and patient was not able to have medication filled

## 2017-02-22 NOTE — Telephone Encounter (Signed)
Spoke with patient who states she was not able to pick up Entresto at her pharmacy last night. She states she offered to pay cash and was told she was not even able to do that because the insurance company was in a dispute with the drug company. I advised her that I will place samples at the front for her to pick up. She states her friend Letitia Libra, who is listed on her DPR, will likely pick up the medicine because she works out of town. She states she will call her insurance company today to determine the issue with the medication. She thanked me for the call.

## 2017-02-26 ENCOUNTER — Telehealth: Payer: Self-pay

## 2017-02-26 NOTE — Telephone Encounter (Signed)
**Note De-identified Emmanuel Ercole Obfuscation** I have done an Entresto PA through covermymeds. 

## 2017-03-06 ENCOUNTER — Ambulatory Visit (INDEPENDENT_AMBULATORY_CARE_PROVIDER_SITE_OTHER): Payer: BLUE CROSS/BLUE SHIELD | Admitting: Cardiovascular Disease

## 2017-03-06 ENCOUNTER — Encounter: Payer: Self-pay | Admitting: Cardiovascular Disease

## 2017-03-06 VITALS — BP 128/80 | HR 92 | Ht 70.0 in | Wt 224.2 lb

## 2017-03-06 DIAGNOSIS — I5022 Chronic systolic (congestive) heart failure: Secondary | ICD-10-CM

## 2017-03-06 DIAGNOSIS — Z13220 Encounter for screening for lipoid disorders: Secondary | ICD-10-CM

## 2017-03-06 DIAGNOSIS — I447 Left bundle-branch block, unspecified: Secondary | ICD-10-CM

## 2017-03-06 MED ORDER — CARVEDILOL 3.125 MG PO TABS: 3.1250 mg | ORAL_TABLET | Freq: Two times a day (BID) | ORAL | 11 refills | Status: DC

## 2017-03-06 NOTE — Progress Notes (Signed)
[name] Date of Birth  1970-09-04 [place] [address]    [place]   [place], [place]  [address]    [place], [place]  [address] [phone]  Fax  [phone]  [phone]  Fax [phone]  Problem List: 1. Left bundle branch block 2. Chest pain 3. Kidney stone 4. Palpitations  5. Mild chronic systolic CHF - in the setting of LBBB.  EF 43% by myoview in April  2013.    History of Present Illness:  Sanaya  is a 47 y.o. female. She presented to her medical Dr. with some upper left-sided abdominal pain/breast pain.  This pain is occasionally pleuritic. She was thought to have a pulmonary illness. A d-dimer was subsequent on and found to be negative the An EKG showed left bundle branch block.   A subsequent echocardiogram revealed dyssynchrony of her septum.  She does not exercise on regular basis. She walks occasionally. She's been in the process of moving. She has noticed that she's a little bit more short of breath moving boxes. She's also noticed some left arm tingling and numbness which radiates down to her fingers.  We performed a Lexiscan Myoview which revealed an EF of 43%.  Overall Impression: Low risk stress nuclear study. No ischemia or infarct. Baseline ECG LBBB with decreased EF  LV Ejection Fraction: 43%. LV Wall Motion: Apical hypokinesis  We initially had her on Toprol-XL but she did not tolerate it. She felt very lethargic. We now have her on carvedilol which he seems to tolerate much better.  She's exercising on occasion.  She has been measuring her BP on occasion.  Her readings have been ok but several have been elevated.  Feb. 27, 2014:  She has had several kidney surgeries since I last saw her.  She had some chest pressure when she woke up for anesthesia. She has had some bad indigestion since then.  Better with antiacids.  She has not been exercising much for the past month.  Her BP has been  well controlled.    Nov. 10, 2014:  She is doing well. Having some PVCs on occasion.  She has been walking some.  She has had occaisonal ankle swelling.    No edema today.  She has a new job.   April 22, 2013:    Canary  presents today for further evaluation of palpitations. She's been under a little bit more stress than usual. She's also been fatigued.   We placed a Holter monitor. She had sinus tachycardia as well as sinus bradycardia. She has an underlying bundle branch block. There were no arrhythmias to suggest SVT   July 07, 2013:  Shantice is doing well.  Able to do all of her normal activities without problems. Trying to exercise.  She has not had many palpitations.  Sleeping better.    Wants to delay getting the sleep study.  July 28, 2014:  Doing great . Not many palpitations  Exercising  - walking several times a week .   Doing great.  Has lost 8 lbs.   Sept. 1, 2017:  Doing well No CP , no dyspnea Walking ,  Has lost 9 lbs since her last visit .   February 06, 2017:  Feeling well. Has gained some weight.    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  Feb. 5, 2019:  Alvira is seen back for her chronic systolic congestive heart failure, left bundle branch block. Echocardiogram from January 21 reveals reduced left ventricular systolic function with an EF of 35%.   We added Entresto 24-26 mg twice a day. Has not been exercising    Current Outpatient Medications on File Prior to Visit  Medication Sig Dispense Refill  . Ascorbic Acid (VITAMIN C) 250 MG CHEW Chew by mouth.    Marland Kitchen ibuprofen (ADVIL,MOTRIN) 800 MG tablet Take 1 tablet (800 mg total) by mouth 3 (three) times daily. 21 tablet 0  . sacubitril-valsartan (ENTRESTO) 24-26 MG Take 1 tablet by mouth 2 (two) times daily. 60 tablet 1   No current facility-administered medications on file prior to visit.     Allergies  Allergen Reactions  . Codeine Nausea And Vomiting  . Penicillins      REACTION: hives    Past Medical History:  Diagnosis Date  . Allergy   . Chronic headache   . Depression   . Former smoker    quit in 22  . Hyperlipidemia   . Kidney stones   . Left bundle branch block   . Obesity   . Systolic dysfunction, left ventricle    EF is 43% per Myoview; dyssynchrony of the septum noted on echo; has LBBB    Past Surgical History:  Procedure Laterality Date  . CHOLECYSTECTOMY  2009  . CYSTOSCOPY WITH RETROGRADE PYELOGRAM, URETEROSCOPY AND STENT PLACEMENT  03/06/2012   Procedure: CYSTOSCOPY WITH RETROGRADE PYELOGRAM, URETEROSCOPY AND STENT PLACEMENT;  Surgeon: Molli Hazard, MD;  Location: [facility];  Service: Urology;  Laterality: Left;  . CYSTOSCOPY WITH RETROGRADE PYELOGRAM, URETEROSCOPY AND STENT PLACEMENT Left 03/20/2012   Procedure: CYSTOSCOPY WITH RETROGRADE PYELOGRAM, URETEROSCOPY AND STENT PLACEMENT;  Surgeon: Molli Hazard, MD;  Location: [facility];  Service: Urology;  Laterality: Left;  balloon dilitation  . FOOT SURGERY  12/2005  . HOLMIUM LASER APPLICATION Left 0/34/7425   Procedure: HOLMIUM LASER APPLICATION;  Surgeon: Molli Hazard, MD;  Location: [facility];  Service: Urology;  Laterality: Left;  . TONSILLECTOMY  03/2010  . WRIST SURGERY  11/2001    Social History   Tobacco Use  Smoking Status Former Smoker  . Last attempt to quit: 01/31/1991  . Years since quitting: 26.1  Smokeless Tobacco Never Used    Social History   Substance and Sexual Activity  Alcohol Use Yes   Comment: occ    Family History  Problem Relation Age of Onset  . Hyperlipidemia Mother   . Hyperlipidemia Brother   . Hyperlipidemia Brother   . Colon cancer Neg Hx     Reviw of Systems:  Reviewed in the HPI.  All other systems are negative.  Physical Exam: Blood pressure 128/80, pulse 92, height 5\' 10"  (1.778 m), weight 224 lb 3.2 oz (101.7 kg), SpO2 98 %.  GEN:  Mildly obese ,  pleasant female, no acute distress HEENT: Normal NECK: No JVD; No carotid bruits LYMPHATICS: No lymphadenopathy CARDIAC: RR, no murmurs  , gallops rubs  RESPIRATORY:  Clear to auscultation without rales, wheezing or rhonchi  ABDOMEN: Soft, non-tender, non-distended MUSCULOSKELETAL:  No edema; No deformity  SKIN: Warm and dry NEUROLOGIC:  Alert and oriented x 3     ECG: Jan.  8, 2019: Normal sinus rhythm at 71.  Left bundle branch block.   Assessment / Plan:   1. Left bundle branch block -  2. Mild chronic systolic CHF -Khara remains fairly asymptomatic but her ejection fraction is fallen to 35%.  We have added Entresto 24-26 mg twice a day.   She does not exercise so it is difficult to tell whether or not she would have symptoms if she were active.  We will add carvedilol 3.125 mg twice a day.  I will see her again in about 1 month.  We will continue to gradually titrate up her medications.  After we have reached maximal medical therapy, we will plan on getting an echocardiogram about 3 months after that point.  If her ejection fraction has not improved we will refer her to electrophysiology for consideration for biventricular pacemaker.  3. Kidney stone 4. Palpitations  5. Chest pain   Mertie Moores, MD  03/06/2017 4:53 PM    [facility] [place],  [place], [place]  [address] Pager [phone] Phone: [phone]; Fax: [phone]

## 2017-03-06 NOTE — Patient Instructions (Signed)
Medication Instructions:  Your physician has recommended you make the following change in your medication: START Carvedilol (Coreg) 3.125 mg twice daily   Labwork: Your physician recommends that you return for lab work on Thursday You will need to FAST for this appointment - nothing to eat or drink after midnight the night before except water.   Testing/Procedures: None Ordered   Follow-Up: Your physician recommends that you return for a follow-up appointment on Feb. 28   If you need a refill on your cardiac medications before your next appointment, please call your pharmacy.   Thank you for choosing [facility]! Christen Bame, RN [phone]

## 2017-03-08 ENCOUNTER — Other Ambulatory Visit: Payer: BLUE CROSS/BLUE SHIELD | Admitting: *Deleted

## 2017-03-08 DIAGNOSIS — I447 Left bundle-branch block, unspecified: Secondary | ICD-10-CM

## 2017-03-08 DIAGNOSIS — I5022 Chronic systolic (congestive) heart failure: Secondary | ICD-10-CM

## 2017-03-08 LAB — HEPATIC FUNCTION PANEL
ALK PHOS: 69 IU/L (ref 39–117)
ALT: 23 IU/L (ref 0–32)
AST: 21 IU/L (ref 0–40)
Albumin: 4 g/dL (ref 3.5–5.5)
Bilirubin Total: 0.5 mg/dL (ref 0.0–1.2)
Bilirubin, Direct: 0.14 mg/dL (ref 0.00–0.40)
Total Protein: 6.6 g/dL (ref 6.0–8.5)

## 2017-03-08 LAB — LIPID PANEL
CHOLESTEROL TOTAL: 208 mg/dL — AB (ref 100–199)
Chol/HDL Ratio: 3.6 ratio (ref 0.0–4.4)
HDL: 57 mg/dL (ref >39)
LDL Calculated: 135 mg/dL — ABNORMAL HIGH (ref 0–99)
TRIGLYCERIDES: 78 mg/dL (ref 0–149)
VLDL Cholesterol Cal: 16 mg/dL (ref 5–40)

## 2017-03-08 LAB — BASIC METABOLIC PANEL
BUN / CREAT RATIO: 12 (ref 9–23)
BUN: 10 mg/dL (ref 6–24)
CO2: 23 mmol/L (ref 20–29)
Calcium: 9.3 mg/dL (ref 8.7–10.2)
Chloride: 103 mmol/L (ref 96–106)
Creatinine, Ser: 0.84 mg/dL (ref 0.57–1.00)
GFR, EST AFRICAN AMERICAN: 97 mL/min/{1.73_m2} (ref >59)
GFR, EST NON AFRICAN AMERICAN: 84 mL/min/{1.73_m2} (ref >59)
GLUCOSE: 91 mg/dL (ref 65–99)
Potassium: 4.6 mmol/L (ref 3.5–5.2)
SODIUM: 141 mmol/L (ref 134–144)

## 2017-03-08 LAB — TSH: TSH: 1.95 u[IU]/mL (ref 0.450–4.500)

## 2017-03-14 ENCOUNTER — Telehealth: Payer: Self-pay | Admitting: Cardiovascular Disease

## 2017-03-14 NOTE — Telephone Encounter (Signed)
Left message for patient to call back  

## 2017-03-14 NOTE — Telephone Encounter (Signed)
New Message   Pt c/o BP issue:  1. What are your last 5 BP readings? 102/67 2. Are you having any other symptoms (ex. Dizziness, headache, blurred vision, passed out)? No symptoms 3. What is your medication issue? None  Patient states that she feels fine overall. But her bp has never been that low.

## 2017-03-14 NOTE — Telephone Encounter (Signed)
Received call from patient who states she is feeling well but is wondering how low BP can go without being dangerous. She denies fatigue, dizziness, light-headedness or other concerns. I advised her that Dr. Acie Fredrickson would like for her to continue current medications and continue to monitor HR and BP. I advised her that if symptoms develop with hypotension, she can increase her sodium intake. She states she is in agreement to continue current therapy and will call back with worsening symptoms or other concerns. I advised that we can see her sooner than 2/28 if needed. She thanked me for the call.

## 2017-03-14 NOTE — Telephone Encounter (Signed)
Agree with plan / not by Army Melia, RN

## 2017-03-29 ENCOUNTER — Encounter: Payer: Self-pay | Admitting: Cardiovascular Disease

## 2017-03-29 ENCOUNTER — Ambulatory Visit (INDEPENDENT_AMBULATORY_CARE_PROVIDER_SITE_OTHER): Payer: BLUE CROSS/BLUE SHIELD | Admitting: Cardiovascular Disease

## 2017-03-29 VITALS — BP 114/76 | HR 78 | Ht 70.0 in | Wt 225.0 lb

## 2017-03-29 DIAGNOSIS — I5042 Chronic combined systolic (congestive) and diastolic (congestive) heart failure: Secondary | ICD-10-CM | POA: Diagnosis not present

## 2017-03-29 DIAGNOSIS — I447 Left bundle-branch block, unspecified: Secondary | ICD-10-CM | POA: Diagnosis not present

## 2017-03-29 MED ORDER — SACUBITRIL-VALSARTAN 49-51 MG PO TABS: 1.0000 | ORAL_TABLET | Freq: Two times a day (BID) | ORAL | 11 refills | Status: DC

## 2017-03-29 MED ORDER — ROSUVASTATIN CALCIUM 10 MG PO TABS: 10.0000 mg | ORAL_TABLET | Freq: Every day | ORAL | 3 refills | Status: DC

## 2017-03-29 NOTE — Patient Instructions (Addendum)
Medication Instructions:  Your physician has recommended you make the following change in your medication:   INCREASE Entresto to 49-51 mg twice daily START Crestor (Rosuvastatin) 10 mg once daily   Labwork: Your physician recommends that you return for lab work in: 3 months on May 30 anytime after 7:30 am You will need to FAST for this appointment - nothing to eat or drink after midnight the night before except water.   Testing/Procedures: Your physician has requested that you have a lexiscan myoview. For further information please visit [email or web address]. Please follow instruction sheet, as given.   Follow-Up: Your physician recommends that you return for a follow-up appointment on Tuesday April 2 at 7:40 am   If you need a refill on your cardiac medications before your next appointment, please call your pharmacy.   Thank you for choosing [facility]! Christen Bame, RN [phone]

## 2017-03-29 NOTE — Progress Notes (Signed)
Kendra [name] Date of Birth  [birth year] [place] [address]    [place]   [place], [place]  [address]    [place],   [address] [phone]  Fax  [phone]  [phone]  Fax [phone]  Problem List: 1. Left bundle branch block 2. Chest pain 3. Kidney stone 4. Palpitations  5. Chronic  systolic CHF - in the setting of LBBB.  EF 43% by myoview in April  2013.  EF 35% by echo Feb. 2019.      Kendra [name]  is a 47 y.o. female. She presented to her medical Dr. with some upper left-sided abdominal pain/breast pain.  This pain is occasionally pleuritic. She was thought to have a pulmonary illness. A d-dimer was subsequent on and found to be negative the An EKG showed left bundle branch block.   A subsequent echocardiogram revealed dyssynchrony of her septum.  She does not exercise on regular basis. She walks occasionally. She's been in the process of moving. She has noticed that she's a little bit more short of breath moving boxes. She's also noticed some left arm tingling and numbness which radiates down to her fingers.  We performed a Lexiscan Myoview which revealed an EF of 43%.  Overall Impression: Low risk stress nuclear study. No ischemia or infarct. Baseline ECG LBBB with decreased EF  LV Ejection Fraction: 43%. LV Wall Motion: Apical hypokinesis  We initially had her on Toprol-XL but she did not tolerate it. She felt very lethargic. We now have her on carvedilol which he seems to tolerate much better.  She's exercising on occasion.  She has been measuring her BP on occasion.  Her readings have been ok but several have been elevated.  Feb. 27, 2014:  She has had several kidney surgeries since I last saw her.  She had some chest pressure when she woke up for anesthesia. She has had some bad indigestion since then.  Better with antiacids.  She has not been exercising much for the past month.  Her BP has been well  controlled.    Nov. 10, 2014:  She is doing well. Having some PVCs on occasion.  She has been walking some.  She has had occaisonal ankle swelling.    No edema today.  She has a new job.   April 22, 2013:    Kendra [name]  presents today for further evaluation of palpitations. She's been under a little bit more stress than usual. She's also been fatigued.   We placed a Holter monitor. She had sinus tachycardia as well as sinus bradycardia. She has an underlying bundle branch block. There were no arrhythmias to suggest SVT   July 07, 2013:  Kendra [name] is doing well.  Able to do all of her normal activities without problems. Trying to exercise.  She has not had many palpitations.  Sleeping better.    Wants to delay getting the sleep study.  July 28, 2014:  Doing great . Not many palpitations  Exercising  - walking several times a week .   Doing great.  Has lost 8 lbs.   Sept. 1, 2017:  Doing well No CP , no dyspnea Walking ,  Has lost 9 lbs since her last visit .   February 06, 2017:  Feeling well. Has gained some weight.    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  Feb. 5, 2019:  Kendra [name] is seen back for her chronic systolic congestive heart failure, left bundle branch block. Echocardiogram from January 21 reveals reduced left ventricular systolic function with an EF of 35%.   We added Entresto 24-26 mg twice a day. Has not been exercising   Feb. 28, 2019 Seen with husband, Letitia Libra Has had some low BP readings  Since adding Entresto  Has had 2-3 episodes of lightheadedness -  Starting to exercise but she has lots of DOE .  Has noticed some symptoms with exercise since she is short of breath .  Current Outpatient Medications on File Prior to Visit  Medication Sig Dispense Refill  . Ascorbic Acid (VITAMIN C) 250 MG CHEW Chew 1 tablet by mouth daily.     . carvedilol (COREG) 3.125 MG tablet Take 1 tablet (3.125 mg total) by mouth 2 (two) times daily. 60 tablet 11   . ibuprofen (ADVIL,MOTRIN) 800 MG tablet Take 1 tablet (800 mg total) by mouth 3 (three) times daily. 21 tablet 0  . Levonorgestrel (LILETTA, 52 MG,) 19.5 MCG/DAY IUD IUD Liletta 19.5 mcg/24 hrs (5 yrs) 52 mg intrauterine device  Take 1 device by intrauterine route.    . sacubitril-valsartan (ENTRESTO) 24-26 MG Take 1 tablet by mouth 2 (two) times daily. 60 tablet 1   No current facility-administered medications on file prior to visit.     Allergies  Allergen Reactions  . Codeine Nausea And Vomiting  . Penicillins     REACTION: hives    Past Medical History:  Diagnosis Date  . Allergy   . Chronic headache   . Depression   . Former smoker    quit in 45  . Hyperlipidemia   . Kidney stones   . Left bundle branch block   . Obesity   . Systolic dysfunction, left ventricle    EF is 43% per Myoview; dyssynchrony of the septum noted on echo; has LBBB    Past Surgical History:  Procedure Laterality Date  . CHOLECYSTECTOMY  2009  . CYSTOSCOPY WITH RETROGRADE PYELOGRAM, URETEROSCOPY AND STENT PLACEMENT  03/06/2012   Procedure: CYSTOSCOPY WITH RETROGRADE PYELOGRAM, URETEROSCOPY AND STENT PLACEMENT;  Surgeon: Molli Hazard, MD;  Location: [facility];  Service: Urology;  Laterality: Left;  . CYSTOSCOPY WITH RETROGRADE PYELOGRAM, URETEROSCOPY AND STENT PLACEMENT Left 03/20/2012   Procedure: CYSTOSCOPY WITH RETROGRADE PYELOGRAM, URETEROSCOPY AND STENT PLACEMENT;  Surgeon: Molli Hazard, MD;  Location: [facility];  Service: Urology;  Laterality: Left;  balloon dilitation  . FOOT SURGERY  12/2005  . HOLMIUM LASER APPLICATION Left 07/27/3149   Procedure: HOLMIUM LASER APPLICATION;  Surgeon: Molli Hazard, MD;  Location: [facility];  Service: Urology;  Laterality: Left;  . TONSILLECTOMY  03/2010  . WRIST SURGERY  11/2001    Social History   Tobacco Use  Smoking Status Former Smoker  . Last attempt to quit: 01/31/1991  .  Years since quitting: 26.1  Smokeless Tobacco Never Used    Social History   Substance and Sexual Activity  Alcohol Use Yes   Comment: occ    Family History  Problem Relation Age of Onset  . Hyperlipidemia Mother   . Hyperlipidemia Brother   . Hyperlipidemia Brother   . Colon cancer Neg Hx     Reviw of Systems:  Reviewed in the HPI.  All other systems are negative.  Physical Exam: Blood pressure 114/76, pulse 78, height 5\' 10"  (1.778 m), weight 225 lb (102.1  kg), SpO2 98 %.  GEN:  Well nourished, well developed in no acute distress HEENT: Normal NECK: No JVD; No carotid bruits LYMPHATICS: No lymphadenopathy CARDIAC: RR , RESPIRATORY:  Clear to auscultation without rales, wheezing or rhonchi  ABDOMEN: Soft, non-tender, non-distended MUSCULOSKELETAL:  No edema; No deformity  SKIN: Warm and dry NEUROLOGIC:  Alert and oriented x 3   ECG:  Assessment / Plan:   1. Left bundle branch block -   Stable   2.   Chronic  Combined systolic  /  diastolic )  CHF -ejection fraction has worsened since her previous echo.  Her ejection fraction is now 35%.  She has grade 2 diastolic dysfunction.   She was started on Entresto.  She is tolerating low-dose Entresto fairly well but has had a few episodes of dizziness.  We will go up to Entresto 49-51 mg.  If she does not tolerate this we will consider increasing her carvedilol or perhaps adding Aldactone.  We will get a Lexiscan Myoview to rule out ischemia   3. Kidney stone  4. Palpitations - stable      Mertie Moores, MD  03/29/2017 7:48 AM    [facility] [place],  [place], [place]  [address] Pager [phone] Phone: [phone]; Fax: [phone]

## 2017-04-02 ENCOUNTER — Encounter (HOSPITAL_COMMUNITY): Payer: BLUE CROSS/BLUE SHIELD

## 2017-04-03 ENCOUNTER — Telehealth (HOSPITAL_COMMUNITY): Payer: Self-pay | Admitting: *Deleted

## 2017-04-03 NOTE — Telephone Encounter (Signed)
Left message on voicemail per DPR in reference to upcoming appointment scheduled on 04/06/17 with detailed instructions given per Myocardial Perfusion Study Information Sheet for the test. LM to arrive 15 minutes early, and that it is imperative to arrive on time for appointment to keep from having the test rescheduled. If you need to cancel or reschedule your appointment, please call the office within 24 hours of your appointment. Failure to do so may result in a cancellation of your appointment, and a $50 no show fee. Phone number given for call back for any questions. Kendra [name] [name]    

## 2017-04-06 ENCOUNTER — Ambulatory Visit (HOSPITAL_COMMUNITY): Payer: BLUE CROSS/BLUE SHIELD | Attending: Internal Medicine

## 2017-04-06 DIAGNOSIS — I5042 Chronic combined systolic (congestive) and diastolic (congestive) heart failure: Secondary | ICD-10-CM

## 2017-04-06 DIAGNOSIS — I447 Left bundle-branch block, unspecified: Secondary | ICD-10-CM | POA: Diagnosis not present

## 2017-04-06 LAB — MYOCARDIAL PERFUSION IMAGING
CHL CUP NUCLEAR SSS: 10
CSEPPHR: 108 {beats}/min
LV sys vol: 71 mL
LVDIAVOL: 127 mL (ref 46–106)
NUC STRESS TID: 1.1
RATE: 0.37
Rest HR: 75 {beats}/min
SDS: 4
SRS: 6

## 2017-04-06 MED ORDER — TECHNETIUM TC 99M TETROFOSMIN IV KIT
10.8000 | PACK | Freq: Once | INTRAVENOUS | Status: AC | PRN
  Administered 2017-04-06: 10.8 via INTRAVENOUS
  Filled 2017-04-06: qty 11

## 2017-04-06 MED ORDER — TECHNETIUM TC 99M TETROFOSMIN IV KIT
32.3000 | PACK | Freq: Once | INTRAVENOUS | Status: AC | PRN
  Administered 2017-04-06: 32.3 via INTRAVENOUS
  Filled 2017-04-06: qty 33

## 2017-04-06 MED ORDER — REGADENOSON 0.4 MG/5ML IV SOLN
0.4000 mg | Freq: Once | INTRAVENOUS | Status: AC
Start: 2017-04-06 — End: 2017-04-06
  Administered 2017-04-06: 0.4 mg via INTRAVENOUS

## 2017-04-09 ENCOUNTER — Telehealth: Payer: Self-pay | Admitting: Nurse Practitioner

## 2017-04-09 DIAGNOSIS — I5022 Chronic systolic (congestive) heart failure: Secondary | ICD-10-CM

## 2017-04-09 DIAGNOSIS — I447 Left bundle-branch block, unspecified: Secondary | ICD-10-CM

## 2017-04-09 NOTE — Telephone Encounter (Signed)
Coronary CT orders in epic and patient is aware someone will call her to schedule. I will call patient back with instructions once the date is assigned.

## 2017-04-09 NOTE — Telephone Encounter (Signed)
-----   Message from Thayer Headings, MD sent at 04/09/2017 11:44 AM EDT ----- I called and discussed the results with Carie. I think the septal changes are due to the LBBB and told her that we would get a coronary CT angiogram  Scheduling will call to arrange

## 2017-04-10 NOTE — Telephone Encounter (Signed)
Follow up    Patient calling to check on the status of CT order waiting to be schedule .

## 2017-04-10 NOTE — Telephone Encounter (Signed)
Called and made patient aware that the order for the Cardiac CT has been placed. Made her aware that it is first sent to the pre-cert department to be pre-approved by her insurance before scheduling. Made patient aware that this process can take a few weeks depending on her insurance company. Made patient aware that she would be contacted once the test has been scheduled and Dr. Elmarie Shiley RN will contact her to go over pre-procedure instructions. Patient verbalized understanding and thanked me for the call.

## 2017-04-17 DIAGNOSIS — M9904 Segmental and somatic dysfunction of sacral region: Secondary | ICD-10-CM | POA: Diagnosis not present

## 2017-04-17 DIAGNOSIS — M9902 Segmental and somatic dysfunction of thoracic region: Secondary | ICD-10-CM | POA: Diagnosis not present

## 2017-04-17 DIAGNOSIS — M5442 Lumbago with sciatica, left side: Secondary | ICD-10-CM | POA: Diagnosis not present

## 2017-04-19 NOTE — Telephone Encounter (Signed)
Received call from patient who called to ask why her coronary CT has not been scheduled. I advised that I sent a message to our schedulers yesterday with the same question. I advised I will look into this issue and call her back later. She verbalized understanding and agreement.

## 2017-04-19 NOTE — Telephone Encounter (Signed)
Please arrive at the [facility] main entrance of [facility] at xx:xx AM (30-45 minutes prior to test start time)  [facility] [address] [place], [place] [address] [phone]  Proceed to the [facility] Radiology Department (First Floor).  Please follow these instructions carefully (unless otherwise directed):  Hold all erectile dysfunction medications at least 48 hours prior to test.  On the Night Before the Test: . Drink plenty of water. . Do not consume any caffeinated/decaffeinated beverages or chocolate 12 hours prior to your test. . Do not take any antihistamines 12 hours prior to your test. . If you take Metformin do not take 24 hours prior to test. . If the patient has contrast allergy: ? Patient will need a prescription for Prednisone and very clear instructions (as follows): 1. Prednisone 50 mg - take 13 hours prior to test 2. Take another Prednisone 50 mg 7 hours prior to test 3. Take another Prednisone 50 mg 1 hour prior to test 4. Take Benadryl 50 mg 1 hour prior to test . Patient must complete all four doses of above prophylactic medications. . Patient will need a ride after test due to Benadryl.  On the Day of the Test: . Drink plenty of water. Do not drink any water within one hour of the test. . Do not eat any food 4 hours prior to the test. . You may take your regular medications prior to the test. . IF NOT ON A BETA BLOCKER - Take 50 mg of lopressor (metoprolol) one hour before the test. . HOLD Furosemide morning of the test.  After the Test: . Drink plenty of water. . After receiving IV contrast, you may experience a mild flushed feeling. This is normal. . On occasion, you may experience a mild rash up to 24 hours after the test. This is not dangerous. If this occurs, you can take Benadryl 25 mg and increase your fluid intake. . If you experience trouble breathing, this can be serious. If it is severe call 911 IMMEDIATELY. If it  is mild, please call our office. . If you take any of these medications: Glipizide/Metformin, Avandament, Glucavance, please do not take 48 hours after completing test.   Patient's coronary CT is scheduled for 4/3 at 1:30. I reviewed the above instructions with her and she verbalized understanding. She is scheduled for lab appointment on 3/26 for bmet. She has an appointment with Dr. Acie Fredrickson on 4/2 so I advised we will need to move her appointment to after the CT is done. I tentatively scheduled her for 4/8 at 7:40 but will need to discuss with Dr. Acie Fredrickson. I advised her to call back with questions or concerns and she thanked me for the call.

## 2017-04-23 DIAGNOSIS — M9902 Segmental and somatic dysfunction of thoracic region: Secondary | ICD-10-CM | POA: Diagnosis not present

## 2017-04-23 DIAGNOSIS — M9904 Segmental and somatic dysfunction of sacral region: Secondary | ICD-10-CM | POA: Diagnosis not present

## 2017-04-23 DIAGNOSIS — M5442 Lumbago with sciatica, left side: Secondary | ICD-10-CM | POA: Diagnosis not present

## 2017-04-24 ENCOUNTER — Other Ambulatory Visit: Payer: BLUE CROSS/BLUE SHIELD | Admitting: *Deleted

## 2017-04-24 DIAGNOSIS — M9902 Segmental and somatic dysfunction of thoracic region: Secondary | ICD-10-CM | POA: Diagnosis not present

## 2017-04-24 DIAGNOSIS — I447 Left bundle-branch block, unspecified: Secondary | ICD-10-CM | POA: Diagnosis not present

## 2017-04-24 DIAGNOSIS — I5022 Chronic systolic (congestive) heart failure: Secondary | ICD-10-CM | POA: Diagnosis not present

## 2017-04-24 DIAGNOSIS — M9904 Segmental and somatic dysfunction of sacral region: Secondary | ICD-10-CM | POA: Diagnosis not present

## 2017-04-24 DIAGNOSIS — M5442 Lumbago with sciatica, left side: Secondary | ICD-10-CM | POA: Diagnosis not present

## 2017-04-24 LAB — BASIC METABOLIC PANEL
BUN/Creatinine Ratio: 13 (ref 9–23)
BUN: 11 mg/dL (ref 6–24)
CALCIUM: 9.1 mg/dL (ref 8.7–10.2)
CO2: 22 mmol/L (ref 20–29)
CREATININE: 0.87 mg/dL (ref 0.57–1.00)
Chloride: 103 mmol/L (ref 96–106)
GFR, EST AFRICAN AMERICAN: 93 mL/min/{1.73_m2} (ref >59)
GFR, EST NON AFRICAN AMERICAN: 81 mL/min/{1.73_m2} (ref >59)
Glucose: 99 mg/dL (ref 65–99)
POTASSIUM: 4.3 mmol/L (ref 3.5–5.2)
Sodium: 138 mmol/L (ref 134–144)

## 2017-04-25 ENCOUNTER — Encounter: Payer: Self-pay | Admitting: Cardiovascular Disease

## 2017-04-25 ENCOUNTER — Telehealth: Payer: Self-pay | Admitting: Cardiovascular Disease

## 2017-04-25 DIAGNOSIS — M9902 Segmental and somatic dysfunction of thoracic region: Secondary | ICD-10-CM | POA: Diagnosis not present

## 2017-04-25 DIAGNOSIS — M5442 Lumbago with sciatica, left side: Secondary | ICD-10-CM | POA: Diagnosis not present

## 2017-04-25 DIAGNOSIS — M9904 Segmental and somatic dysfunction of sacral region: Secondary | ICD-10-CM | POA: Diagnosis not present

## 2017-04-25 NOTE — Telephone Encounter (Signed)
Kendra [name] is calling to say that she can do the change of appt for the next day at the same time . Please call if you have any questions .Marland Kitchen Thanks

## 2017-04-25 NOTE — Telephone Encounter (Signed)
Noted  

## 2017-05-01 ENCOUNTER — Ambulatory Visit: Payer: BLUE CROSS/BLUE SHIELD | Admitting: Cardiovascular Disease

## 2017-05-02 ENCOUNTER — Other Ambulatory Visit: Payer: BLUE CROSS/BLUE SHIELD

## 2017-05-02 ENCOUNTER — Ambulatory Visit (HOSPITAL_COMMUNITY)
Admission: RE | Admit: 2017-05-02 | Discharge: 2017-05-02 | Disposition: A | Discharge status: Home / Self Care | Payer: BLUE CROSS/BLUE SHIELD | Source: Ambulatory Visit | Attending: Cardiovascular Disease | Admitting: Cardiovascular Disease

## 2017-05-02 ENCOUNTER — Ambulatory Visit (HOSPITAL_COMMUNITY): Payer: BLUE CROSS/BLUE SHIELD

## 2017-05-02 DIAGNOSIS — I281 Aneurysm of pulmonary artery: Secondary | ICD-10-CM | POA: Insufficient documentation

## 2017-05-02 DIAGNOSIS — I447 Left bundle-branch block, unspecified: Secondary | ICD-10-CM | POA: Diagnosis not present

## 2017-05-02 DIAGNOSIS — R0789 Other chest pain: Secondary | ICD-10-CM | POA: Diagnosis not present

## 2017-05-02 DIAGNOSIS — I5022 Chronic systolic (congestive) heart failure: Secondary | ICD-10-CM | POA: Insufficient documentation

## 2017-05-02 MED ORDER — NITROGLYCERIN 0.4 MG SL SUBL
0.8000 mg | SUBLINGUAL_TABLET | SUBLINGUAL | Status: DC | PRN
  Administered 2017-05-02: 0.8 mg via SUBLINGUAL

## 2017-05-02 MED ORDER — METOPROLOL TARTRATE 5 MG/5ML IV SOLN
INTRAVENOUS | Status: AC
  Administered 2017-05-02: 5 mg via INTRAVENOUS
  Filled 2017-05-02: qty 20

## 2017-05-02 MED ORDER — METOPROLOL TARTRATE 5 MG/5ML IV SOLN
5.0000 mg | INTRAVENOUS | Status: AC | PRN
  Administered 2017-05-02 (×4): 5 mg via INTRAVENOUS

## 2017-05-02 MED ORDER — IOPAMIDOL (ISOVUE-370) INJECTION 76%: 100.0000 mL | Freq: Once | INTRAVENOUS | Status: DC | PRN

## 2017-05-02 MED ORDER — NITROGLYCERIN 0.4 MG SL SUBL
SUBLINGUAL_TABLET | SUBLINGUAL | Status: DC
Start: 2017-05-02 — End: 2017-05-03
  Filled 2017-05-02: qty 2

## 2017-05-02 MED ORDER — IOPAMIDOL (ISOVUE-370) INJECTION 76%
INTRAVENOUS | Status: AC
  Administered 2017-05-02: 100 mL
  Filled 2017-05-02: qty 100

## 2017-05-08 ENCOUNTER — Ambulatory Visit (INDEPENDENT_AMBULATORY_CARE_PROVIDER_SITE_OTHER): Payer: BLUE CROSS/BLUE SHIELD | Admitting: Cardiovascular Disease

## 2017-05-08 ENCOUNTER — Encounter: Payer: Self-pay | Admitting: Cardiovascular Disease

## 2017-05-08 VITALS — BP 118/70 | HR 73 | Ht 70.0 in | Wt 225.0 lb

## 2017-05-08 DIAGNOSIS — I5042 Chronic combined systolic (congestive) and diastolic (congestive) heart failure: Secondary | ICD-10-CM | POA: Diagnosis not present

## 2017-05-08 DIAGNOSIS — I447 Left bundle-branch block, unspecified: Secondary | ICD-10-CM | POA: Diagnosis not present

## 2017-05-08 MED ORDER — CARVEDILOL 6.25 MG PO TABS: 6.2500 mg | ORAL_TABLET | Freq: Two times a day (BID) | ORAL | 3 refills | Status: DC

## 2017-05-08 NOTE — Progress Notes (Signed)
[name] Date of Birth  September 26, 1970 [place]    [place]   [place], [place]  [address]    [place], [place]  [address] [phone]  Fax  [phone]  [phone]  Fax [phone]  Problem List: 1. Left bundle branch block 2. Chest pain 3. Kidney stone 4. Palpitations  5. Chronic  systolic CHF - in the setting of LBBB.  EF 43% by myoview in April  2013.  EF 35% by echo Feb. 2019.      Francies  is a 47 y.o. female. She presented to her medical Dr. with some upper left-sided abdominal pain/breast pain.  This pain is occasionally pleuritic. She was thought to have a pulmonary illness. A d-dimer was subsequent on and found to be negative the An EKG showed left bundle branch block.   A subsequent echocardiogram revealed dyssynchrony of her septum.  She does not exercise on regular basis. She walks occasionally. She's been in the process of moving. She has noticed that she's a little bit more short of breath moving boxes. She's also noticed some left arm tingling and numbness which radiates down to her fingers.  We performed a Lexiscan Myoview which revealed an EF of 43%.  Overall Impression: Low risk stress nuclear study. No ischemia or infarct. Baseline ECG LBBB with decreased EF  LV Ejection Fraction: 43%. LV Wall Motion: Apical hypokinesis  We initially had her on Toprol-XL but she did not tolerate it. She felt very lethargic. We now have her on carvedilol which he seems to tolerate much better.  She's exercising on occasion.  She has been measuring her BP on occasion.  Her readings have been ok but several have been elevated.  Feb. 27, 2014:  She has had several kidney surgeries since I last saw her.  She had some chest pressure when she woke up for anesthesia. She has had some bad indigestion since then.  Better with antiacids.  She has not been exercising much for the past month.  Her BP has been well  controlled.    Nov. 10, 2014:  She is doing well. Having some PVCs on occasion.  She has been walking some.  She has had occaisonal ankle swelling.    No edema today.  She has a new job.   April 22, 2013:    Kashonda  presents today for further evaluation of palpitations. She's been under a little bit more stress than usual. She's also been fatigued.   We placed a Holter monitor. She had sinus tachycardia as well as sinus bradycardia. She has an underlying bundle branch block. There were no arrhythmias to suggest SVT   July 07, 2013:  Omie is doing well.  Able to do all of her normal activities without problems. Trying to exercise.  She has not had many palpitations.  Sleeping better.    Wants to delay getting the sleep study.  July 28, 2014:  Doing great . Not many palpitations  Exercising  - walking several times a week .   Doing great.  Has lost 8 lbs.   Sept. 1, 2017:  Doing well No CP , no dyspnea Walking ,  Has lost 9 lbs since her last visit .   February 06, 2017:  Feeling well. Has gained some weight.    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  Feb. 5, 2019:  Uriah is seen back for her chronic systolic congestive heart failure, left bundle branch block. Echocardiogram from January 21 reveals reduced left ventricular systolic function with an EF of 35%.   We added Entresto 24-26 mg twice a day. Has not been exercising   Feb. 28, 2019 Seen with husband, Letitia Libra Has had some low BP readings  Since adding Entresto  Has had 2-3 episodes of lightheadedness -  Starting to exercise but she has lots of DOE .  Has noticed some symptoms with exercise since she is short of breath .  May 08, 2017:  Neleh is seen back for eval of her chf and LBBB  Started on Entresto 49-51  - tolerating it well Had a myoview - which revealed septal defect - thought to be due to LBBB .   Coronary CT angiogram was normal  Still having some DOE  Climbing stairs  No  near syncope ,   Tolerating meds well  Current Outpatient Medications on File Prior to Visit  Medication Sig Dispense Refill  . Ascorbic Acid (VITAMIN C) 250 MG CHEW Chew 1 tablet by mouth daily.     . carvedilol (COREG) 3.125 MG tablet Take 1 tablet (3.125 mg total) by mouth 2 (two) times daily. 60 tablet 11  . ibuprofen (ADVIL,MOTRIN) 800 MG tablet Take 1 tablet (800 mg total) by mouth 3 (three) times daily. 21 tablet 0  . Levonorgestrel (LILETTA, 52 MG,) 19.5 MCG/DAY IUD IUD Liletta 19.5 mcg/24 hrs (5 yrs) 52 mg intrauterine device  Take 1 device by intrauterine route.    . rosuvastatin (CRESTOR) 10 MG tablet Take 1 tablet (10 mg total) by mouth daily. 90 tablet 3  . sacubitril-valsartan (ENTRESTO) 49-51 MG Take 1 tablet by mouth 2 (two) times daily. 60 tablet 11   No current facility-administered medications on file prior to visit.     Allergies  Allergen Reactions  . Codeine Nausea And Vomiting  . Penicillins     REACTION: hives    Past Medical History:  Diagnosis Date  . Allergy   . Chronic headache   . Depression   . Former smoker    quit in 70  . Hyperlipidemia   . Kidney stones   . Left bundle branch block   . Obesity   . Systolic dysfunction, left ventricle    EF is 43% per Myoview; dyssynchrony of the septum noted on echo; has LBBB    Past Surgical History:  Procedure Laterality Date  . CHOLECYSTECTOMY  2009  . CYSTOSCOPY WITH RETROGRADE PYELOGRAM, URETEROSCOPY AND STENT PLACEMENT  03/06/2012   Procedure: CYSTOSCOPY WITH RETROGRADE PYELOGRAM, URETEROSCOPY AND STENT PLACEMENT;  Surgeon: Molli Hazard, MD;  Location: [facility];  Service: Urology;  Laterality: Left;  . CYSTOSCOPY WITH RETROGRADE PYELOGRAM, URETEROSCOPY AND STENT PLACEMENT Left 03/20/2012   Procedure: CYSTOSCOPY WITH RETROGRADE PYELOGRAM, URETEROSCOPY AND STENT PLACEMENT;  Surgeon: Molli Hazard, MD;  Location: [facility];  Service: Urology;  Laterality:  Left;  balloon dilitation  . FOOT SURGERY  12/2005  . HOLMIUM LASER APPLICATION Left 1/61/0960   Procedure: HOLMIUM LASER APPLICATION;  Surgeon: Molli Hazard, MD;  Location: [facility];  Service: Urology;  Laterality: Left;  . TONSILLECTOMY  03/2010  . WRIST SURGERY  11/2001    Social History   Tobacco Use  Smoking Status Former Smoker  . Last attempt to quit: 01/31/1991  . Years since quitting: 26.2  Smokeless Tobacco Never Used  Social History   Substance and Sexual Activity  Alcohol Use Yes   Comment: occ    Family History  Problem Relation Age of Onset  . Hyperlipidemia Mother   . Hyperlipidemia Brother   . Hyperlipidemia Brother   . Colon cancer Neg Hx     Reviw of Systems:  Reviewed in the HPI.  All other systems are negative.  Physical Exam: Blood pressure 118/70, pulse 73, height 5\' 10"  (1.778 m), weight 225 lb (102.1 kg), SpO2 96 %.  GEN:  Well nourished, well developed in no acute distress HEENT: Normal NECK: No JVD; No carotid bruits LYMPHATICS: No lymphadenopathy CARDIAC: RRR , normal S1S2  RESPIRATORY:  Clear to auscultation without rales, wheezing or rhonchi  ABDOMEN: Soft, non-tender, non-distended MUSCULOSKELETAL:  No edema; No deformity  SKIN: Warm and dry NEUROLOGIC:  Alert and oriented x 3   ECG:  Assessment / Plan:   1. Left bundle branch block -   Stable   2.   Chronic  Combined systolic  /  diastolic  CHF   Shadavia is doing well.   Tolerating the Entresto 49-51 mg BID well.  She has had occasional episodes of tachycardi at rest   Will increase Coreg to 6. 25 BID . Will see her again in 2-3 weeks .  Consider adding Aldactone at that point .   We are close to reaching optimal medical therapy    4. Palpitations - stable      Mertie Moores, MD  05/08/2017 7:51 AM    [facility] [place],  [place], [place]  [address] Pager [phone] Phone: [phone]; Fax: [phone]

## 2017-05-08 NOTE — Patient Instructions (Addendum)
Medication Instructions:  Your physician has recommended you make the following change in your medication:  INCREASE Carvedilol (Coreg) to 6.25 mg twice daily   Labwork: None Ordered   Testing/Procedures: None Ordered   Follow-Up: Your physician recommends that you schedule a follow-up appointment in: 3 weeks on Tuesday April 30 at 7:40 am   If you need a refill on your cardiac medications before your next appointment, please call your pharmacy.   Thank you for choosing [facility]! Christen Bame, RN [phone]

## 2017-05-29 ENCOUNTER — Ambulatory Visit: Payer: BLUE CROSS/BLUE SHIELD | Admitting: Cardiovascular Disease

## 2017-05-29 DIAGNOSIS — R0989 Other specified symptoms and signs involving the circulatory and respiratory systems: Secondary | ICD-10-CM

## 2017-05-30 ENCOUNTER — Encounter: Payer: Self-pay | Admitting: Cardiovascular Disease

## 2017-06-15 ENCOUNTER — Ambulatory Visit (INDEPENDENT_AMBULATORY_CARE_PROVIDER_SITE_OTHER): Payer: BLUE CROSS/BLUE SHIELD | Admitting: Cardiovascular Disease

## 2017-06-15 ENCOUNTER — Encounter: Payer: Self-pay | Admitting: Cardiovascular Disease

## 2017-06-15 ENCOUNTER — Telehealth: Payer: Self-pay | Admitting: *Deleted

## 2017-06-15 VITALS — BP 118/78 | HR 78 | Ht 70.0 in | Wt 227.0 lb

## 2017-06-15 DIAGNOSIS — I5022 Chronic systolic (congestive) heart failure: Secondary | ICD-10-CM

## 2017-06-15 LAB — BASIC METABOLIC PANEL
BUN / CREAT RATIO: 12 (ref 9–23)
BUN: 9 mg/dL (ref 6–24)
CO2: 23 mmol/L (ref 20–29)
Calcium: 9.6 mg/dL (ref 8.7–10.2)
Chloride: 100 mmol/L (ref 96–106)
Creatinine, Ser: 0.77 mg/dL (ref 0.57–1.00)
GFR, EST AFRICAN AMERICAN: 107 mL/min/{1.73_m2} (ref >59)
GFR, EST NON AFRICAN AMERICAN: 93 mL/min/{1.73_m2} (ref >59)
Glucose: 95 mg/dL (ref 65–99)
Potassium: 4.9 mmol/L (ref 3.5–5.2)
Sodium: 137 mmol/L (ref 134–144)

## 2017-06-15 MED ORDER — SPIRONOLACTONE 25 MG PO TABS: 25.0000 mg | ORAL_TABLET | Freq: Every day | ORAL | 3 refills | Status: DC

## 2017-06-15 NOTE — Telephone Encounter (Signed)
Spoke with patient and informed. She will pay attention to foods and limit those with higher amounts of potassium. Changed her next lab appointment from 5/30 to 5/28.  She is unable to come in 1 week.

## 2017-06-15 NOTE — Patient Instructions (Signed)
Medication Instructions:  Your physician has recommended you make the following change in your medication:  1.) start aldactone (spironolactone) 25 mg - take one tablet once a day   Labwork: Your physician recommends that you return for lab work today (BMET), and on 06/28/17 (BMET)   Testing/Procedures: Your physician has requested that you have an echocardiogram. Echocardiography is a painless test that uses sound waves to create images of your heart. It provides your doctor with information about the size and shape of your heart and how well your heart's chambers and valves are working. This procedure takes approximately one hour. There are no restrictions for this procedure.  PLEASE SCHEDULE ECHO IN ABOUT 3 MONTHS, JUST PRIOR TO HER NEXT APPOINTMENT WITH DR. Acie Fredrickson IN 3 MONTHS.  Follow-Up: Your physician recommends that you schedule a follow-up appointment in: [place]. Acie Fredrickson.   Any Other Special Instructions Will Be Listed Below (If Applicable).     If you need a refill on your cardiac medications before your next appointment, please call your pharmacy.

## 2017-06-15 NOTE — Progress Notes (Signed)
Kendra [name] Date of Birth  1970-10-07 [facility]        [address]    [place]     [place], [place]  [address]      [phone]  Fax  [phone]       Problem List: 1. Left bundle branch block 2. Chest pain 3. Kidney stone 4. Palpitations  5. Chronic  systolic CHF - in the setting of LBBB.  EF 43% by myoview in April  2013.  EF 35% by echo Feb. 2019.      Kendra [name]  is a 47 y.o. female. She presented to her medical Dr. with some upper left-sided abdominal pain/breast pain.  This pain is occasionally pleuritic. She was thought to have a pulmonary illness. A d-dimer was subsequent on and found to be negative the An EKG showed left bundle branch block.   A subsequent echocardiogram revealed dyssynchrony of her septum.  She does not exercise on regular basis. She walks occasionally. She's been in the process of moving. She has noticed that she's a little bit more short of breath moving boxes. She's also noticed some left arm tingling and numbness which radiates down to her fingers.  We performed a Lexiscan Myoview which revealed an EF of 43%.  Overall Impression: Low risk stress nuclear study. No ischemia or infarct. Baseline ECG LBBB with decreased EF  LV Ejection Fraction: 43%. LV Wall Motion: Apical hypokinesis  We initially had her on Toprol-XL but she did not tolerate it. She felt very lethargic. We now have her on carvedilol which he seems to tolerate much better.  She's exercising on occasion.  She has been measuring her BP on occasion.  Her readings have been ok but several have been elevated.  Feb. 27, 2014:  She has had several kidney surgeries since I last saw her.  She had some chest pressure when she woke up for anesthesia. She has had some bad indigestion since then.  Better with antiacids.  She has not been exercising much for the past month.  Her BP has been well controlled.    Nov. 10, 2014:  She is doing well. Having some PVCs on occasion.   She has been walking some.  She has had occaisonal ankle swelling.    No edema today.  She has a new job.   April 22, 2013:    Kendra [name]  presents today for further evaluation of palpitations. She's been under a little bit more stress than usual. She's also been fatigued.   We placed a Holter monitor. She had sinus tachycardia as well as sinus bradycardia. She has an underlying bundle branch block. There were no arrhythmias to suggest SVT   July 07, 2013:  Kendra [name] is doing well.  Able to do all of her normal activities without problems. Trying to exercise.  She has not had many palpitations.  Sleeping better.    Wants to delay getting the sleep study.  July 28, 2014:  Doing great . Not many palpitations  Exercising  - walking several times a week .   Doing great.  Has lost 8 lbs.   Sept. 1, 2017:  Doing well No CP , no dyspnea Walking ,  Has lost 9 lbs since her last visit .   February 06, 2017:  Feeling well. Has gained some weight.    Works in [employer]  ( [employer])   No CP   Not avoiding salt .  Feb. 5, 2019:  Kendra [name] is seen back for her chronic systolic congestive heart failure, left bundle branch block. Echocardiogram from January 21 reveals reduced left ventricular systolic function with an EF of 35%.   We added Entresto 24-26 mg twice a day. Has not been exercising   Feb. 28, 2019 Seen with husband, Letitia Libra Has had some low BP readings  Since adding Entresto  Has had 2-3 episodes of lightheadedness -  Starting to exercise but she has lots of DOE .  Has noticed some symptoms with exercise since she is short of breath .  May 08, 2017:  Kendra [name] is seen back for eval of her chf and LBBB  Started on Entresto 49-51  - tolerating it well Had a myoview - which revealed septal defect - thought to be due to LBBB .   Coronary CT angiogram was normal  Still having some DOE  Climbing stairs  No near syncope ,   Tolerating meds well  Jun 15, 2017:   Kendra [name] is seen today for  follow-up of her chronic systolic congestive heart failure. We increased her carvedilol to 6.25 mg twice a day at her last visit. Wt = 227,  Tolerating the coreg well  Still has some shortness of breath .   Current Outpatient Medications on File Prior to Visit  Medication Sig Dispense Refill  . Ascorbic Acid (VITAMIN C) 250 MG CHEW Chew 1 tablet by mouth daily.     . carvedilol (COREG) 6.25 MG tablet Take 1 tablet (6.25 mg total) by mouth 2 (two) times daily. 180 tablet 3  . ibuprofen (ADVIL,MOTRIN) 800 MG tablet Take 1 tablet (800 mg total) by mouth 3 (three) times daily. 21 tablet 0  . Levonorgestrel (LILETTA, 52 MG,) 19.5 MCG/DAY IUD IUD Liletta 19.5 mcg/24 hrs (5 yrs) 52 mg intrauterine device  Take 1 device by intrauterine route.    . rosuvastatin (CRESTOR) 10 MG tablet Take 1 tablet (10 mg total) by mouth daily. 90 tablet 3  . sacubitril-valsartan (ENTRESTO) 49-51 MG Take 1 tablet by mouth 2 (two) times daily. 60 tablet 11   No current facility-administered medications on file prior to visit.     Allergies  Allergen Reactions  . Codeine Nausea And Vomiting  . Penicillins     REACTION: hives    Past Medical History:  Diagnosis Date  . Allergy   . Chronic headache   . Depression   . Former smoker    quit in 84  . Hyperlipidemia   . Kidney stones   . Left bundle branch block   . Obesity   . Systolic dysfunction, left ventricle    EF is 43% per Myoview; dyssynchrony of the septum noted on echo; has LBBB    Past Surgical History:  Procedure Laterality Date  . CHOLECYSTECTOMY  2009  . CYSTOSCOPY WITH RETROGRADE PYELOGRAM, URETEROSCOPY AND STENT PLACEMENT  03/06/2012   Procedure: CYSTOSCOPY WITH RETROGRADE PYELOGRAM, URETEROSCOPY AND STENT PLACEMENT;  Surgeon: Molli Hazard, MD;  Location: [facility];  Service: Urology;  Laterality: Left;  . CYSTOSCOPY WITH RETROGRADE PYELOGRAM, URETEROSCOPY AND STENT PLACEMENT Left 03/20/2012   Procedure: CYSTOSCOPY  WITH RETROGRADE PYELOGRAM, URETEROSCOPY AND STENT PLACEMENT;  Surgeon: Molli Hazard, MD;  Location: [facility];  Service: Urology;  Laterality: Left;  balloon dilitation  . FOOT SURGERY  12/2005  . HOLMIUM LASER APPLICATION Left 0/98/1191   Procedure: HOLMIUM LASER APPLICATION;  Surgeon: Molli Hazard, MD;  Location: [facility];  Service: Urology;  Laterality: Left;  . TONSILLECTOMY  03/2010  . WRIST SURGERY  11/2001    Social History   Tobacco Use  Smoking Status Former Smoker  . Last attempt to quit: 01/31/1991  . Years since quitting: 26.3  Smokeless Tobacco Never Used    Social History   Substance and Sexual Activity  Alcohol Use Yes   Comment: occ    Family History  Problem Relation Age of Onset  . Hyperlipidemia Mother   . Hyperlipidemia Brother   . Hyperlipidemia Brother   . Colon cancer Neg Hx     Reviw of Systems: Noted in current history, otherwise review of systems is negative.  Physical Exam: There were no vitals taken for this visit.  GEN:   Young female,  Moderately obese  HEENT: Normal NECK: No JVD; No carotid bruits LYMPHATICS: No lymphadenopathy CARDIAC: RRR   RESPIRATORY:  Clear to auscultation without rales, wheezing or rhonchi  ABDOMEN: Soft, non-tender, non-distended MUSCULOSKELETAL:  No edema; No deformity  SKIN: Warm and dry NEUROLOGIC:  Alert and oriented x 3   ECG:  Assessment / Plan:   1. Left bundle branch block -   Stable   2.   Chronic  Combined systolic  /  diastolic  CHF   Kendra [name] is doing well.   Tolerating the Entresto 49-51 mg BID well.  She has had tolerating carvedilol 6.25 mg twice a day.  We will add Aldactone 25 mg a day.  We will check a basic metabolic profile today and again in 1 to 2 weeks.  At this point I do not think that we can uptitrate her medications significantly further.  We will plan on getting an echocardiogram in 3 months.  I will see her in the office several  days following the echocardiogram and will make a decision on whether or not she will need a biventricular pacemaker.   She is feeling  depressed that we are having to add so many  medications. She previously has been very healthy and was not on any medications.  I tried to reassure her that we would get her feeling better and get her heart work better soon.   4. Palpitations - stable      Mertie Moores, MD  06/15/2017 7:50 AM    [facility] [place],  [place], [place]  [address] Pager [phone] Phone: [phone]; Fax: [phone]

## 2017-06-15 NOTE — Telephone Encounter (Signed)
-----   Message from Thayer Headings, MD sent at 06/15/2017  4:28 PM EDT ----- Potassium is on the higher end of normal Make sure she is not eating lots of foods that are high in potassium ( tomatoes, potatoes, canalope , bananas)  Recheck BMP in 1 week

## 2017-06-26 ENCOUNTER — Other Ambulatory Visit: Payer: BLUE CROSS/BLUE SHIELD | Admitting: *Deleted

## 2017-06-26 DIAGNOSIS — I5042 Chronic combined systolic (congestive) and diastolic (congestive) heart failure: Secondary | ICD-10-CM | POA: Diagnosis not present

## 2017-06-26 DIAGNOSIS — I447 Left bundle-branch block, unspecified: Secondary | ICD-10-CM

## 2017-06-26 LAB — LIPID PANEL
CHOL/HDL RATIO: 2.6 ratio (ref 0.0–4.4)
Cholesterol, Total: 155 mg/dL (ref 100–199)
HDL: 60 mg/dL (ref >39)
LDL Calculated: 78 mg/dL (ref 0–99)
TRIGLYCERIDES: 84 mg/dL (ref 0–149)
VLDL Cholesterol Cal: 17 mg/dL (ref 5–40)

## 2017-06-26 LAB — BASIC METABOLIC PANEL
BUN/Creatinine Ratio: 13 (ref 9–23)
BUN: 11 mg/dL (ref 6–24)
CALCIUM: 9.1 mg/dL (ref 8.7–10.2)
CO2: 21 mmol/L (ref 20–29)
CREATININE: 0.88 mg/dL (ref 0.57–1.00)
Chloride: 103 mmol/L (ref 96–106)
GFR, EST AFRICAN AMERICAN: 91 mL/min/{1.73_m2} (ref >59)
GFR, EST NON AFRICAN AMERICAN: 79 mL/min/{1.73_m2} (ref >59)
Glucose: 99 mg/dL (ref 65–99)
POTASSIUM: 4 mmol/L (ref 3.5–5.2)
Sodium: 140 mmol/L (ref 134–144)

## 2017-06-26 LAB — HEPATIC FUNCTION PANEL
ALBUMIN: 4 g/dL (ref 3.5–5.5)
ALT: 14 IU/L (ref 0–32)
AST: 12 IU/L (ref 0–40)
Alkaline Phosphatase: 69 IU/L (ref 39–117)
Bilirubin Total: 0.7 mg/dL (ref 0.0–1.2)
Bilirubin, Direct: 0.21 mg/dL (ref 0.00–0.40)
Total Protein: 6.4 g/dL (ref 6.0–8.5)

## 2017-06-28 ENCOUNTER — Other Ambulatory Visit: Payer: BLUE CROSS/BLUE SHIELD

## 2017-07-25 ENCOUNTER — Encounter: Payer: Self-pay | Admitting: Cardiovascular Disease

## 2017-08-04 ENCOUNTER — Encounter: Payer: Self-pay | Admitting: Cardiovascular Disease

## 2017-08-07 ENCOUNTER — Encounter: Payer: Self-pay | Admitting: Cardiovascular Disease

## 2017-08-07 ENCOUNTER — Telehealth: Payer: Self-pay

## 2017-08-07 NOTE — Telephone Encounter (Signed)
Per the pts [facility] message on 08/04/17 to Dr Acie Fredrickson I have completed a Prior Review/Certification Faxback Form (as advised by [facility]) in an attempt to get the cost of her Entresto lowered.  Dr Acie Fredrickson has signed the form and I have faxed it to [facility] at [phone].

## 2017-08-09 NOTE — Telephone Encounter (Signed)
Faxed a tier exception to [facility] for ENTRESTO, confirmed with patients pharmacy. Included notes from Dr Acie Fredrickson.

## 2017-09-14 ENCOUNTER — Other Ambulatory Visit: Payer: Self-pay

## 2017-09-14 ENCOUNTER — Ambulatory Visit (HOSPITAL_COMMUNITY): Payer: BLUE CROSS/BLUE SHIELD | Attending: Cardiology

## 2017-09-14 DIAGNOSIS — E785 Hyperlipidemia, unspecified: Secondary | ICD-10-CM | POA: Insufficient documentation

## 2017-09-14 DIAGNOSIS — R002 Palpitations: Secondary | ICD-10-CM | POA: Insufficient documentation

## 2017-09-14 DIAGNOSIS — I5022 Chronic systolic (congestive) heart failure: Secondary | ICD-10-CM | POA: Diagnosis not present

## 2017-09-14 DIAGNOSIS — Z87891 Personal history of nicotine dependence: Secondary | ICD-10-CM | POA: Diagnosis not present

## 2017-09-14 DIAGNOSIS — I447 Left bundle-branch block, unspecified: Secondary | ICD-10-CM | POA: Insufficient documentation

## 2017-09-14 DIAGNOSIS — I340 Nonrheumatic mitral (valve) insufficiency: Secondary | ICD-10-CM | POA: Diagnosis not present

## 2017-09-21 ENCOUNTER — Other Ambulatory Visit (HOSPITAL_COMMUNITY): Payer: BLUE CROSS/BLUE SHIELD

## 2017-09-25 ENCOUNTER — Encounter: Payer: Self-pay | Admitting: Cardiovascular Disease

## 2017-09-30 DIAGNOSIS — Z880 Allergy status to penicillin: Secondary | ICD-10-CM | POA: Diagnosis not present

## 2017-09-30 DIAGNOSIS — T63421A Toxic effect of venom of ants, accidental (unintentional), initial encounter: Secondary | ICD-10-CM | POA: Diagnosis not present

## 2017-10-02 ENCOUNTER — Other Ambulatory Visit: Payer: Self-pay

## 2017-10-02 MED ORDER — EPINEPHRINE 0.3 MG/0.3ML IJ SOAJ: 0.3000 mg | Freq: Once | INTRAMUSCULAR | 0 refills | Status: AC

## 2017-10-02 NOTE — Telephone Encounter (Signed)
appt scheduled and med refilled 

## 2017-10-02 NOTE — Telephone Encounter (Signed)
I pended a px Please schedule f/u then send it in  Thanks

## 2017-10-02 NOTE — Telephone Encounter (Signed)
Copied from [place] [id number]. Topic: General - Other >> Oct 02, 2017 10:59 AM Judyann Munson wrote: Reason for CRM: Patient was bite by a Fire ant and  taken to the hospital in the Ambulance, due to her being allergic, she is requesting to have a EpiPen medication called in.  Preferred Pharmacy: [id number] - [place], [place] - [address] [phone] (Phone) [phone] (Fax)  She is requesting a nurse to give you her call back.

## 2017-10-02 NOTE — Telephone Encounter (Signed)
Pt hasn't been seen in over 3 yrs and no future appts., will route to PCP for approval

## 2017-10-08 ENCOUNTER — Ambulatory Visit: Payer: BLUE CROSS/BLUE SHIELD | Admitting: Cardiovascular Disease

## 2017-10-16 ENCOUNTER — Ambulatory Visit: Payer: BLUE CROSS/BLUE SHIELD | Admitting: Cardiovascular Disease

## 2017-10-22 DIAGNOSIS — M545 Low back pain: Secondary | ICD-10-CM | POA: Diagnosis not present

## 2017-10-25 DIAGNOSIS — M545 Low back pain: Secondary | ICD-10-CM | POA: Diagnosis not present

## 2017-10-29 DIAGNOSIS — M545 Low back pain: Secondary | ICD-10-CM | POA: Diagnosis not present

## 2017-10-30 DIAGNOSIS — D2372 Other benign neoplasm of skin of left lower limb, including hip: Secondary | ICD-10-CM | POA: Diagnosis not present

## 2017-10-30 DIAGNOSIS — M79675 Pain in left toe(s): Secondary | ICD-10-CM | POA: Diagnosis not present

## 2017-11-02 ENCOUNTER — Ambulatory Visit: Payer: BLUE CROSS/BLUE SHIELD | Admitting: Family Medicine

## 2017-11-26 ENCOUNTER — Ambulatory Visit: Payer: BLUE CROSS/BLUE SHIELD | Admitting: Family Medicine

## 2017-11-27 DIAGNOSIS — H6982 Other specified disorders of Eustachian tube, left ear: Secondary | ICD-10-CM | POA: Diagnosis not present

## 2017-11-29 ENCOUNTER — Ambulatory Visit: Payer: BLUE CROSS/BLUE SHIELD | Admitting: Cardiovascular Disease

## 2017-12-07 ENCOUNTER — Encounter (HOSPITAL_BASED_OUTPATIENT_CLINIC_OR_DEPARTMENT_OTHER): Payer: Self-pay

## 2017-12-07 ENCOUNTER — Emergency Department (HOSPITAL_BASED_OUTPATIENT_CLINIC_OR_DEPARTMENT_OTHER): Payer: BLUE CROSS/BLUE SHIELD

## 2017-12-07 ENCOUNTER — Other Ambulatory Visit: Payer: Self-pay

## 2017-12-07 ENCOUNTER — Emergency Department (HOSPITAL_BASED_OUTPATIENT_CLINIC_OR_DEPARTMENT_OTHER)
Admission: EM | Admit: 2017-12-07 | Discharge: 2017-12-07 | Disposition: A | Discharge status: Home / Self Care | Payer: BLUE CROSS/BLUE SHIELD | Attending: Emergency Medicine | Admitting: Emergency Medicine

## 2017-12-07 DIAGNOSIS — Z87891 Personal history of nicotine dependence: Secondary | ICD-10-CM | POA: Insufficient documentation

## 2017-12-07 DIAGNOSIS — Z79899 Other long term (current) drug therapy: Secondary | ICD-10-CM | POA: Insufficient documentation

## 2017-12-07 DIAGNOSIS — Z3202 Encounter for pregnancy test, result negative: Secondary | ICD-10-CM | POA: Insufficient documentation

## 2017-12-07 DIAGNOSIS — N201 Calculus of ureter: Secondary | ICD-10-CM

## 2017-12-07 DIAGNOSIS — R112 Nausea with vomiting, unspecified: Secondary | ICD-10-CM | POA: Diagnosis not present

## 2017-12-07 DIAGNOSIS — N2889 Other specified disorders of kidney and ureter: Secondary | ICD-10-CM

## 2017-12-07 DIAGNOSIS — R109 Unspecified abdominal pain: Secondary | ICD-10-CM | POA: Diagnosis present

## 2017-12-07 DIAGNOSIS — R1110 Vomiting, unspecified: Secondary | ICD-10-CM | POA: Diagnosis not present

## 2017-12-07 DIAGNOSIS — I5022 Chronic systolic (congestive) heart failure: Secondary | ICD-10-CM | POA: Diagnosis not present

## 2017-12-07 DIAGNOSIS — N200 Calculus of kidney: Secondary | ICD-10-CM | POA: Diagnosis not present

## 2017-12-07 LAB — URINALYSIS, ROUTINE W REFLEX MICROSCOPIC
BILIRUBIN URINE: NEGATIVE
GLUCOSE, UA: NEGATIVE mg/dL
KETONES UR: NEGATIVE mg/dL
Nitrite: NEGATIVE
PROTEIN: NEGATIVE mg/dL
Specific Gravity, Urine: 1.01 (ref 1.005–1.030)
pH: 6 (ref 5.0–8.0)

## 2017-12-07 LAB — COMPREHENSIVE METABOLIC PANEL
ALT: 22 U/L (ref 0–44)
ANION GAP: 11 (ref 5–15)
AST: 22 U/L (ref 15–41)
Albumin: 3.9 g/dL (ref 3.5–5.0)
Alkaline Phosphatase: 70 U/L (ref 38–126)
BILIRUBIN TOTAL: 0.6 mg/dL (ref 0.3–1.2)
BUN: 11 mg/dL (ref 6–20)
CO2: 23 mmol/L (ref 22–32)
Calcium: 9.1 mg/dL (ref 8.9–10.3)
Chloride: 105 mmol/L (ref 98–111)
Creatinine, Ser: 0.89 mg/dL (ref 0.44–1.00)
Glucose, Bld: 93 mg/dL (ref 70–99)
Potassium: 3.4 mmol/L — ABNORMAL LOW (ref 3.5–5.1)
Sodium: 139 mmol/L (ref 135–145)
TOTAL PROTEIN: 7.1 g/dL (ref 6.5–8.1)

## 2017-12-07 LAB — CBC WITH DIFFERENTIAL/PLATELET
Abs Immature Granulocytes: 0.03 10*3/uL (ref 0.00–0.07)
Basophils Absolute: 0.1 10*3/uL (ref 0.0–0.1)
Basophils Relative: 1 %
EOS ABS: 0.1 10*3/uL (ref 0.0–0.5)
EOS PCT: 1 %
HEMATOCRIT: 43.9 % (ref 36.0–46.0)
Hemoglobin: 14 g/dL (ref 12.0–15.0)
Immature Granulocytes: 0 %
Lymphocytes Relative: 18 %
Lymphs Abs: 1.7 10*3/uL (ref 0.7–4.0)
MCH: 30.7 pg (ref 26.0–34.0)
MCHC: 31.9 g/dL (ref 30.0–36.0)
MCV: 96.3 fL (ref 80.0–100.0)
MONO ABS: 0.6 10*3/uL (ref 0.1–1.0)
MONOS PCT: 6 %
NEUTROS PCT: 74 %
Neutro Abs: 7.3 10*3/uL (ref 1.7–7.7)
Platelets: 260 10*3/uL (ref 150–400)
RBC: 4.56 MIL/uL (ref 3.87–5.11)
RDW: 12.5 % (ref 11.5–15.5)
WBC: 9.7 10*3/uL (ref 4.0–10.5)
nRBC: 0 % (ref 0.0–0.2)

## 2017-12-07 LAB — PREGNANCY, URINE: Preg Test, Ur: NEGATIVE

## 2017-12-07 LAB — URINALYSIS, MICROSCOPIC (REFLEX): WBC, UA: >50 WBC/hpf (ref 0–5)

## 2017-12-07 LAB — LIPASE, BLOOD: LIPASE: 27 U/L (ref 11–51)

## 2017-12-07 MED ORDER — ONDANSETRON 4 MG PO TBDP: ORAL_TABLET | ORAL | 0 refills | Status: DC

## 2017-12-07 MED ORDER — IOPAMIDOL (ISOVUE-300) INJECTION 61%
100.0000 mL | Freq: Once | INTRAVENOUS | Status: AC | PRN
  Administered 2017-12-07: 100 mL via INTRAVENOUS

## 2017-12-07 MED ORDER — SULFAMETHOXAZOLE-TRIMETHOPRIM 800-160 MG PO TABS: 1.0000 | ORAL_TABLET | Freq: Two times a day (BID) | ORAL | 0 refills | Status: AC

## 2017-12-07 MED ORDER — SULFAMETHOXAZOLE-TRIMETHOPRIM 800-160 MG PO TABS
1.0000 | ORAL_TABLET | Freq: Once | ORAL | Status: AC
  Administered 2017-12-07: 1 via ORAL
  Filled 2017-12-07: qty 1

## 2017-12-07 MED ORDER — OXYCODONE-ACETAMINOPHEN 5-325 MG PO TABS
1.0000 | ORAL_TABLET | Freq: Once | ORAL | Status: AC
  Administered 2017-12-07: 1 via ORAL
  Filled 2017-12-07: qty 1

## 2017-12-07 MED ORDER — MORPHINE SULFATE (PF) 4 MG/ML IV SOLN
4.0000 mg | Freq: Once | INTRAVENOUS | Status: AC
  Administered 2017-12-07: 4 mg via INTRAVENOUS
  Filled 2017-12-07: qty 1

## 2017-12-07 MED ORDER — OXYCODONE-ACETAMINOPHEN 5-325 MG PO TABS
1.0000 | ORAL_TABLET | Freq: Four times a day (QID) | ORAL | 0 refills | Status: DC | PRN
Start: 2017-12-07 — End: 2018-09-02

## 2017-12-07 MED ORDER — SODIUM CHLORIDE 0.9 % IV BOLUS
1000.0000 mL | Freq: Once | INTRAVENOUS | Status: AC
  Administered 2017-12-07: 1000 mL via INTRAVENOUS

## 2017-12-07 MED ORDER — ONDANSETRON HCL 4 MG/2ML IJ SOLN
4.0000 mg | Freq: Once | INTRAMUSCULAR | Status: AC
  Administered 2017-12-07: 4 mg via INTRAVENOUS
  Filled 2017-12-07: qty 2

## 2017-12-07 MED ORDER — TAMSULOSIN HCL 0.4 MG PO CAPS: 0.4000 mg | ORAL_CAPSULE | Freq: Every day | ORAL | 0 refills | Status: DC

## 2017-12-07 MED ORDER — HYDROMORPHONE HCL 1 MG/ML IJ SOLN
1.0000 mg | Freq: Once | INTRAMUSCULAR | Status: AC
  Administered 2017-12-07: 1 mg via INTRAVENOUS
  Filled 2017-12-07: qty 1

## 2017-12-07 MED FILL — OXYCODONE-ACETAMINOPHEN 5-3: 5-325 | 5 days supply | Qty: 20 | Fill #0

## 2017-12-07 MED FILL — SULFAMETHOXAZOLE-TMP DS TAB: 800-160 | 7 days supply | Qty: 14 | Fill #0

## 2017-12-07 MED FILL — ONDANSETRON ODT 4 MG TABLET: 4 | 1 days supply | Qty: 8 | Fill #0

## 2017-12-07 MED FILL — TAMSULOSIN HCL 0.4 MG CAP: 0.4 | 30 days supply | Qty: 30 | Fill #0

## 2017-12-07 NOTE — Discharge Instructions (Addendum)
Your CT scan shows a 5 mm stone stuck in your ureter on the left with a surrounding infection.  Please take antibiotics twice daily and use nausea and pain medicine to help control symptoms and Flomax to help the stone pass, use the urine strainer provided with.  He will need to call urology to schedule follow-up appointment on Monday.  Return to the emergency department ([facility]) if you develop fevers, persistent vomiting or unable to keep down your medications, significantly worsened pain that is not controlled with medications, if you are unable to pass urine or any other new or concerning symptoms develop.

## 2017-12-07 NOTE — ED Triage Notes (Addendum)
C/o left side abd pain, nausea, vomited x 1-sx started yesterday-NAD-steady gait-when asked LMP-pt reports vaginal bleeding x 1.5 weeks with irreg periods-pt tearful/anxious

## 2017-12-07 NOTE — ED Provider Notes (Signed)
[facility] EMERGENCY DEPARTMENT Provider Note   CSN: [id number] Arrival date & time: 12/07/17  1131     History   Chief Complaint Chief Complaint  Patient presents with  . Abdominal Pain    HPI Kendra [name] is a 47 y.o. female.  Kendra [name] is a 47 y.o. Female with a history of kidney stones, left bundle branch block, systolic dysfunction, hyperlipidemia, chronic headaches and depression, who presents to the emergency department for evaluation of left-sided abdominal pain.  Pain started yesterday, came on suddenly and was intermittent in nature with associated nausea and several episodes of vomiting, symptoms seem to ease off so that she was able to get some sleep last night, but when she arrived at work this morning pain returned and was more severe, and associated with several more episodes of nonbloody emesis.  Pain is primarily located in the left lower quadrant.  She denies fevers but does endorse some chills.  Has not had any diarrhea, constipation, melena or hematochezia.  She denies burning or discomfort with urination and has not noted any hematuria.  She is really not sure if this feels like her prior kidney stones, she just reports she is extremely uncomfortable.  No cough, chest pain or shortness of breath.  She has not taken anything for symptoms prior to arrival.  Patient does report she is currently on her menstrual cycle, is currently perimenopausal and has irregular periods.     Past Medical History:  Diagnosis Date  . Allergy   . Chronic headache   . Depression   . Former smoker    quit in 77  . Hyperlipidemia   . Kidney stones   . Left bundle branch block   . Obesity   . Systolic dysfunction, left ventricle    EF is 43% per Myoview; dyssynchrony of the septum noted on echo; has LBBB    Patient Active Problem List   Diagnosis Date Noted  . Acute bronchitis 04/28/2014  . Diarrhea 11/18/2013  . Right flank pain 11/18/2013  . RLQ  abdominal pain 11/18/2013  . Routine general medical examination at a health care facility 08/06/2013  . Palpitations 04/22/2013  . Chronic systolic congestive heart failure ([place]) 08/16/2011  . Left bundle branch block 05/08/2011  . Pleuritic chest pain 04/07/2011  . UTI (lower urinary tract infection) 03/15/2011  . Ureteral stone 03/15/2011  . Screening for HIV (human immunodeficiency virus) 09/16/2010  . WEIGHT LOSS-ABNORMAL 01/11/2009  . DEPRESSION 10/20/2008  . HYPERLIPIDEMIA 06/22/2007  . HEADACHE, CHRONIC 06/22/2007  . NEPHROLITHIASIS, HX OF 06/22/2007  . ECZEMA 03/25/2007    Past Surgical History:  Procedure Laterality Date  . CHOLECYSTECTOMY  2009  . CYSTOSCOPY WITH RETROGRADE PYELOGRAM, URETEROSCOPY AND STENT PLACEMENT  03/06/2012   Procedure: CYSTOSCOPY WITH RETROGRADE PYELOGRAM, URETEROSCOPY AND STENT PLACEMENT;  Surgeon: Molli Hazard, MD;  Location: [facility];  Service: Urology;  Laterality: Left;  . CYSTOSCOPY WITH RETROGRADE PYELOGRAM, URETEROSCOPY AND STENT PLACEMENT Left 03/20/2012   Procedure: CYSTOSCOPY WITH RETROGRADE PYELOGRAM, URETEROSCOPY AND STENT PLACEMENT;  Surgeon: Molli Hazard, MD;  Location: [facility];  Service: Urology;  Laterality: Left;  balloon dilitation  . FOOT SURGERY  12/2005  . HOLMIUM LASER APPLICATION Left 3/81/8299   Procedure: HOLMIUM LASER APPLICATION;  Surgeon: Molli Hazard, MD;  Location: [facility];  Service: Urology;  Laterality: Left;  . TONSILLECTOMY  03/2010  . WRIST SURGERY  11/2001     OB History  None      Home Medications    Prior to Admission medications   Medication Sig Start Date End Date Taking? Authorizing Provider  Ascorbic Acid (VITAMIN C) 250 MG CHEW Chew 1 tablet by mouth daily.     [provider]  carvedilol (COREG) 6.25 MG tablet Take 1 tablet (6.25 mg total) by mouth 2 (two) times daily. 05/08/17   Nahser, Wonda Cheng, MD  ibuprofen  (ADVIL,MOTRIN) 800 MG tablet Take 1 tablet (800 mg total) by mouth 3 (three) times daily. 08/20/16   Law, Bea Graff, PA-C  Levonorgestrel (LILETTA, 52 MG,) 19.5 MCG/DAY IUD IUD Liletta 19.5 mcg/24 hrs (5 yrs) 52 mg intrauterine device  Take 1 device by intrauterine route.    [provider]  ondansetron (ZOFRAN ODT) 4 MG disintegrating tablet 4mg  ODT q4 hours prn nausea/vomit 12/07/17   Jacqlyn Larsen, PA-C  oxyCODONE-acetaminophen (PERCOCET) 5-325 MG tablet Take 1-2 tablets by mouth every 6 (six) hours as needed. 12/07/17   Jacqlyn Larsen, PA-C  rosuvastatin (CRESTOR) 10 MG tablet Take 1 tablet (10 mg total) by mouth daily. 03/29/17   Nahser, Wonda Cheng, MD  sacubitril-valsartan (ENTRESTO) 49-51 MG Take 1 tablet by mouth 2 (two) times daily. 03/29/17   Nahser, Wonda Cheng, MD  spironolactone (ALDACTONE) 25 MG tablet Take 1 tablet (25 mg total) by mouth daily. 06/15/17   Nahser, Wonda Cheng, MD  sulfamethoxazole-trimethoprim (BACTRIM DS,SEPTRA DS) 800-160 MG tablet Take 1 tablet by mouth 2 (two) times daily for 7 days. 12/07/17 12/14/17  Jacqlyn Larsen, PA-C  tamsulosin (FLOMAX) 0.4 MG CAPS capsule Take 1 capsule (0.4 mg total) by mouth daily. 12/07/17   Jacqlyn Larsen, PA-C    Family History Family History  Problem Relation Age of Onset  . Hyperlipidemia Mother   . Hyperlipidemia Brother   . Hyperlipidemia Brother   . Colon cancer Neg Hx     Social History Social History   Tobacco Use  . Smoking status: Former Smoker    Last attempt to quit: 01/31/1991    Years since quitting: 26.8  . Smokeless tobacco: Never Used  Substance Use Topics  . Alcohol use: Yes    Comment: occ  . Drug use: No     Allergies   Codeine and Penicillins   Review of Systems Review of Systems  Constitutional: Positive for chills. Negative for fever.  HENT: Negative.   Respiratory: Negative for cough and shortness of breath.   Cardiovascular: Negative for chest pain.  Gastrointestinal: Positive for  abdominal pain, nausea and vomiting. Negative for blood in stool, constipation and diarrhea.  Genitourinary: Positive for flank pain and vaginal bleeding. Negative for dysuria, frequency, hematuria and pelvic pain.  Musculoskeletal: Negative for arthralgias, back pain and myalgias.  Skin: Negative for color change and rash.  Neurological: Negative for dizziness, syncope and light-headedness.     Physical Exam Updated Vital Signs BP (!) 145/113 (BP Location: Left Arm)   Pulse (!) 106   Temp 98.3 F (36.8 C) (Oral)   Resp 20   Ht 5\' 10"  (1.778 m)   Wt 103 kg   SpO2 100%   BMI 32.57 kg/m   Physical Exam  Constitutional: She is oriented to person, place, and time. She appears well-developed and well-nourished. She does not appear ill. No distress.  HENT:  Head: Normocephalic and atraumatic.  Mouth/Throat: Oropharynx is clear and moist.  Eyes: Right eye exhibits no discharge. Left eye exhibits no discharge.  Cardiovascular: Normal rate, regular rhythm,  normal heart sounds and intact distal pulses.  Pulmonary/Chest: Effort normal and breath sounds normal. No respiratory distress.  Respirations equal and unlabored, patient able to speak in full sentences, lungs clear to auscultation bilaterally  Abdominal: Soft. Normal appearance and bowel sounds are normal. There is tenderness in the left lower quadrant. There is guarding and CVA tenderness (Left (mild)). There is no rigidity and no rebound.  Abdomen is soft, nondistended, bowel sounds present throughout, there is focal tenderness in the left lower quadrant with mild guarding there is also some mild CVA tenderness on the left.  Neurological: She is alert and oriented to person, place, and time. Coordination normal.  Skin: Skin is warm and dry. Capillary refill takes less than 2 seconds. She is not diaphoretic.  Psychiatric: She has a normal mood and affect. Her behavior is normal.  Nursing note and vitals reviewed.    ED Treatments /  Results  Labs (all labs ordered are listed, but only abnormal results are displayed) Labs Reviewed  URINALYSIS, ROUTINE W REFLEX MICROSCOPIC - Abnormal; Notable for the following components:      Result Value   APPearance HAZY (*)    Hgb urine dipstick LARGE (*)    Leukocytes, UA LARGE (*)    All other components within normal limits  COMPREHENSIVE METABOLIC PANEL - Abnormal; Notable for the following components:   Potassium 3.4 (*)    All other components within normal limits  URINALYSIS, MICROSCOPIC (REFLEX) - Abnormal; Notable for the following components:   Bacteria, UA MANY (*)    All other components within normal limits  URINE CULTURE  PREGNANCY, URINE  LIPASE, BLOOD  CBC WITH DIFFERENTIAL/PLATELET    EKG None  Radiology Ct Abdomen Pelvis W Contrast  Result Date: 12/07/2017 CLINICAL DATA:  Abdominal pain radiating to the back. Nausea and vomiting. EXAM: CT ABDOMEN AND PELVIS WITH CONTRAST TECHNIQUE: Multidetector CT imaging of the abdomen and pelvis was performed using the standard protocol following bolus administration of intravenous contrast. CONTRAST:  132mL ISOVUE-300 IOPAMIDOL (ISOVUE-300) INJECTION 61% COMPARISON:  None. FINDINGS: Lower chest: No acute abnormality. Hepatobiliary: No focal liver abnormality is seen. Status post cholecystectomy. No biliary dilatation. Pancreas: Unremarkable. No pancreatic ductal dilatation or surrounding inflammatory changes. Spleen: Normal in size without focal abnormality. Adrenals/Urinary Tract: Normal appearance of the adrenal glands. 6 mm nonobstructive right lower pole renal calculus. Normal appearance of the right ureter. Normal appearance of the urinary bladder. There is an obstructive 5 mm calculus within the distal 2/3 of the left ureter. There is a mild upstream left hydroureter and hydronephrosis. Additionally, there is indistinctness and hyperenhancement of the wall of the left ureter and the left renal collecting system,  suggestive of superimposed infectious changes. Stomach/Bowel: Stomach is within normal limits. Appendix appears normal. No evidence of bowel wall thickening, distention, or inflammatory changes. Vascular/Lymphatic: No significant vascular findings are present. No enlarged abdominal or pelvic lymph nodes. Reproductive: IUD in place within the uterus. Normal right adnexa. 4.3 cm left ovarian cyst. Other: No abdominal wall hernia or abnormality. No abdominopelvic ascites. Musculoskeletal: No acute or significant osseous findings. IMPRESSION: 5 mm left mid to distal ureteral obstructive calculus. Indistinctness and hyperenhancement of the walls of the proximal ureter at the left renal collecting system are suggestive of superimposed infectious changes. No enhancement abnormalities of the left renal parenchyma to suggest frank acute pyelonephritis. 6 mm nonobstructive right lower pole renal calculus. 4.3 cm left ovarian cyst. This is most likely benign in a premenopausal patient. Follow-up and  6-8 weeks with pelvic ultrasound may be considered. Electronically Signed   By: Fidela Salisbury M.D.   On: 12/07/2017 14:16    Procedures Procedures (including critical care time)  Medications Ordered in ED Medications  morphine 4 MG/ML injection 4 mg (4 mg Intravenous Given 12/07/17 1247)  ondansetron (ZOFRAN) injection 4 mg (4 mg Intravenous Given 12/07/17 1246)  sodium chloride 0.9 % bolus 1,000 mL ( Intravenous Stopped 12/07/17 1436)  HYDROmorphone (DILAUDID) injection 1 mg (1 mg Intravenous Given 12/07/17 1359)  iopamidol (ISOVUE-300) 61 % injection 100 mL (100 mLs Intravenous Contrast Given 12/07/17 1343)  sulfamethoxazole-trimethoprim (BACTRIM DS,SEPTRA DS) 800-160 MG per tablet 1 tablet (1 tablet Oral Given 12/07/17 1547)  ondansetron (ZOFRAN) injection 4 mg (4 mg Intravenous Given 12/07/17 1527)  oxyCODONE-acetaminophen (PERCOCET/ROXICET) 5-325 MG per tablet 1 tablet (1 tablet Oral Given 12/07/17 1547)      Initial Impression / Assessment and Plan / ED Course  I have reviewed the triage vital signs and the nursing notes.  Pertinent labs & imaging results that were available during my care of the patient were reviewed by me and considered in my medical decision making (see chart for details).  Patient presents to the emergency department for acute onset left-sided abdominal pain that started yesterday with associated nausea, vomiting and chills.  No associated diarrhea, melena or hematochezia, denies any urinary symptoms.  History of kidney stones, thinks this may feel similar.  On arrival patient appears uncomfortable and has difficulty sitting still, mildly tachycardic but this significantly improved with pain management, all other vitals stable and patient afebrile.  On exam patient has focal tenderness in the left lower quadrant with some guarding and also has some mild left CVA tenderness, exam is otherwise unremarkable.  Will get abdominal labs, urinalysis and culture, and CT abdomen pelvis with contrast.  IV fluids, morphine and Zofran for symptomatic management.  Labs show no leukocytosis and normal hemoglobin, potassium is 3.4, do not feel this is low enough to require intervention, all other electrolytes within normal limits, normal renal and liver function normal lipase.  Urinalysis shows large leukocytes and hemoglobin with greater than 50 white blood cells and many bacteria present concerning for infection, I think the patient could have infected stone versus pyelonephritis.  Despite morphine patient continues to be very uncomfortable, 1 g of Dilaudid ordered.  CT shows a 5 mm stone in the mid distal left ureter hyperenhancement of the walls of the proximal ureter suggestive of infection, no evidence of pyelonephritis, there is a 6 mm nonobstructing stone in the right lower renal pole and a 4.3 cm left ovarian cyst.  Case discussed with Dr. Noah Delaine with urology, who recommends  treating patient with Bactrim and sending urine culture, and as long as patient's pain is well controlled and she is tolerating p.o. that she can be discharged home with close follow-up in the office and strict return precautions.  I discussed this with the patient and after Dilaudid she is definitely more comfortable, although still complaining of some nausea will give additional dose of Zofran and p.o. challenge, and give first dose of Bactrim here in the ED.  Patient is remaining comfortable on p.o. pain medication and tolerating fluids with no further vomiting.  At this time I feel she is stable for discharge home with prescriptions for pain medication, Zofran, Flomax and Bactrim, I have discussed strict return precautions and instructed patient to go to [facility] if she has any further complications.  She is to call  to schedule follow-up appointment with urology.  Patient expresses understanding and is in agreement with plan.  Final Clinical Impressions(s) / ED Diagnoses   Final diagnoses:  Left ureteral stone  Ureteritis  Non-intractable vomiting with nausea, unspecified vomiting type    ED Discharge Orders         Ordered    ondansetron (ZOFRAN ODT) 4 MG disintegrating tablet     12/07/17 1628    oxyCODONE-acetaminophen (PERCOCET) 5-325 MG tablet  Every 6 hours PRN     12/07/17 1628    sulfamethoxazole-trimethoprim (BACTRIM DS,SEPTRA DS) 800-160 MG tablet  2 times daily     12/07/17 1628    tamsulosin (FLOMAX) 0.4 MG CAPS capsule  Daily     12/07/17 1628           Jacqlyn Larsen, PA-C 12/07/17 1730    Sherwood Gambler, MD 12/08/17 0740

## 2017-12-09 LAB — URINE CULTURE

## 2017-12-10 ENCOUNTER — Telehealth: Payer: Self-pay

## 2017-12-10 ENCOUNTER — Telehealth: Payer: Self-pay | Admitting: Emergency Medicine

## 2017-12-10 NOTE — Telephone Encounter (Signed)
Left message for patient to call back, need to schedule an ER visit follow up with Dr Glori Bickers but patient needs to also have re establish appointment too.

## 2017-12-10 NOTE — Telephone Encounter (Signed)
Post ED Visit - Positive Culture Follow-up  Culture report reviewed by antimicrobial stewardship pharmacist:  []  Elenor Quinones, Pharm.D. []  Heide Guile, Pharm.D., BCPS AQ-ID []  Parks Neptune, Pharm.D., BCPS []  Alycia Rossetti, Pharm.D., BCPS []  Twain, Pharm.D., BCPS, AAHIVP []  Legrand Como, Pharm.D., BCPS, AAHIVP []  Salome Arnt, PharmD, BCPS []  Johnnette Gourd, PharmD, BCPS []  Hughes Better, PharmD, BCPS []  Leeroy Cha, PharmD [facility] PharmD  Positive urine culture Treated with sulfamethoxazole-trimethoprim, organism sensitive to the same and no further patient follow-up is required at this time.  Hazle Nordmann 12/10/2017, 1:11 PM

## 2017-12-13 DIAGNOSIS — Z6833 Body mass index (BMI) 33.0-33.9, adult: Secondary | ICD-10-CM | POA: Diagnosis not present

## 2017-12-13 DIAGNOSIS — Z1231 Encounter for screening mammogram for malignant neoplasm of breast: Secondary | ICD-10-CM | POA: Diagnosis not present

## 2017-12-13 DIAGNOSIS — Z130 Encounter for screening for diseases of the blood and blood-forming organs and certain disorders involving the immune mechanism: Secondary | ICD-10-CM | POA: Diagnosis not present

## 2017-12-13 DIAGNOSIS — Z01419 Encounter for gynecological examination (general) (routine) without abnormal findings: Secondary | ICD-10-CM | POA: Diagnosis not present

## 2017-12-13 DIAGNOSIS — Z1389 Encounter for screening for other disorder: Secondary | ICD-10-CM | POA: Diagnosis not present

## 2017-12-31 DIAGNOSIS — J206 Acute bronchitis due to rhinovirus: Secondary | ICD-10-CM | POA: Diagnosis not present

## 2018-01-01 DIAGNOSIS — Z6833 Body mass index (BMI) 33.0-33.9, adult: Secondary | ICD-10-CM | POA: Diagnosis not present

## 2018-01-01 DIAGNOSIS — J9801 Acute bronchospasm: Secondary | ICD-10-CM | POA: Diagnosis not present

## 2018-01-01 DIAGNOSIS — R05 Cough: Secondary | ICD-10-CM | POA: Diagnosis not present

## 2018-01-04 DIAGNOSIS — R05 Cough: Secondary | ICD-10-CM | POA: Diagnosis not present

## 2018-01-04 DIAGNOSIS — J9801 Acute bronchospasm: Secondary | ICD-10-CM | POA: Diagnosis not present

## 2018-01-04 DIAGNOSIS — Z6833 Body mass index (BMI) 33.0-33.9, adult: Secondary | ICD-10-CM | POA: Diagnosis not present

## 2018-01-04 DIAGNOSIS — J189 Pneumonia, unspecified organism: Secondary | ICD-10-CM | POA: Diagnosis not present

## 2018-01-14 DIAGNOSIS — N202 Calculus of kidney with calculus of ureter: Secondary | ICD-10-CM | POA: Diagnosis not present

## 2018-01-18 ENCOUNTER — Other Ambulatory Visit: Payer: Self-pay | Admitting: Urology

## 2018-01-18 ENCOUNTER — Encounter (HOSPITAL_COMMUNITY): Payer: Self-pay | Admitting: *Deleted

## 2018-01-18 NOTE — Progress Notes (Signed)
Instructed to arrive 0915 on 01/24/18 to admitting. NPO after midnight prior to procedure. No aspirin or NSAIDS. Need responsible person to be with you afterwards. Take cardiac meds with sip of water morning of procedure. Patient verbalizes understanding.

## 2018-01-24 ENCOUNTER — Encounter (HOSPITAL_COMMUNITY): Payer: Self-pay | Admitting: General Practice

## 2018-01-24 ENCOUNTER — Ambulatory Visit (HOSPITAL_COMMUNITY): Payer: BLUE CROSS/BLUE SHIELD

## 2018-01-24 ENCOUNTER — Ambulatory Visit (HOSPITAL_COMMUNITY)
Admission: RE | Admit: 2018-01-24 | Discharge: 2018-01-24 | Disposition: A | Discharge status: Home / Self Care | Payer: BLUE CROSS/BLUE SHIELD | Attending: Urology | Admitting: Urology

## 2018-01-24 ENCOUNTER — Encounter (HOSPITAL_COMMUNITY)
Admission: RE | Disposition: A | Discharge status: Home / Self Care | Payer: Self-pay | Source: Home / Self Care | Attending: Urology

## 2018-01-24 DIAGNOSIS — I10 Essential (primary) hypertension: Secondary | ICD-10-CM | POA: Diagnosis not present

## 2018-01-24 DIAGNOSIS — N200 Calculus of kidney: Secondary | ICD-10-CM | POA: Diagnosis present

## 2018-01-24 DIAGNOSIS — E669 Obesity, unspecified: Secondary | ICD-10-CM | POA: Diagnosis not present

## 2018-01-24 DIAGNOSIS — N202 Calculus of kidney with calculus of ureter: Secondary | ICD-10-CM | POA: Diagnosis not present

## 2018-01-24 DIAGNOSIS — N135 Crossing vessel and stricture of ureter without hydronephrosis: Secondary | ICD-10-CM

## 2018-01-24 DIAGNOSIS — N201 Calculus of ureter: Secondary | ICD-10-CM | POA: Diagnosis not present

## 2018-01-24 DIAGNOSIS — Z6833 Body mass index (BMI) 33.0-33.9, adult: Secondary | ICD-10-CM | POA: Diagnosis not present

## 2018-01-24 DIAGNOSIS — Z01818 Encounter for other preprocedural examination: Secondary | ICD-10-CM | POA: Diagnosis not present

## 2018-01-24 DIAGNOSIS — Z79899 Other long term (current) drug therapy: Secondary | ICD-10-CM | POA: Insufficient documentation

## 2018-01-24 HISTORY — DX: Personal history of urinary calculi: Z87.442

## 2018-01-24 HISTORY — PX: EXTRACORPOREAL SHOCK WAVE LITHOTRIPSY: SHX1557

## 2018-01-24 LAB — PREGNANCY, URINE: Preg Test, Ur: NEGATIVE

## 2018-01-24 SURGERY — LITHOTRIPSY, ESWL
Anesthesia: LOCAL | Laterality: Left

## 2018-01-24 MED ORDER — DIPHENHYDRAMINE HCL 25 MG PO CAPS
25.0000 mg | ORAL_CAPSULE | ORAL | Status: AC
  Administered 2018-01-24: 25 mg via ORAL
  Filled 2018-01-24: qty 1

## 2018-01-24 MED ORDER — SODIUM CHLORIDE 0.9 % IV SOLN
INTRAVENOUS | Status: DC
  Administered 2018-01-24: 09:00:00 via INTRAVENOUS

## 2018-01-24 MED ORDER — CIPROFLOXACIN HCL 500 MG PO TABS
500.0000 mg | ORAL_TABLET | ORAL | Status: AC
  Administered 2018-01-24: 500 mg via ORAL
  Filled 2018-01-24: qty 1

## 2018-01-24 MED ORDER — DIAZEPAM 5 MG PO TABS
10.0000 mg | ORAL_TABLET | ORAL | Status: AC
  Administered 2018-01-24: 10 mg via ORAL
  Filled 2018-01-24: qty 2

## 2018-01-24 NOTE — H&P (Signed)
Kendra [name] is an 47 y.o. female.    Chief Complaint: Pre-op LEFT Shockwave Lithotripsy  HPI:   1 - LEFT Ureteral Stone - 69mm left distal stone by CT 12/2017 on eval flank pain. Stone is 15mm, 375HU, SSD 12cm at level of upper 1/4 left femoral head. UCX negative.  Past Medical History:  Diagnosis Date  . Allergy   . Chronic headache   . Depression   . Former smoker    quit in 50  . History of kidney stones   . Hyperlipidemia   . Left bundle branch block   . Obesity   . Systolic dysfunction, left ventricle    EF is 43% per Myoview; dyssynchrony of the septum noted on echo; has LBBB    Past Surgical History:  Procedure Laterality Date  . CHOLECYSTECTOMY  2009  . CYSTOSCOPY WITH RETROGRADE PYELOGRAM, URETEROSCOPY AND STENT PLACEMENT  03/06/2012   Procedure: CYSTOSCOPY WITH RETROGRADE PYELOGRAM, URETEROSCOPY AND STENT PLACEMENT;  Surgeon: Molli Hazard, MD;  Location: [facility];  Service: Urology;  Laterality: Left;  . CYSTOSCOPY WITH RETROGRADE PYELOGRAM, URETEROSCOPY AND STENT PLACEMENT Left 03/20/2012   Procedure: CYSTOSCOPY WITH RETROGRADE PYELOGRAM, URETEROSCOPY AND STENT PLACEMENT;  Surgeon: Molli Hazard, MD;  Location: [facility];  Service: Urology;  Laterality: Left;  balloon dilitation  . FOOT SURGERY  12/2005  . HOLMIUM LASER APPLICATION Left 7/42/5956   Procedure: HOLMIUM LASER APPLICATION;  Surgeon: Molli Hazard, MD;  Location: [facility];  Service: Urology;  Laterality: Left;  . TONSILLECTOMY  03/2010  . WRIST SURGERY  11/2001    Family History  Problem Relation Age of Onset  . Hyperlipidemia Mother   . Hyperlipidemia Brother   . Hyperlipidemia Brother   . Colon cancer Neg Hx    Social History:  reports that she quit smoking about 27 years ago. She has never used smokeless tobacco. She reports current alcohol use. She reports that she does not use drugs.  Allergies:  Allergies   Allergen Reactions  . Codeine Nausea And Vomiting  . Penicillins     REACTION: hives    No medications prior to admission.    No results found for this or any previous visit (from the past 48 hour(s)). No results found.  Review of Systems  Constitutional: Negative.  Negative for chills and fever.  HENT: Negative.   Eyes: Negative.   Respiratory: Negative.   Cardiovascular: Negative.   Gastrointestinal: Negative.   Genitourinary: Positive for flank pain.  Skin: Negative.   Neurological: Negative.   Endo/Heme/Allergies: Negative.   Psychiatric/Behavioral: Negative.     There were no vitals taken for this visit. Physical Exam  Constitutional: She appears well-developed.  HENT:  Head: Normocephalic.  Eyes: Pupils are equal, round, and reactive to light.  Neck: Normal range of motion.  Cardiovascular: Normal rate.  Respiratory: Effort normal.  GI: Soft.  Genitourinary:    Genitourinary Comments: Very mild left CVAT at present   Neurological: She is alert.  Skin: Skin is warm.  Psychiatric: She has a normal mood and affect.     Assessment/Plan  Proceed as planned with LEFT shockwave lithotripsy. Risks, benefits, alternatives, expected peri-treatment course discussed previously and reiterated today.   Alexis Frock, MD 01/24/2018, 5:45 AM

## 2018-01-24 NOTE — Brief Op Note (Signed)
01/24/2018  12:57 PM  PATIENT:  Kendra [name]  47 y.o. female  PRE-OPERATIVE DIAGNOSIS:  LEFT URETEROPELVIC JUNCTION STONE  POST-OPERATIVE DIAGNOSIS:  * No post-op diagnosis entered *  PROCEDURE:  Procedure(s): EXTRACORPOREAL SHOCK WAVE LITHOTRIPSY (ESWL) (Left)  SURGEON:  Surgeon(s) and Role:    * Alexis Frock, MD - Primary  PHYSICIAN ASSISTANT:   ASSISTANTS: none   ANESTHESIA:   MAC  EBL:  minimal   BLOOD ADMINISTERED:none  DRAINS: none   LOCAL MEDICATIONS USED:  NONE  SPECIMEN:  No Specimen  DISPOSITION OF SPECIMEN:  N/A  COUNTS:  YES  TOURNIQUET:  * No tourniquets in log *  DICTATION: .Note written in paper chart  PLAN OF CARE: Discharge to home after PACU  PATIENT DISPOSITION:  PACU - hemodynamically stable.   Delay start of Pharmacological VTE agent (>24hrs) due to surgical blood loss or risk of bleeding: yes

## 2018-01-24 NOTE — Progress Notes (Deleted)
On SWL truck, stone NOT able to be localized. IVP performed and prompt excretion of left sided contrast to bladder rule out significant ureteral obstruction.   Will keep OV as scheduled for F/U.

## 2018-01-24 NOTE — Discharge Instructions (Signed)
1 - Call MD or go to ER for fever >102, severe pain / nausea / vomiting not relieved by medications, or acute change in medical status  

## 2018-01-28 ENCOUNTER — Encounter (HOSPITAL_COMMUNITY): Payer: Self-pay | Admitting: Urology

## 2018-01-28 ENCOUNTER — Other Ambulatory Visit: Payer: Self-pay | Admitting: Cardiovascular Disease

## 2018-01-28 DIAGNOSIS — I5042 Chronic combined systolic (congestive) and diastolic (congestive) heart failure: Secondary | ICD-10-CM

## 2018-01-28 MED ORDER — SPIRONOLACTONE 25 MG PO TABS: 25.0000 mg | ORAL_TABLET | Freq: Every day | ORAL | 1 refills | Status: DC

## 2018-01-28 MED ORDER — SACUBITRIL-VALSARTAN 49-51 MG PO TABS: 1.0000 | ORAL_TABLET | Freq: Two times a day (BID) | ORAL | 1 refills | Status: DC

## 2018-01-28 MED ORDER — CARVEDILOL 6.25 MG PO TABS: 6.2500 mg | ORAL_TABLET | Freq: Two times a day (BID) | ORAL | 1 refills | Status: DC

## 2018-01-28 MED ORDER — ROSUVASTATIN CALCIUM 10 MG PO TABS: 10.0000 mg | ORAL_TABLET | Freq: Every day | ORAL | 1 refills | Status: DC

## 2018-01-28 NOTE — Telephone Encounter (Signed)
Pt's medications were sent to pt's pharmacy as requested. Confirmation received.  

## 2018-02-28 DIAGNOSIS — R8271 Bacteriuria: Secondary | ICD-10-CM | POA: Diagnosis not present

## 2018-02-28 DIAGNOSIS — N202 Calculus of kidney with calculus of ureter: Secondary | ICD-10-CM | POA: Diagnosis not present

## 2018-03-01 ENCOUNTER — Other Ambulatory Visit: Payer: Self-pay | Admitting: Urology

## 2018-03-06 NOTE — Progress Notes (Signed)
09-14-17 ( Epic) ECHO  06-15-17 (Epic) LOV w/Cardiologist Dr. Acie Fredrickson  04-16-17 (Epic) Stress

## 2018-03-06 NOTE — Patient Instructions (Addendum)
ISYSS ESPINAL  03/06/2018   Your procedure is scheduled on: 03-08-18   Report to [facility] Main  Entrance    Report to Admitting at 11:00 AM    Call this number if you have problems the morning of surgery [phone]    Remember: Do not eat food or drink liquids :After Midnight.    BRUSH YOUR TEETH MORNING OF SURGERY AND RINSE YOUR MOUTH OUT, NO CHEWING GUM CANDY OR MINTS.     Take these medicines the morning of surgery with A SIP OF WATER: Carvedilol (Coreg), Rosuvastatin (Crestor)                                You may not have any metal on your body including hair pins and              piercings  Do not wear jewelry, make-up, lotions, powders or perfumes, deodorant             Do not wear nail polish.  Do not shave  48 hours prior to surgery.                Do not bring valuables to the hospital. [place].  Contacts, dentures or bridgework may not be worn into surgery. .     Patients discharged the day of surgery will not be allowed to drive home. IF YOU ARE HAVING SURGERY AND GOING HOME THE SAME DAY, YOU MUST HAVE AN ADULT TO DRIVE YOU HOME AND BE WITH YOU FOR 24 HOURS. YOU MAY GO HOME BY TAXI OR UBER OR ORTHERWISE, BUT AN ADULT MUST ACCOMPANY YOU HOME AND STAY WITH YOU FOR 24 HOURS.     Name and phone number of your driver: Rutherford Nail [phone]  Special Instructions: N/A              Please read over the following fact sheets you were given: _____________________________________________________________________             [facility] - Preparing for Surgery Before surgery, you can play an important role.  Because skin is not sterile, your skin needs to be as free of germs as possible.  You can reduce the number of germs on your skin by washing with CHG (chlorahexidine gluconate) soap before surgery.  CHG is an antiseptic cleaner which kills germs and bonds with the skin to continue  killing germs even after washing. Please DO NOT use if you have an allergy to CHG or antibacterial soaps.  If your skin becomes reddened/irritated stop using the CHG and inform your nurse when you arrive at Short Stay. Do not shave (including legs and underarms) for at least 48 hours prior to the first CHG shower.  You may shave your face/neck. Please follow these instructions carefully:  1.  Shower with CHG Soap the night before surgery and the  morning of Surgery.  2.  If you choose to wash your hair, wash your hair first as usual with your  normal  shampoo.  3.  After you shampoo, rinse your hair and body thoroughly to remove the  shampoo.  4.  Use CHG as you would any other liquid soap.  You can apply chg directly  to the skin and wash                       Gently with a scrungie or clean washcloth.  5.  Apply the CHG Soap to your body ONLY FROM THE NECK DOWN.   Do not use on face/ open                           Wound or open sores. Avoid contact with eyes, ears mouth and genitals (private parts).                       Wash face,  Genitals (private parts) with your normal soap.             6.  Wash thoroughly, paying special attention to the area where your surgery  will be performed.  7.  Thoroughly rinse your body with warm water from the neck down.  8.  DO NOT shower/wash with your normal soap after using and rinsing off  the CHG Soap.                9.  Pat yourself dry with a clean towel.            10.  Wear clean pajamas.            11.  Place clean sheets on your bed the night of your first shower and do not  sleep with pets. Day of Surgery : Do not apply any lotions/deodorants the morning of surgery.  Please wear clean clothes to the hospital/surgery center.  FAILURE TO FOLLOW THESE INSTRUCTIONS MAY RESULT IN THE CANCELLATION OF YOUR SURGERY PATIENT SIGNATURE_________________________________  NURSE  SIGNATURE__________________________________  ________________________________________________________________________

## 2018-03-07 ENCOUNTER — Encounter (HOSPITAL_COMMUNITY): Payer: Self-pay

## 2018-03-07 ENCOUNTER — Encounter (HOSPITAL_COMMUNITY)
Admission: RE | Admit: 2018-03-07 | Discharge: 2018-03-07 | Disposition: A | Discharge status: Home / Self Care | Payer: BLUE CROSS/BLUE SHIELD | Source: Ambulatory Visit | Attending: Urology | Admitting: Urology

## 2018-03-07 ENCOUNTER — Other Ambulatory Visit: Payer: Self-pay

## 2018-03-07 DIAGNOSIS — N211 Calculus in urethra: Secondary | ICD-10-CM | POA: Insufficient documentation

## 2018-03-07 DIAGNOSIS — R9431 Abnormal electrocardiogram [ECG] [EKG]: Secondary | ICD-10-CM | POA: Diagnosis not present

## 2018-03-07 DIAGNOSIS — Z01818 Encounter for other preprocedural examination: Secondary | ICD-10-CM | POA: Insufficient documentation

## 2018-03-07 DIAGNOSIS — I447 Left bundle-branch block, unspecified: Secondary | ICD-10-CM | POA: Insufficient documentation

## 2018-03-07 DIAGNOSIS — I10 Essential (primary) hypertension: Secondary | ICD-10-CM | POA: Diagnosis not present

## 2018-03-07 LAB — BASIC METABOLIC PANEL
Anion gap: 8 (ref 5–15)
BUN: 12 mg/dL (ref 6–20)
CO2: 24 mmol/L (ref 22–32)
Calcium: 9.3 mg/dL (ref 8.9–10.3)
Chloride: 108 mmol/L (ref 98–111)
Creatinine, Ser: 0.77 mg/dL (ref 0.44–1.00)
GFR calc Af Amer: >60 mL/min (ref >60)
GFR calc non Af Amer: >60 mL/min (ref >60)
Glucose, Bld: 104 mg/dL — ABNORMAL HIGH (ref 70–99)
Potassium: 4.1 mmol/L (ref 3.5–5.1)
Sodium: 140 mmol/L (ref 135–145)

## 2018-03-07 LAB — CBC
HCT: 42.4 % (ref 36.0–46.0)
Hemoglobin: 13.4 g/dL (ref 12.0–15.0)
MCH: 29.6 pg (ref 26.0–34.0)
MCHC: 31.6 g/dL (ref 30.0–36.0)
MCV: 93.8 fL (ref 80.0–100.0)
Platelets: 275 10*3/uL (ref 150–400)
RBC: 4.52 MIL/uL (ref 3.87–5.11)
RDW: 12.1 % (ref 11.5–15.5)
WBC: 6.9 10*3/uL (ref 4.0–10.5)
nRBC: 0 % (ref 0.0–0.2)

## 2018-03-07 LAB — PREGNANCY, URINE: Preg Test, Ur: NEGATIVE

## 2018-03-07 NOTE — Anesthesia Preprocedure Evaluation (Addendum)
Anesthesia Evaluation  Patient identified by MRN, date of birth, ID band Patient awake    Reviewed: Allergy & Precautions, NPO status , Patient's Chart, lab work & pertinent test results, reviewed documented beta blocker date and time   History of Anesthesia Complications Negative for: history of anesthetic complications  Airway Mallampati: I  TM Distance: >3 FB Neck ROM: Full    Dental  (+) Dental Advisory Given, Teeth Intact   Pulmonary former smoker,    breath sounds clear to auscultation       Cardiovascular Exercise Tolerance: Good hypertension, Pt. on medications and Pt. on home beta blockers +CHF  + dysrhythmias  Rhythm:Regular Rate:Normal   LBBB  '19 TTE - EF 45% to 50%. Diffuse hypokinesis. Septal motion showed abnormal function, dyssynergy, and bounce. Mild MR.  '19 Myoperfusion - Inferoseptal and anteroseptal defecton consistent with scar and/or soft tissue attenuation. No significant ischemia. Nuclear stress EF: 44%. Recommend echo if not done to fully evaluate wall motion, LVEF This is an intermediate risk study.   Neuro/Psych  Headaches, PSYCHIATRIC DISORDERS Depression    GI/Hepatic negative GI ROS, Neg liver ROS,   Endo/Other  negative endocrine ROS  Renal/GU negative Renal ROS     Musculoskeletal negative musculoskeletal ROS (+)   Abdominal   Peds  Hematology negative hematology ROS (+)   Anesthesia Other Findings   Reproductive/Obstetrics                           Anesthesia Physical Anesthesia Plan  ASA: II  Anesthesia Plan: General   Post-op Pain Management:  Regional for Post-op pain   Induction: Intravenous  PONV Risk Score and Plan: 3 and Treatment may vary due to age or medical condition, Ondansetron, Dexamethasone and Midazolam  Airway Management Planned: LMA  Additional Equipment: None  Intra-op Plan:   Post-operative Plan: Extubation in  OR  Informed Consent: I have reviewed the patients History and Physical, chart, labs and discussed the procedure including the risks, benefits and alternatives for the proposed anesthesia with the patient or authorized representative who has indicated his/her understanding and acceptance.     Dental advisory given  Plan Discussed with: CRNA and Anesthesiologist  Anesthesia Plan Comments:       Anesthesia Quick Evaluation

## 2018-03-07 NOTE — Progress Notes (Signed)
Anesthesia Chart Review   Case:  [id number] Date/Time:  03/08/18 1244   Procedure:  CYSTOSCOPY/RETROGRADE/URETEROSCOPY/HOLMIUM LASER/STENT PLACEMENT (Left )   Anesthesia type:  General   Pre-op diagnosis:  LEFT URETERAL CALCULUS   Location:  [place] 02 / [facility]   Surgeon:  Ceasar Mons, MD      DISCUSSION: 48 yo former smoker (quit 01/31/91) with h/o depression, LBBB, CHF, HLD, left ureteral calculus scheduled for above surgery on 03/08/18 with Dr. Harrell Gave Lovena Neighbours.   Pt last seen by cardiologist, Dr. Mertie Moores, on 06/15/17.  At this visit pt doing well.  Per Dr. Julious Payer note echo ordered to recheck EF and decide if pt needed biventricular pacemaker.  Echo 09/14/17 with improved EF 45-50%, per Dr. Cathie Olden no pacer needed and pt should be able to do almost all/all of her normal daily activities.  Stable since last visit.   Pt can proceed with planned procedure barring acute status change.   VS: BP (!) 147/93   Pulse 94   Temp 37.1 C (Oral)   Resp 18   Ht 5\' 10"  (1.778 m)   Wt 105.2 kg   LMP 02/25/2018 (Approximate)   SpO2 100%   BMI 33.29 kg/m   PROVIDERS: Tower, Wynelle Fanny, MD is PCP   Mertie Moores, MD is Cardiologist  LABS: Labs reviewed: Acceptable for surgery. (all labs ordered are listed, but only abnormal results are displayed)  Labs Reviewed  BASIC METABOLIC PANEL - Abnormal; Notable for the following components:      Result Value   Glucose, Bld 104 (*)    All other components within normal limits  CBC  PREGNANCY, URINE     IMAGES:   EKG: 03/07/2018 Rate 69 bpm Normal sinus rhythm Left bundle branch block  Abnormal ECG  CV: Echo 09/14/17 Study Conclusions  - Left ventricle: The cavity size was normal. Systolic function was   mildly reduced. The estimated ejection fraction was in the range   of 45% to 50%. Diffuse hypokinesis. Left ventricular diastolic   function parameters were normal. - Ventricular septum: Septal motion showed  abnormal function,   dyssynergy, and &quot;bounce&quot;. - Mitral valve: There was mild regurgitation.  Impressions:  - EF is improved when compared prior (35%)  CT Coronary 05/02/17 IMPRESSION: 1. Coronary calcium score of 0. This was 0 percentile for age and sex matched control.  2. Normal coronary origin with right dominance.  3. No evidence of CAD.  4. Mildly dilated pulmonary artery measuring 31 mm.  Stress Test 04/06/17  Inferoseptal and anteroseptal defecton consistent with scar and/or soft tissue attenuation No significant ischemia.  Nuclear stress EF: 44%.  REcommend echo if not done to fully evaluate wall motion, LVEF  This is an intermediate risk study.    Past Medical History:  Diagnosis Date  . Allergy   . Chronic headache   . Depression   . Former smoker    quit in 43  . History of kidney stones   . Hyperlipidemia   . Left bundle branch block   . Obesity   . Systolic dysfunction, left ventricle    EF is 43% per Myoview; dyssynchrony of the septum noted on echo; has LBBB    Past Surgical History:  Procedure Laterality Date  . CHOLECYSTECTOMY  2009  . CYSTOSCOPY WITH RETROGRADE PYELOGRAM, URETEROSCOPY AND STENT PLACEMENT  03/06/2012   Procedure: CYSTOSCOPY WITH RETROGRADE PYELOGRAM, URETEROSCOPY AND STENT PLACEMENT;  Surgeon: Molli Hazard, MD;  Location: [facility];  Service: Urology;  Laterality: Left;  . CYSTOSCOPY WITH RETROGRADE PYELOGRAM, URETEROSCOPY AND STENT PLACEMENT Left 03/20/2012   Procedure: CYSTOSCOPY WITH RETROGRADE PYELOGRAM, URETEROSCOPY AND STENT PLACEMENT;  Surgeon: Molli Hazard, MD;  Location: [facility];  Service: Urology;  Laterality: Left;  balloon dilitation  . EXTRACORPOREAL SHOCK WAVE LITHOTRIPSY Left 01/24/2018   Procedure: EXTRACORPOREAL SHOCK WAVE LITHOTRIPSY (ESWL);  Surgeon: Alexis Frock, MD;  Location: [facility];  Service: Urology;  Laterality: Left;  . FOOT SURGERY   12/2005  . HOLMIUM LASER APPLICATION Left 6/96/2952   Procedure: HOLMIUM LASER APPLICATION;  Surgeon: Molli Hazard, MD;  Location: [facility];  Service: Urology;  Laterality: Left;  . TONSILLECTOMY  03/2010  . WRIST SURGERY  11/2001    MEDICATIONS: . carvedilol (COREG) 6.25 MG tablet  . ibuprofen (ADVIL,MOTRIN) 200 MG tablet  . Levonorgestrel (LILETTA, 52 MG,) 19.5 MCG/DAY IUD IUD  . Multiple Vitamin (MULTIVITAMIN WITH MINERALS) TABS tablet  . ondansetron (ZOFRAN ODT) 4 MG disintegrating tablet  . oxyCODONE-acetaminophen (PERCOCET) 5-325 MG tablet  . rosuvastatin (CRESTOR) 10 MG tablet  . sacubitril-valsartan (ENTRESTO) 49-51 MG  . spironolactone (ALDACTONE) 25 MG tablet  . tamsulosin (FLOMAX) 0.4 MG CAPS capsule  . vitamin B-12 (CYANOCOBALAMIN) 500 MCG tablet   No current facility-administered medications for this encounter.    Maia Plan [facility] Pre-Surgical Testing [phone] 03/07/18 1:56 PM

## 2018-03-08 ENCOUNTER — Ambulatory Visit (HOSPITAL_COMMUNITY): Payer: BLUE CROSS/BLUE SHIELD | Admitting: Physician Assistant

## 2018-03-08 ENCOUNTER — Ambulatory Visit (HOSPITAL_COMMUNITY)
Admission: RE | Admit: 2018-03-08 | Discharge: 2018-03-08 | Disposition: A | Discharge status: Home / Self Care | Payer: BLUE CROSS/BLUE SHIELD | Attending: Urology | Admitting: Urology

## 2018-03-08 ENCOUNTER — Encounter (HOSPITAL_COMMUNITY)
Admission: RE | Disposition: A | Discharge status: Home / Self Care | Payer: Self-pay | Source: Home / Self Care | Attending: Urology

## 2018-03-08 ENCOUNTER — Ambulatory Visit (HOSPITAL_COMMUNITY): Payer: BLUE CROSS/BLUE SHIELD | Admitting: Anesthesiology

## 2018-03-08 ENCOUNTER — Ambulatory Visit (HOSPITAL_COMMUNITY): Payer: BLUE CROSS/BLUE SHIELD

## 2018-03-08 DIAGNOSIS — I10 Essential (primary) hypertension: Secondary | ICD-10-CM | POA: Insufficient documentation

## 2018-03-08 DIAGNOSIS — I110 Hypertensive heart disease with heart failure: Secondary | ICD-10-CM | POA: Diagnosis not present

## 2018-03-08 DIAGNOSIS — N202 Calculus of kidney with calculus of ureter: Secondary | ICD-10-CM | POA: Diagnosis not present

## 2018-03-08 DIAGNOSIS — N201 Calculus of ureter: Secondary | ICD-10-CM | POA: Diagnosis not present

## 2018-03-08 DIAGNOSIS — N132 Hydronephrosis with renal and ureteral calculous obstruction: Secondary | ICD-10-CM | POA: Diagnosis not present

## 2018-03-08 DIAGNOSIS — N200 Calculus of kidney: Secondary | ICD-10-CM | POA: Diagnosis not present

## 2018-03-08 DIAGNOSIS — Z87891 Personal history of nicotine dependence: Secondary | ICD-10-CM | POA: Diagnosis not present

## 2018-03-08 DIAGNOSIS — E785 Hyperlipidemia, unspecified: Secondary | ICD-10-CM | POA: Diagnosis not present

## 2018-03-08 DIAGNOSIS — I5022 Chronic systolic (congestive) heart failure: Secondary | ICD-10-CM | POA: Diagnosis not present

## 2018-03-08 HISTORY — PX: CYSTOSCOPY/URETEROSCOPY/HOLMIUM LASER/STENT PLACEMENT: SHX6546

## 2018-03-08 SURGERY — CYSTOSCOPY/URETEROSCOPY/HOLMIUM LASER/STENT PLACEMENT
Anesthesia: General | Site: Ureter | Laterality: Left

## 2018-03-08 MED ORDER — KETOROLAC TROMETHAMINE 30 MG/ML IJ SOLN
INTRAMUSCULAR | Status: DC | PRN
  Administered 2018-03-08: 30 mg via INTRAVENOUS

## 2018-03-08 MED ORDER — FENTANYL CITRATE (PF) 100 MCG/2ML IJ SOLN
INTRAMUSCULAR | Status: AC
  Filled 2018-03-08: qty 2

## 2018-03-08 MED ORDER — PROMETHAZINE HCL 25 MG/ML IJ SOLN
6.2500 mg | INTRAMUSCULAR | Status: DC | PRN
  Administered 2018-03-08: 6.25 mg via INTRAVENOUS

## 2018-03-08 MED ORDER — SODIUM CHLORIDE 0.9 % IV SOLN
INTRAVENOUS | Status: DC | PRN
  Administered 2018-03-08: 10 mL

## 2018-03-08 MED ORDER — MIDAZOLAM HCL 5 MG/5ML IJ SOLN
INTRAMUSCULAR | Status: DC | PRN
  Administered 2018-03-08: 2 mg via INTRAVENOUS

## 2018-03-08 MED ORDER — DEXAMETHASONE SODIUM PHOSPHATE 10 MG/ML IJ SOLN
INTRAMUSCULAR | Status: AC
  Filled 2018-03-08: qty 1

## 2018-03-08 MED ORDER — LACTATED RINGERS IV SOLN
INTRAVENOUS | Status: DC
  Administered 2018-03-08: 12:00:00 via INTRAVENOUS

## 2018-03-08 MED ORDER — PROPOFOL 10 MG/ML IV BOLUS
INTRAVENOUS | Status: DC | PRN
  Administered 2018-03-08: 170 mg via INTRAVENOUS

## 2018-03-08 MED ORDER — OXYCODONE HCL 5 MG/5ML PO SOLN
5.0000 mg | Freq: Once | ORAL | Status: DC | PRN
  Filled 2018-03-08: qty 5

## 2018-03-08 MED ORDER — OXYBUTYNIN CHLORIDE 5 MG PO TABS: 5.0000 mg | ORAL_TABLET | Freq: Three times a day (TID) | ORAL | 1 refills | Status: DC | PRN

## 2018-03-08 MED ORDER — OXYCODONE HCL 5 MG PO TABS: 5.0000 mg | ORAL_TABLET | Freq: Once | ORAL | Status: DC | PRN

## 2018-03-08 MED ORDER — ONDANSETRON HCL 4 MG/2ML IJ SOLN
INTRAMUSCULAR | Status: AC
  Filled 2018-03-08: qty 2

## 2018-03-08 MED ORDER — LIDOCAINE 2% (20 MG/ML) 5 ML SYRINGE
INTRAMUSCULAR | Status: DC | PRN
  Administered 2018-03-08: 100 mg via INTRAVENOUS

## 2018-03-08 MED ORDER — PHENAZOPYRIDINE HCL 200 MG PO TABS: 200.0000 mg | ORAL_TABLET | Freq: Three times a day (TID) | ORAL | 0 refills | Status: DC | PRN

## 2018-03-08 MED ORDER — KETOROLAC TROMETHAMINE 10 MG PO TABS: 10.0000 mg | ORAL_TABLET | Freq: Four times a day (QID) | ORAL | 0 refills | Status: DC | PRN

## 2018-03-08 MED ORDER — ONDANSETRON HCL 4 MG/2ML IJ SOLN
INTRAMUSCULAR | Status: DC | PRN
  Administered 2018-03-08: 4 mg via INTRAVENOUS

## 2018-03-08 MED ORDER — PROPOFOL 10 MG/ML IV BOLUS
INTRAVENOUS | Status: AC
  Filled 2018-03-08: qty 20

## 2018-03-08 MED ORDER — CIPROFLOXACIN IN D5W 400 MG/200ML IV SOLN
400.0000 mg | Freq: Once | INTRAVENOUS | Status: AC
  Administered 2018-03-08: 400 mg via INTRAVENOUS
  Filled 2018-03-08: qty 200

## 2018-03-08 MED ORDER — DEXAMETHASONE SODIUM PHOSPHATE 10 MG/ML IJ SOLN
INTRAMUSCULAR | Status: DC | PRN
  Administered 2018-03-08: 10 mg via INTRAVENOUS

## 2018-03-08 MED ORDER — KETOROLAC TROMETHAMINE 30 MG/ML IJ SOLN
INTRAMUSCULAR | Status: AC
  Filled 2018-03-08: qty 1

## 2018-03-08 MED ORDER — FENTANYL CITRATE (PF) 100 MCG/2ML IJ SOLN
INTRAMUSCULAR | Status: DC | PRN
  Administered 2018-03-08 (×2): 50 ug via INTRAVENOUS

## 2018-03-08 MED ORDER — SODIUM CHLORIDE 0.9 % IR SOLN
Status: DC | PRN
  Administered 2018-03-08: 3000 mL

## 2018-03-08 MED ORDER — EPHEDRINE 5 MG/ML INJ
INTRAVENOUS | Status: AC
  Filled 2018-03-08: qty 10

## 2018-03-08 MED ORDER — PROMETHAZINE HCL 25 MG/ML IJ SOLN
INTRAMUSCULAR | Status: AC
  Filled 2018-03-08: qty 1

## 2018-03-08 MED ORDER — FENTANYL CITRATE (PF) 100 MCG/2ML IJ SOLN
25.0000 ug | INTRAMUSCULAR | Status: DC | PRN
  Administered 2018-03-08 (×2): 50 ug via INTRAVENOUS

## 2018-03-08 MED ORDER — LIDOCAINE 2% (20 MG/ML) 5 ML SYRINGE
INTRAMUSCULAR | Status: AC
  Filled 2018-03-08: qty 5

## 2018-03-08 MED ORDER — MIDAZOLAM HCL 2 MG/2ML IJ SOLN
INTRAMUSCULAR | Status: AC
  Filled 2018-03-08: qty 2

## 2018-03-08 MED ORDER — EPHEDRINE SULFATE-NACL 50-0.9 MG/10ML-% IV SOSY
PREFILLED_SYRINGE | INTRAVENOUS | Status: DC | PRN
  Administered 2018-03-08: 10 mg via INTRAVENOUS

## 2018-03-08 SURGICAL SUPPLY — 18 items
BAG URO CATCHER STRL LF (MISCELLANEOUS) ×2 IMPLANT
BASKET ZERO TIP NITINOL 2.4FR (BASKET) IMPLANT
CATH URET 5FR 28IN OPEN ENDED (CATHETERS) ×2 IMPLANT
CLOTH BEACON ORANGE TIMEOUT ST (SAFETY) ×2 IMPLANT
COVER WAND RF STERILE (DRAPES) IMPLANT
EXTRACTOR STONE NITINOL NGAGE (UROLOGICAL SUPPLIES) ×1 IMPLANT
FIBER LASER FLEXIVA 365 (UROLOGICAL SUPPLIES) IMPLANT
FIBER LASER TRAC TIP (UROLOGICAL SUPPLIES) IMPLANT
GLOVE BIOGEL M STRL SZ7.5 (GLOVE) ×2 IMPLANT
GOWN STRL REUS W/TWL XL LVL3 (GOWN DISPOSABLE) ×2 IMPLANT
GUIDEWIRE STR DUAL SENSOR (WIRE) IMPLANT
GUIDEWIRE ZIPWRE .038 STRAIGHT (WIRE) ×2 IMPLANT
MANIFOLD NEPTUNE II (INSTRUMENTS) ×2 IMPLANT
PACK CYSTO (CUSTOM PROCEDURE TRAY) ×2 IMPLANT
SHEATH URETERAL 12FRX35CM (MISCELLANEOUS) IMPLANT
STENT URET 6FRX26 CONTOUR (STENTS) IMPLANT
TUBING CONNECTING 10 (TUBING) ×2 IMPLANT
TUBING UROLOGY SET (TUBING) ×2 IMPLANT

## 2018-03-08 NOTE — Op Note (Signed)
Operative Note  Preoperative diagnosis:  1.  Left renal colic 2.  History of kidney stones  Postoperative diagnosis: 1.  Same  Procedure(s): 1.  Cystoscopy with left ureteroscopy, basket stone removal and left ureteral stent placement 2.  Left retrograde pyelogram with intraoperative interpretation of fluoroscopic imaging  Surgeon: Ellison Hughs, MD  Assistants:  None  Anesthesia:  General  Complications:  None  EBL:  <5 mL  Specimens: 1. Left renal stone  Drains/Catheters: 1.  Left 75F x 24 cm JJ stent w/o tether  Intraoperative findings:   1. Solitary left collecting system with no filling defects or dilation involving the left ureter or left renal pelvis seen on retrograde pyelogram 2. There was no evidence of ureteral stones, ureteral stricture/narrowing within the lumen of the left ureter.  There was a punctate left renal stone that was extracted with an engage basket  Indication:  Kendra [name] is a 48 y.o. female with a history of kidney stones.  She was recently treated via ESWL on 01/24/18 for a 4 mm left UVJ, but has failed to pass but only a few small stone fragments and has residual, intermittent episodes of left renal colic.  She denies N/V/F/C or interval UTIs.  Multiple KUBs in the office were inconclusive as to the persistence of her left ureteral stone, but she did have mild hydronephrosis on RUS.    The patient has been consented for the above procedures, voices understanding and wishes to proceed.  Description of procedure: After informed consent was obtained, the patient was brought to the operating room and general LMA anesthesia was administered. The patient was then placed in the dorsolithotomy position and prepped and draped in the usual sterile fashion. A timeout was performed. A 23 French rigid cystoscope was then inserted into the urethral meatus and advanced into the bladder under direct vision. A complete bladder survey revealed no  intravesical pathology.  A 5 French ureteral catheter was then inserted into the left ureteral orifice and a left retrograde pyelogram was obtained, the findings listed above.  A Glidewire was then advanced through the lumen of the ureteral catheter and up to the left renal pelvis, or fluoroscopic guidance.  A flexible ureteroscope was then advanced alongside the left-sided wire and a full inspection of the left ureter was performed, identifying no evidence of an obstructing calculus or evidence of ureteral narrowing/stricture.  I advanced the flexible ureteroscope up to the left renal pelvis where a 2 mm calculus was identified in a midpole calyx.  An engage basket was then used to extract the stone.  A 6 French by 24 cm JJ stent was then placed over the wire and into good position within the left collecting system, confirming placement via fluoroscopy.  The tether of the stent was left intact and tucked into the vaginal vault.  The patient's bladder was drained.  She tolerated the procedure well and was transferred to the postanesthesia in stable condition.  Plan: The patient has been instructed to remove her JJ stent at 6 AM on 03/11/2018.  There is no clear urologic source of her ongoing left-sided pain.  It could potentially be due to her 4 cm left ovarian cyst, that was seen on CT from 12/07/2017.

## 2018-03-08 NOTE — Transfer of Care (Signed)
Immediate Anesthesia Transfer of Care Note  Patient: Kendra [name]  Procedure(s) Performed: CYSTOSCOPY/RETROGRADE/URETEROSCOPY/STENT PLACEMENT (Left Ureter)  Patient Location: PACU  Anesthesia Type:General  Level of Consciousness: sedated  Airway & Oxygen Therapy: Patient Spontanous Breathing and Patient connected to face mask oxygen  Post-op Assessment: Report given to RN and Post -op Vital signs reviewed and stable  Post vital signs: Reviewed and stable  Last Vitals:  Vitals Value Taken Time  BP    Temp    Pulse 99 03/08/2018  1:54 PM  Resp    SpO2 100 % 03/08/2018  1:54 PM  Vitals shown include unvalidated device data.  Last Pain:  Vitals:   03/08/18 1123  TempSrc: Oral         Complications: No apparent anesthesia complications

## 2018-03-08 NOTE — Anesthesia Procedure Notes (Addendum)
Procedure Name: LMA Insertion Date/Time: 03/08/2018 1:07 PM Performed by: Lind Covert, CRNA Pre-anesthesia Checklist: Patient identified, Emergency Drugs available, Suction available, Patient being monitored and Timeout performed Patient Re-evaluated:Patient Re-evaluated prior to induction Oxygen Delivery Method: Circle system utilized Preoxygenation: Pre-oxygenation with 100% oxygen Induction Type: IV induction LMA: LMA inserted LMA Size: 4.0 Number of attempts: 1 Placement Confirmation: positive ETCO2 and breath sounds checked- equal and bilateral Tube secured with: Tape Dental Injury: Teeth and Oropharynx as per pre-operative assessment

## 2018-03-08 NOTE — H&P (Signed)
Urology Preoperative H&P   Chief Complaint: Left ureteral stone  History of Present Illness: Kendra [name] is a 48 y.o. female with a history of kidney stones.  She was recently treated via ESWL for a 4 mm left UVJ, but has failed to pass but only a few small stone fragments and has residual, intermittent episodes of left renal colic.  She denies N/V/F/C or interval UTIs.  Multiple KUBs in the office were inconclusive as to the persistence of her left ureteral stone, but she did have mild hydronephrosis on RUS.    Past Medical History:  Diagnosis Date  . Allergy   . Chronic headache   . Depression   . Former smoker    quit in 56  . History of kidney stones   . Hyperlipidemia   . Left bundle branch block   . Obesity   . Systolic dysfunction, left ventricle    EF is 43% per Myoview; dyssynchrony of the septum noted on echo; has LBBB    Past Surgical History:  Procedure Laterality Date  . CHOLECYSTECTOMY  2009  . CYSTOSCOPY WITH RETROGRADE PYELOGRAM, URETEROSCOPY AND STENT PLACEMENT  03/06/2012   Procedure: CYSTOSCOPY WITH RETROGRADE PYELOGRAM, URETEROSCOPY AND STENT PLACEMENT;  Surgeon: Molli Hazard, MD;  Location: [facility];  Service: Urology;  Laterality: Left;  . CYSTOSCOPY WITH RETROGRADE PYELOGRAM, URETEROSCOPY AND STENT PLACEMENT Left 03/20/2012   Procedure: CYSTOSCOPY WITH RETROGRADE PYELOGRAM, URETEROSCOPY AND STENT PLACEMENT;  Surgeon: Molli Hazard, MD;  Location: [facility];  Service: Urology;  Laterality: Left;  balloon dilitation  . EXTRACORPOREAL SHOCK WAVE LITHOTRIPSY Left 01/24/2018   Procedure: EXTRACORPOREAL SHOCK WAVE LITHOTRIPSY (ESWL);  Surgeon: Alexis Frock, MD;  Location[facility];  Service: Urology;  Laterality: Left;  . FOOT SURGERY  12/2005  . HOLMIUM LASER APPLICATION Left 07/16/735   Procedure: HOLMIUM LASER APPLICATION;  Surgeon: Molli Hazard, MD;  Location: [facility];  Service:  Urology;  Laterality: Left;  . TONSILLECTOMY  03/2010  . WRIST SURGERY  11/2001    Allergies:  Allergies  Allergen Reactions  . Codeine Nausea And Vomiting  . Penicillins Hives    Did it involve swelling of the face/tongue/throat, SOB, or low BP? No Did it involve sudden or severe rash/hives, skin peeling, or any reaction on the inside of your mouth or nose? No Did you need to seek medical attention at a hospital or doctor's office? No When did it last happen?Childhood allergy If all above answers are "NO", may proceed with cephalosporin use.     Family History  Problem Relation Age of Onset  . Hyperlipidemia Mother   . Hyperlipidemia Brother   . Hyperlipidemia Brother   . Colon cancer Neg Hx     Social History:  reports that she quit smoking about 27 years ago. She has never used smokeless tobacco. She reports current alcohol use. She reports that she does not use drugs.  ROS: A complete review of systems was performed.  All systems are negative except for pertinent findings as noted.  Physical Exam:  Vital signs in last 24 hours: Temp:  [99 F (37.2 C)] 99 F (37.2 C) (02/07 1123) Pulse Rate:  [74] 74 (02/07 1123) Resp:  [18] 18 (02/07 1123) BP: (139)/(89) 139/89 (02/07 1123) SpO2:  [100 %] 100 % (02/07 1123) Constitutional:  Alert and oriented, No acute distress Cardiovascular: Regular rate and rhythm, No JVD Respiratory: Normal respiratory effort, Lungs clear bilaterally GI: Abdomen is soft,  nontender, nondistended, no abdominal masses GU: No CVA tenderness Lymphatic: No lymphadenopathy Neurologic: Grossly intact, no focal deficits Psychiatric: Normal mood and affect  Laboratory Data:  Recent Labs    03/07/18 0911  WBC 6.9  HGB 13.4  HCT 42.4  PLT 275    Recent Labs    03/07/18 0911  NA 140  K 4.1  CL 108  GLUCOSE 104*  BUN 12  CALCIUM 9.3  CREATININE 0.77     No results found for this or any previous visit (from the past 24  hour(s)). No results found for this or any previous visit (from the past 240 hour(s)).  Renal Function: Recent Labs    03/07/18 0911  CREATININE 0.77   Estimated Creatinine Clearance: 115.4 mL/min (by C-G formula based on SCr of 0.77 mg/dL).  Radiologic Imaging: No results found.  I independently reviewed the above imaging studies.  Assessment and Plan Kendra [name] is a 48 y.o. female with left ureteral stone with renal colic   The risks, benefits and alternatives of cystoscopy with LEFT ureteroscopy, laser lithotripsy and ureteral stent placement was discussed the patient.  Risks included, but are not limited to: bleeding, urinary tract infection, ureteral injury/avulsion, ureteral stricture formation, retained stone fragments, the possibility that multiple surgeries may be required to treat the stone(s), MI, stroke, PE and the inherent risks of general anesthesia.  The patient voices understanding and wishes to proceed.      Ellison Hughs, MD 03/08/2018, 11:52 AM  [facility] Pager: [phone]

## 2018-03-08 NOTE — Anesthesia Postprocedure Evaluation (Signed)
Anesthesia Post Note  Patient: Kendra [name]  Procedure(s) Performed: CYSTOSCOPY/RETROGRADE/URETEROSCOPY/STENT PLACEMENT (Left Ureter)     Patient location during evaluation: PACU Anesthesia Type: General Level of consciousness: awake and alert Pain management: pain level controlled Vital Signs Assessment: post-procedure vital signs reviewed and stable Respiratory status: spontaneous breathing, nonlabored ventilation and respiratory function stable Cardiovascular status: blood pressure returned to baseline and stable Postop Assessment: no apparent nausea or vomiting Anesthetic complications: no    Last Vitals:  Vitals:   03/08/18 1430 03/08/18 1445  BP: (!) 136/93 128/88  Pulse: 89 81  Resp: 16 13  Temp:  36.7 C  SpO2: 100% 100%    Last Pain:  Vitals:   03/08/18 1430  TempSrc:   PainSc: 2                  Audry Pili

## 2018-03-09 ENCOUNTER — Encounter (HOSPITAL_COMMUNITY): Payer: Self-pay | Admitting: Urology

## 2018-03-13 DIAGNOSIS — N83202 Unspecified ovarian cyst, left side: Secondary | ICD-10-CM | POA: Diagnosis not present

## 2018-07-01 DIAGNOSIS — M12571 Traumatic arthropathy, right ankle and foot: Secondary | ICD-10-CM | POA: Diagnosis not present

## 2018-07-01 DIAGNOSIS — M67472 Ganglion, left ankle and foot: Secondary | ICD-10-CM | POA: Diagnosis not present

## 2018-07-01 DIAGNOSIS — M7741 Metatarsalgia, right foot: Secondary | ICD-10-CM | POA: Diagnosis not present

## 2018-07-01 DIAGNOSIS — M722 Plantar fascial fibromatosis: Secondary | ICD-10-CM | POA: Diagnosis not present

## 2018-07-01 DIAGNOSIS — M2042 Other hammer toe(s) (acquired), left foot: Secondary | ICD-10-CM | POA: Diagnosis not present

## 2018-07-01 DIAGNOSIS — M7742 Metatarsalgia, left foot: Secondary | ICD-10-CM | POA: Diagnosis not present

## 2018-07-02 ENCOUNTER — Other Ambulatory Visit: Payer: Self-pay | Admitting: Cardiovascular Disease

## 2018-07-02 DIAGNOSIS — I5042 Chronic combined systolic (congestive) and diastolic (congestive) heart failure: Secondary | ICD-10-CM

## 2018-07-17 DIAGNOSIS — I5022 Chronic systolic (congestive) heart failure: Secondary | ICD-10-CM

## 2018-07-17 DIAGNOSIS — I447 Left bundle-branch block, unspecified: Secondary | ICD-10-CM

## 2018-07-25 IMAGING — CT CT RENAL STONE PROTOCOL
2 of 4 series · 16 of 46 positions shown, 18 images · non-contrast
Comparison: 03/15/2011

CLINICAL DATA: Right flank pain, hematuria

EXAM:
CT ABDOMEN AND PELVIS WITHOUT CONTRAST
TECHNIQUE: Multidetector CT imaging of the abdomen and pelvis was performed
following the standard protocol without IV contrast.

[Series 2: axial st · axial · 0.98mm/px · z∈[-450,-0]mm · 13 of 102 slices shown, 15 images]
[im 8/102  soft-tissue]
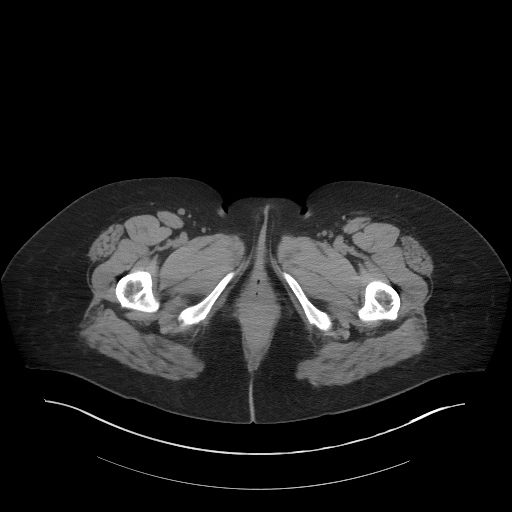
[im 8/102  bone]
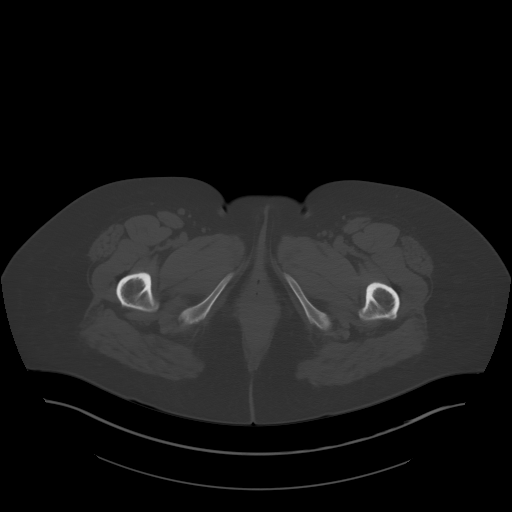
[im 15/102  soft-tissue]
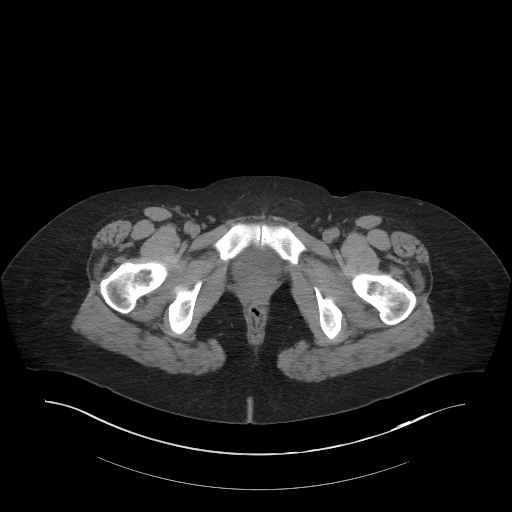
[im 23/102  soft-tissue]
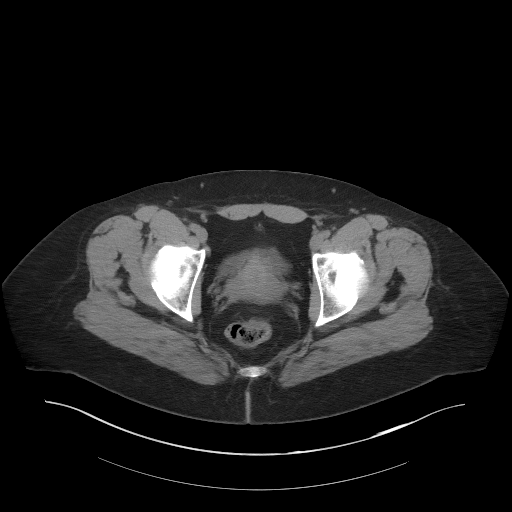
[im 30/102  soft-tissue]
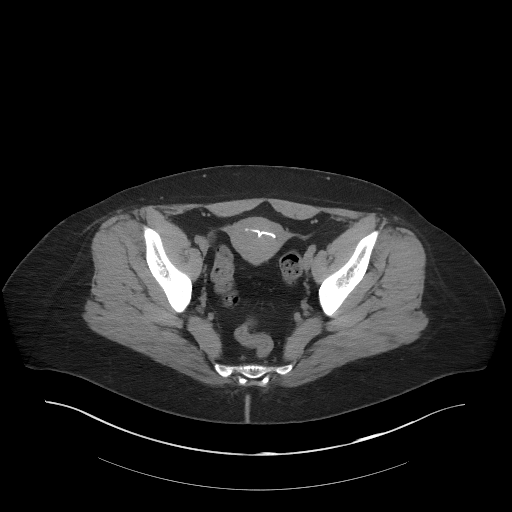
[im 38/102  soft-tissue]
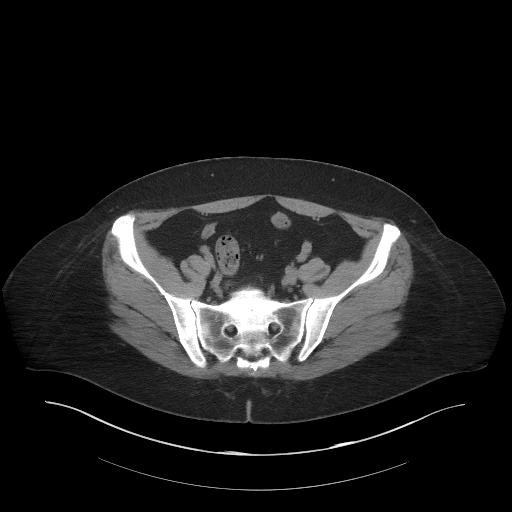
[im 45/102  soft-tissue]
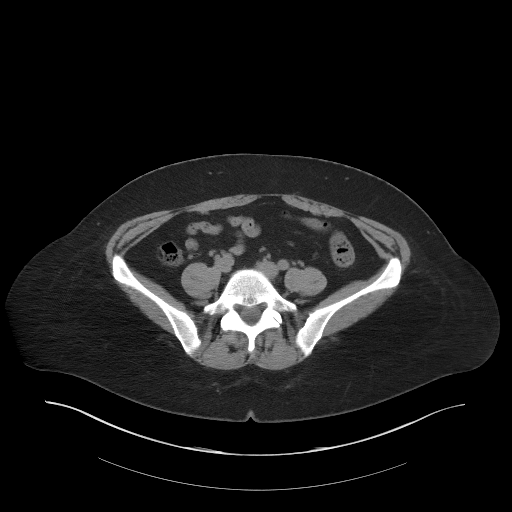
[im 53/102  soft-tissue]
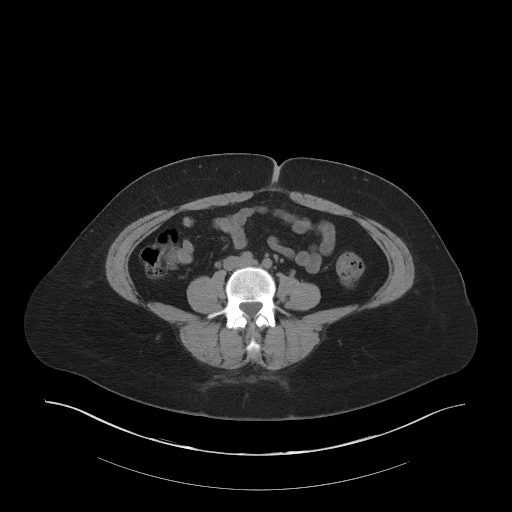
[im 60/102  soft-tissue]
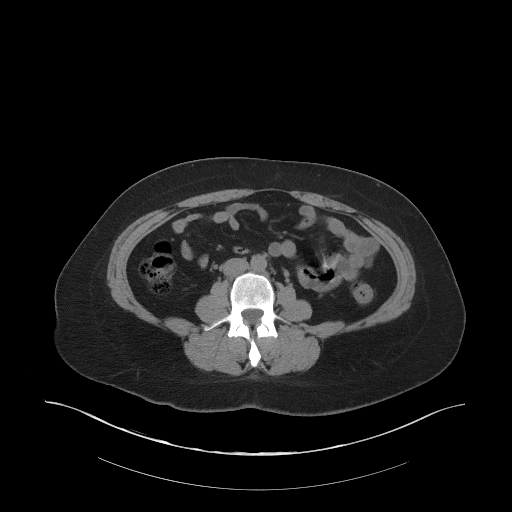
[im 68/102  soft-tissue]
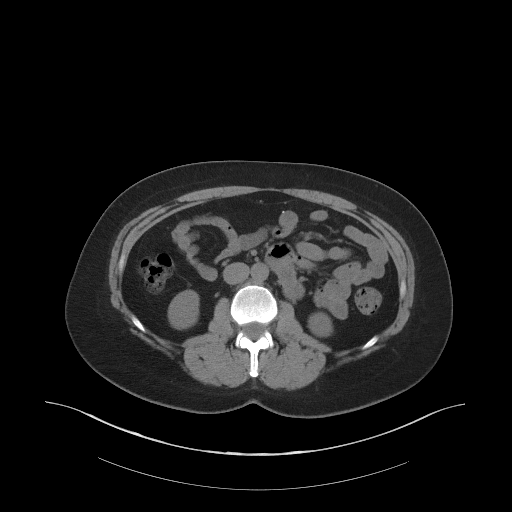
[im 68/102  bone]
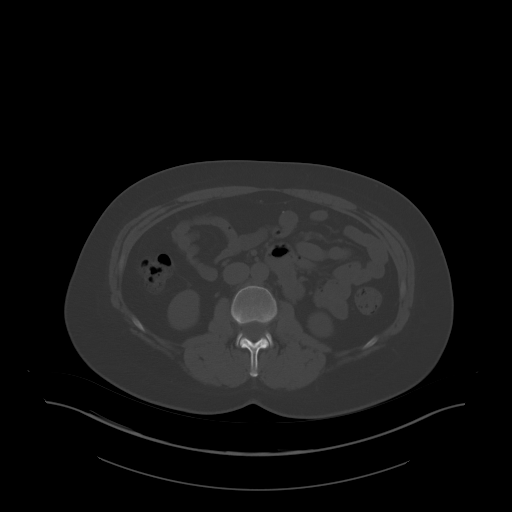
[im 75/102  soft-tissue]
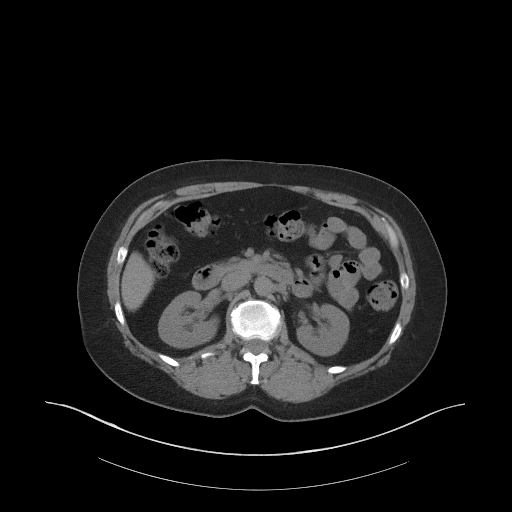
[im 83/102  soft-tissue]
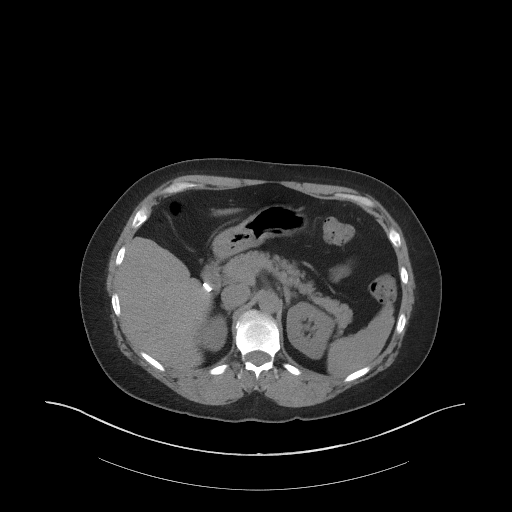
[im 90/102  soft-tissue]
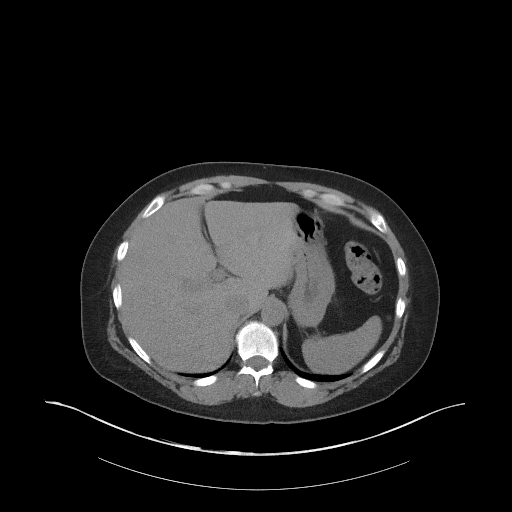
[im 98/102  soft-tissue]
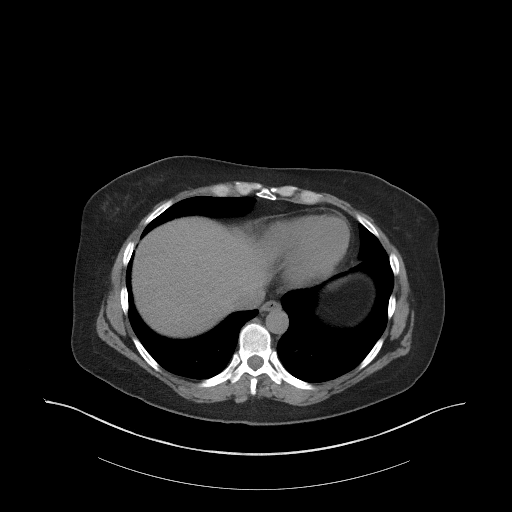

[Series 4: coronal st · coronal · 0.75mm/px · 3 of 86 slices shown]
[im 29/86  soft-tissue]
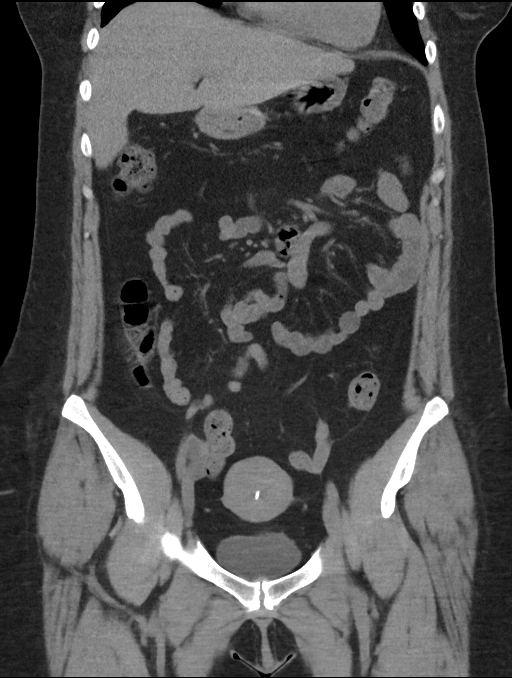
[im 38/86  soft-tissue]
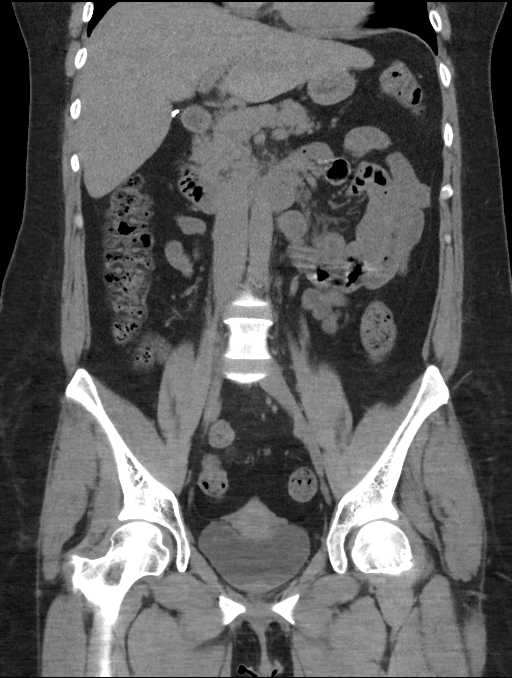
[im 48/86  soft-tissue]
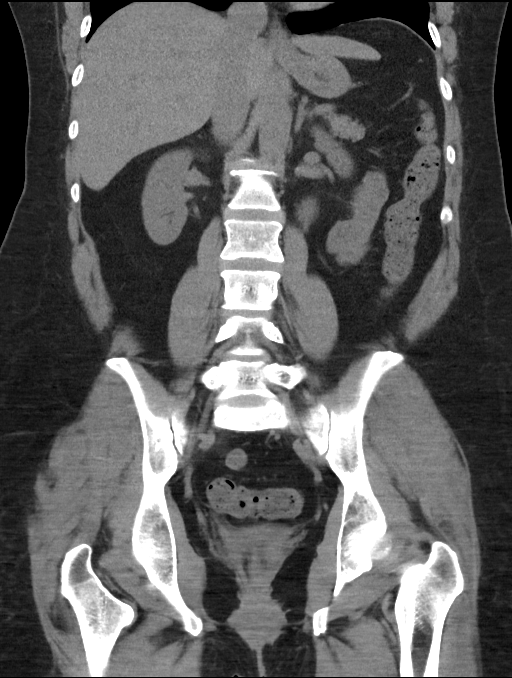

[16 of 46 positions shown; findings below may reference images not displayed]

FINDINGS: Lower chest: Lung bases are clear.

Hepatobiliary: Unenhanced liver is unremarkable.

Status post cholecystectomy. No intrahepatic or extrahepatic ductal
dilatation.

Pancreas: Within normal limits.

Spleen: Within normal limits.

Adrenals/Urinary Tract: Adrenal glands are within normal limits.

4 mm nonobstructing right lower pole renal calculus (series 2/ image
33). 5 mm nonobstructing interpolar left renal calculus (series 2/
image 25).

No ureteral or bladder calculi.  No hydronephrosis.

Bladder is within normal limits.

Stomach/Bowel: Stomach is within normal limits.

No evidence of bowel obstruction.

Normal appendix (series 2/ image 52).

Vascular/Lymphatic: No evidence of abdominal aortic aneurysm.

No suspicious abdominopelvic lymphadenopathy.

Reproductive: Uterus is notable for IUD in satisfactory position.

Bilateral ovaries are within normal limits.

Other: No abdominopelvic ascites.

Musculoskeletal: Mild degenerative changes at T12-L1.
IMPRESSION: Bilateral nonobstructing renal calculi measuring up to 5 mm. No
ureteral or bladder calculi. No hydronephrosis.

IUD in satisfactory position.

No evidence of bowel obstruction.  Normal appendix.

## 2018-07-29 NOTE — Telephone Encounter (Signed)
I would like to repeat her echo and have her come in for an office visit in the next several weeks or so .     Mertie Moores, MD  07/29/2018 10:29 AM    [facility] [place],  [place], [place]  [address] Pager [phone] Phone: [phone]; Fax: [phone]

## 2018-07-30 ENCOUNTER — Other Ambulatory Visit: Payer: Self-pay | Admitting: Nurse Practitioner

## 2018-07-30 ENCOUNTER — Telehealth: Payer: Self-pay

## 2018-07-30 MED ORDER — ENTRESTO 49-51 MG PO TABS: 1.0000 | ORAL_TABLET | Freq: Two times a day (BID) | ORAL | 3 refills | Status: DC

## 2018-07-30 NOTE — Telephone Encounter (Signed)
The pt states that her Kendra [name] has gone up to $540.  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 I called [facility] to see if the card brought the pts cost down to $10 for a 30 month supply and was advised by Roseanne that the card was successful. She ran the RX while we were on the phone and verified that the cost will be $10/30 day supply.  I called the pt and made her aware. She expressed much appreciation and states that she will continue to get all of her other meds from her mail pharmacy but will get her Entresto monthly from [facility].

## 2018-08-01 ENCOUNTER — Other Ambulatory Visit: Payer: Self-pay | Admitting: Cardiovascular Disease

## 2018-08-08 ENCOUNTER — Other Ambulatory Visit: Payer: Self-pay

## 2018-08-08 ENCOUNTER — Ambulatory Visit (HOSPITAL_COMMUNITY): Payer: BC Managed Care – PPO | Attending: Cardiovascular Disease

## 2018-08-08 DIAGNOSIS — I447 Left bundle-branch block, unspecified: Secondary | ICD-10-CM

## 2018-08-08 DIAGNOSIS — I5022 Chronic systolic (congestive) heart failure: Secondary | ICD-10-CM | POA: Diagnosis not present

## 2018-09-02 ENCOUNTER — Telehealth: Payer: Self-pay

## 2018-09-02 NOTE — Telephone Encounter (Signed)

## 2018-09-03 ENCOUNTER — Encounter: Payer: Self-pay | Admitting: Cardiovascular Disease

## 2018-09-03 ENCOUNTER — Telehealth (INDEPENDENT_AMBULATORY_CARE_PROVIDER_SITE_OTHER): Payer: BC Managed Care – PPO | Admitting: Cardiovascular Disease

## 2018-09-03 ENCOUNTER — Other Ambulatory Visit: Payer: Self-pay

## 2018-09-03 VITALS — HR 75 | Ht 70.0 in | Wt 229.0 lb

## 2018-09-03 DIAGNOSIS — I5022 Chronic systolic (congestive) heart failure: Secondary | ICD-10-CM | POA: Diagnosis not present

## 2018-09-03 DIAGNOSIS — Z7189 Other specified counseling: Secondary | ICD-10-CM

## 2018-09-03 DIAGNOSIS — E782 Mixed hyperlipidemia: Secondary | ICD-10-CM | POA: Diagnosis not present

## 2018-09-03 DIAGNOSIS — I447 Left bundle-branch block, unspecified: Secondary | ICD-10-CM

## 2018-09-03 DIAGNOSIS — E785 Hyperlipidemia, unspecified: Secondary | ICD-10-CM

## 2018-09-03 NOTE — Progress Notes (Signed)
Virtual Visit via Telephone  Note   This visit type was conducted due to national recommendations for restrictions regarding the COVID-19 Pandemic (e.g. social distancing) in an effort to limit this patient's exposure and mitigate transmission in our community.  Due to her co-morbid illnesses, this patient is at least at moderate risk for complications without adequate follow up.  This format is felt to be most appropriate for this patient at this time.  All issues noted in this document were discussed and addressed.  A limited physical exam was performed with this format.  Please refer to the patient's chart for her consent to telehealth for Kendra [name].   Date:  09/03/2018   ID:  Kendra [name], DOB 1970-06-08, MRN [id number]  Patient Location: Home Provider Location: Home  PCP:  Tower, Wynelle Fanny, MD  Cardiologist:  Mertie Moores, MD  Electrophysiologist:  None   Problem List: 1. Left bundle branch block 2. Chest pain 3. Kidney stone 4. Palpitations  5. Chronic  systolic CHF - in the setting of LBBB.  EF 43% by myoview in April  2013.  EF 35% by echo Feb. 2019.      Kendra [name]  is a 48 y.o. female. She presented to her medical Dr. with some upper left-sided abdominal pain/breast pain.  This pain is occasionally pleuritic. She was thought to have a pulmonary illness. A d-dimer was subsequent on and found to be negative the An EKG showed left bundle branch block.   A subsequent echocardiogram revealed dyssynchrony of her septum.  She does not exercise on regular basis. She walks occasionally. She's been in the process of moving. She has noticed that she's a little bit more short of breath moving boxes. She's also noticed some left arm tingling and numbness which radiates down to her fingers.  We performed a Lexiscan Myoview which revealed an EF of 43%.  Overall Impression: Low risk stress nuclear study. No ischemia or infarct. Baseline ECG LBBB with decreased EF  LV Ejection  Fraction: 43%. LV Wall Motion: Apical hypokinesis  We initially had her on Toprol-XL but she did not tolerate it. She felt very lethargic. We now have her on carvedilol which he seems to tolerate much better.  She's exercising on occasion.  She has been measuring her BP on occasion.  Her readings have been ok but several have been elevated.  Feb. 27, 2014:  She has had several kidney surgeries since I last saw her.  She had some chest pressure when she woke up for anesthesia. She has had some bad indigestion since then.  Better with antiacids.  She has not been exercising much for the past month.  Her BP has been well controlled.    Nov. 10, 2014:  She is doing well. Having some PVCs on occasion.  She has been walking some.  She has had occaisonal ankle swelling.    No edema today.  She has a new job.   April 22, 2013:    Kendra [name]  presents today for further evaluation of palpitations. She's been under a little bit more stress than usual. She's also been fatigued.   We placed a Holter monitor. She had sinus tachycardia as well as sinus bradycardia. She has an underlying bundle branch block. There were no arrhythmias to suggest SVT   July 07, 2013:  Kendra [name] is doing well.  Able to do all of her normal activities without problems. Trying to exercise.  She has not had many palpitations.  Sleeping  better.    Wants to delay getting the sleep study.  July 28, 2014:  Doing great . Not many palpitations  Exercising  - walking several times a week .   Doing great.  Has lost 8 lbs.   Sept. 1, 2017:  Doing well No CP , no dyspnea Walking ,  Has lost 9 lbs since her last visit .   February 06, 2017:  Feeling well. Has gained some weight.    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     Feb. 5, 2019:  Kendra [name] is seen back for her chronic systolic congestive heart failure, left bundle branch block. Echocardiogram from January 21 reveals reduced left  ventricular systolic function with an EF of 35%.   We added Entresto 24-26 mg twice a day. Has not been exercising   Feb. 28, 2019 Seen with husband, Kendra [name] Has had some low BP readings  Since adding Entresto  Has had 2-3 episodes of lightheadedness -  Starting to exercise but she has lots of DOE .  Has noticed some symptoms with exercise since she is short of breath .  May 08, 2017:  Kendra [name] is seen back for eval of her chf and LBBB  Started on Entresto 49-51  - tolerating it well Had a myoview - which revealed septal defect - thought to be due to LBBB .   Coronary CT angiogram was normal  Still having some DOE  Climbing stairs  No near syncope ,   Tolerating meds well  Jun 15, 2017:   Kendra [name] is seen today for follow-up of her chronic systolic congestive heart failure. We increased her carvedilol to 6.25 mg twice a day at her last visit. Wt = 227,  Tolerating the coreg well  Still has some shortness of breath   Evaluation Performed:  Follow-Up Visit  Chief Complaint:  CHF   Aug. 4, 2020    Kendra [name] is a 48 y.o. female with hx of CHF.    Seen for 1 year visit Tolerating meds.  No BP available today.   BP has been good.  Not exercising as much as she would like.  Recent echo shows EF of 50-55%. Still has dysynchrony from the LBBB  Wt today is 229.   The patient does not have symptoms concerning for COVID-19 infection (fever, chills, cough, or new shortness of breath).    Past Medical History:  Diagnosis Date  . Allergy   . Chronic headache   . Depression   . Former smoker    quit in 5  . History of kidney stones   . Hyperlipidemia   . Left bundle branch block   . Obesity   . Systolic dysfunction, left ventricle    EF is 43% per Myoview; dyssynchrony of the septum noted on echo; has LBBB   Past Surgical History:  Procedure Laterality Date  . CHOLECYSTECTOMY  2009  . CYSTOSCOPY WITH RETROGRADE PYELOGRAM, URETEROSCOPY AND STENT PLACEMENT   03/06/2012   Procedure: CYSTOSCOPY WITH RETROGRADE PYELOGRAM, URETEROSCOPY AND STENT PLACEMENT;  Surgeon: Molli Hazard, MD;  [facility];  Service: Urology;  Laterality: Left;  . CYSTOSCOPY WITH RETROGRADE PYELOGRAM, URETEROSCOPY AND STENT PLACEMENT Left 03/20/2012   Procedure: CYSTOSCOPY WITH RETROGRADE PYELOGRAM, URETEROSCOPY AND STENT PLACEMENT;  Surgeon: Molli Hazard, MD;  Location: [facility];  Service: Urology;  Laterality: Left;  balloon dilitation  . CYSTOSCOPY/URETEROSCOPY/HOLMIUM LASER/STENT PLACEMENT  Left 03/08/2018   Procedure: CYSTOSCOPY/RETROGRADE/URETEROSCOPY/STENT PLACEMENT;  Surgeon: Ceasar Mons, MD;  Location: [facility];  Service: Urology;  Laterality: Left;  . EXTRACORPOREAL SHOCK WAVE LITHOTRIPSY Left 01/24/2018   Procedure: EXTRACORPOREAL SHOCK WAVE LITHOTRIPSY (ESWL);  Surgeon: Alexis Frock, MD;  Location: [facility];  Service: Urology;  Laterality: Left;  . FOOT SURGERY  12/2005  . HOLMIUM LASER APPLICATION Left 3/53/6144   Procedure: HOLMIUM LASER APPLICATION;  Surgeon: Molli Hazard, MD;  Location[facility];  Service: Urology;  Laterality: Left;  . TONSILLECTOMY  03/2010  . WRIST SURGERY  11/2001     Current Meds  Medication Sig  . carvedilol (COREG) 6.25 MG tablet Take 1 tablet (6.25 mg total) by mouth 2 (two) times daily. Please schedule an appt 1st attempt  . ibuprofen (ADVIL,MOTRIN) 200 MG tablet Take 800 mg by mouth daily as needed for moderate pain.  . Levonorgestrel (LILETTA, 52 MG,) 19.5 MCG/DAY IUD IUD Liletta 19.5 mcg/24 hrs (5 yrs) 52 mg intrauterine device  Take 1 device by intrauterine route.  . rosuvastatin (CRESTOR) 10 MG tablet Take 1 tablet (10 mg total) by mouth daily. Please make annual appt with Dr. Acie Fredrickson for future refills. Thank you. 1st attempt.  . sacubitril-valsartan (ENTRESTO) 49-51 MG Take 1 tablet by mouth 2 (two) times daily.  Marland Kitchen spironolactone  (ALDACTONE) 25 MG tablet Take 1 tablet (25 mg total) by mouth daily. Please schedule an appt 1st attempt  . vitamin B-12 (CYANOCOBALAMIN) 500 MCG tablet Take 500 mcg by mouth daily.     Allergies:   Codeine and Penicillins   Social History   Tobacco Use  . Smoking status: Former Smoker    Quit date: 01/31/1991    Years since quitting: 27.6  . Smokeless tobacco: Never Used  Substance Use Topics  . Alcohol use: Yes    Comment: occ  . Drug use: No     Family Hx: The patient's family history includes Hyperlipidemia in her brother, brother, and mother. There is no history of Colon cancer.  ROS:   Please see the history of present illness.     All other systems reviewed and are negative.   Prior CV studies:   The following studies were reviewed today:    Labs/Other Tests and Data Reviewed:    EKG:  No ECG reviewed.  Recent Labs: 12/07/2017: ALT 22 03/07/2018: BUN 12; Creatinine, Ser 0.77; Hemoglobin 13.4; Platelets 275; Potassium 4.1; Sodium 140   Recent Lipid Panel Lab Results  Component Value Date/Time   CHOL 155 06/26/2017 07:40 AM   TRIG 84 06/26/2017 07:40 AM   HDL 60 06/26/2017 07:40 AM   CHOLHDL 2.6 06/26/2017 07:40 AM   CHOLHDL 4 08/07/2013 08:49 AM   LDLCALC 78 06/26/2017 07:40 AM   LDLDIRECT 147.9 02/27/2012 09:47 AM    Wt Readings from Last 3 Encounters:  09/03/18 229 lb (103.9 kg)  03/07/18 232 lb (105.2 kg)  01/24/18 230 lb (104.3 kg)     Objective:    Vital Signs:  Pulse 75   Ht 5\' 10"  (1.778 m)   Wt 229 lb (103.9 kg)   BMI 32.86 kg/m    No exam available telehealth visit  Her power is out from [place].  Phone visit   ASSESSMENT & PLAN:    1. Chronic combined systolic and diastolic congestive heart failure: Overall Kendra [name] is doing very well.  She is tolerating her medications without any problems.  Continue current medications.  Labs from February, 2020 look  good.  Has a LBBB.   We discussed the possible need for CRT at some point.   2.   Obesity: She still is carrying a bit too much weight.  We discussed the importance of weight loss.  She is not exercising as much as she would like.  She will continue to work on this.  3.  Hyperlipidemia:   On crestor,  Labs look great.  Will recheck at next office visit    COVID-19 Education: The signs and symptoms of COVID-19 were discussed with the patient and how to seek care for testing (follow up with PCP or arrange E-visit).  The importance of social distancing was discussed today.  Time:   Today, I have spent  18  minutes with the patient with telehealth technology discussing the above problems.     Medication Adjustments/Labs and Tests Ordered: Current medicines are reviewed at length with the patient today.  Concerns regarding medicines are outlined above.   Tests Ordered: No orders of the defined types were placed in this encounter.   Medication Changes: No orders of the defined types were placed in this encounter.   Follow Up:  In Person in 6 month(s)  Signed, Mertie Moores, MD  09/03/2018 8:36 AM    [place]

## 2018-09-03 NOTE — Patient Instructions (Signed)

## 2018-09-16 DIAGNOSIS — M71572 Other bursitis, not elsewhere classified, left ankle and foot: Secondary | ICD-10-CM | POA: Diagnosis not present

## 2018-09-16 DIAGNOSIS — M722 Plantar fascial fibromatosis: Secondary | ICD-10-CM | POA: Diagnosis not present

## 2018-09-30 ENCOUNTER — Other Ambulatory Visit: Payer: Self-pay | Admitting: Cardiovascular Disease

## 2018-09-30 DIAGNOSIS — I5042 Chronic combined systolic (congestive) and diastolic (congestive) heart failure: Secondary | ICD-10-CM

## 2018-10-25 ENCOUNTER — Other Ambulatory Visit: Payer: Self-pay | Admitting: Cardiovascular Disease

## 2018-12-03 DIAGNOSIS — Z1159 Encounter for screening for other viral diseases: Secondary | ICD-10-CM | POA: Diagnosis not present

## 2018-12-03 DIAGNOSIS — Z20828 Contact with and (suspected) exposure to other viral communicable diseases: Secondary | ICD-10-CM | POA: Diagnosis not present

## 2018-12-16 DIAGNOSIS — Z20828 Contact with and (suspected) exposure to other viral communicable diseases: Secondary | ICD-10-CM | POA: Diagnosis not present

## 2018-12-16 DIAGNOSIS — Z1159 Encounter for screening for other viral diseases: Secondary | ICD-10-CM | POA: Diagnosis not present

## 2018-12-17 DIAGNOSIS — U071 COVID-19: Secondary | ICD-10-CM | POA: Insufficient documentation

## 2019-02-17 DIAGNOSIS — M7732 Calcaneal spur, left foot: Secondary | ICD-10-CM | POA: Diagnosis not present

## 2019-02-17 DIAGNOSIS — M722 Plantar fascial fibromatosis: Secondary | ICD-10-CM | POA: Diagnosis not present

## 2019-03-11 DIAGNOSIS — Z1151 Encounter for screening for human papillomavirus (HPV): Secondary | ICD-10-CM | POA: Diagnosis not present

## 2019-03-11 DIAGNOSIS — Z01419 Encounter for gynecological examination (general) (routine) without abnormal findings: Secondary | ICD-10-CM | POA: Diagnosis not present

## 2019-03-11 DIAGNOSIS — Z130 Encounter for screening for diseases of the blood and blood-forming organs and certain disorders involving the immune mechanism: Secondary | ICD-10-CM | POA: Diagnosis not present

## 2019-03-11 DIAGNOSIS — K644 Residual hemorrhoidal skin tags: Secondary | ICD-10-CM | POA: Insufficient documentation

## 2019-03-11 DIAGNOSIS — Z124 Encounter for screening for malignant neoplasm of cervix: Secondary | ICD-10-CM | POA: Diagnosis not present

## 2019-04-14 DIAGNOSIS — M25552 Pain in left hip: Secondary | ICD-10-CM | POA: Diagnosis not present

## 2019-05-19 DIAGNOSIS — M722 Plantar fascial fibromatosis: Secondary | ICD-10-CM | POA: Diagnosis not present

## 2019-05-30 ENCOUNTER — Other Ambulatory Visit: Payer: Self-pay | Admitting: Surgery

## 2019-05-30 DIAGNOSIS — R1031 Right lower quadrant pain: Secondary | ICD-10-CM

## 2019-06-04 DIAGNOSIS — M67472 Ganglion, left ankle and foot: Secondary | ICD-10-CM | POA: Diagnosis not present

## 2019-06-04 DIAGNOSIS — M24572 Contracture, left ankle: Secondary | ICD-10-CM | POA: Diagnosis not present

## 2019-06-04 DIAGNOSIS — M722 Plantar fascial fibromatosis: Secondary | ICD-10-CM | POA: Diagnosis not present

## 2019-06-09 DIAGNOSIS — M79672 Pain in left foot: Secondary | ICD-10-CM | POA: Diagnosis not present

## 2019-06-09 DIAGNOSIS — R262 Difficulty in walking, not elsewhere classified: Secondary | ICD-10-CM | POA: Diagnosis not present

## 2019-06-11 DIAGNOSIS — R262 Difficulty in walking, not elsewhere classified: Secondary | ICD-10-CM | POA: Diagnosis not present

## 2019-06-11 DIAGNOSIS — M79672 Pain in left foot: Secondary | ICD-10-CM | POA: Diagnosis not present

## 2019-06-16 DIAGNOSIS — R262 Difficulty in walking, not elsewhere classified: Secondary | ICD-10-CM | POA: Diagnosis not present

## 2019-06-16 DIAGNOSIS — M79672 Pain in left foot: Secondary | ICD-10-CM | POA: Diagnosis not present

## 2019-06-18 DIAGNOSIS — R262 Difficulty in walking, not elsewhere classified: Secondary | ICD-10-CM | POA: Diagnosis not present

## 2019-06-18 DIAGNOSIS — M79672 Pain in left foot: Secondary | ICD-10-CM | POA: Diagnosis not present

## 2019-06-23 ENCOUNTER — Other Ambulatory Visit: Payer: Self-pay

## 2019-06-23 ENCOUNTER — Ambulatory Visit
Admission: RE | Admit: 2019-06-23 | Discharge: 2019-06-23 | Disposition: A | Discharge status: Home / Self Care | Payer: BC Managed Care – PPO | Source: Ambulatory Visit | Attending: Surgery | Admitting: Surgery

## 2019-06-23 DIAGNOSIS — D259 Leiomyoma of uterus, unspecified: Secondary | ICD-10-CM | POA: Diagnosis not present

## 2019-06-23 DIAGNOSIS — R1031 Right lower quadrant pain: Secondary | ICD-10-CM

## 2019-06-23 MED ORDER — GADOBENATE DIMEGLUMINE 529 MG/ML IV SOLN
20.0000 mL | Freq: Once | INTRAVENOUS | Status: AC | PRN
  Administered 2019-06-23: 20 mL via INTRAVENOUS

## 2019-06-24 DIAGNOSIS — M722 Plantar fascial fibromatosis: Secondary | ICD-10-CM | POA: Diagnosis not present

## 2019-06-24 DIAGNOSIS — M2041 Other hammer toe(s) (acquired), right foot: Secondary | ICD-10-CM | POA: Diagnosis not present

## 2019-06-25 NOTE — Progress Notes (Signed)
Please call the patient and let them know that their MRI showed no sign of femoral hernia.  I can see her back if she is still having symptoms, but I don't see anything that needs surgery at this time.

## 2019-08-21 ENCOUNTER — Other Ambulatory Visit: Payer: Self-pay | Admitting: Cardiovascular Disease

## 2019-08-21 DIAGNOSIS — I5042 Chronic combined systolic (congestive) and diastolic (congestive) heart failure: Secondary | ICD-10-CM

## 2019-09-02 DIAGNOSIS — M722 Plantar fascial fibromatosis: Secondary | ICD-10-CM | POA: Diagnosis not present

## 2019-09-02 DIAGNOSIS — G5752 Tarsal tunnel syndrome, left lower limb: Secondary | ICD-10-CM | POA: Diagnosis not present

## 2019-09-02 DIAGNOSIS — S92002A Unspecified fracture of left calcaneus, initial encounter for closed fracture: Secondary | ICD-10-CM | POA: Diagnosis not present

## 2019-09-12 DIAGNOSIS — J209 Acute bronchitis, unspecified: Secondary | ICD-10-CM | POA: Diagnosis not present

## 2019-09-20 ENCOUNTER — Other Ambulatory Visit: Payer: Self-pay | Admitting: Cardiovascular Disease

## 2019-09-20 DIAGNOSIS — R05 Cough: Secondary | ICD-10-CM | POA: Diagnosis not present

## 2019-09-20 DIAGNOSIS — I5042 Chronic combined systolic (congestive) and diastolic (congestive) heart failure: Secondary | ICD-10-CM

## 2019-09-20 DIAGNOSIS — R062 Wheezing: Secondary | ICD-10-CM | POA: Diagnosis not present

## 2019-09-20 DIAGNOSIS — J069 Acute upper respiratory infection, unspecified: Secondary | ICD-10-CM | POA: Diagnosis not present

## 2019-10-07 ENCOUNTER — Telehealth: Payer: Self-pay | Admitting: Gastroenterology

## 2019-10-08 NOTE — Telephone Encounter (Signed)
The pt states she has pain in the rectum and believes she has an internal hemorrhoid.  She has not been seen since 2018.  She was scheduled to see Colleen on 10/13.  However, she was advised to follow up with PCP or urgent care if the pain worsens.  The pt has been advised of the information and verbalized understanding.

## 2019-10-14 ENCOUNTER — Ambulatory Visit (INDEPENDENT_AMBULATORY_CARE_PROVIDER_SITE_OTHER): Payer: BC Managed Care – PPO | Admitting: Cardiovascular Disease

## 2019-10-14 ENCOUNTER — Other Ambulatory Visit: Payer: Self-pay

## 2019-10-14 ENCOUNTER — Encounter: Payer: Self-pay | Admitting: Cardiovascular Disease

## 2019-10-14 VITALS — BP 108/72 | HR 80 | Ht 70.0 in | Wt 235.0 lb

## 2019-10-14 DIAGNOSIS — I447 Left bundle-branch block, unspecified: Secondary | ICD-10-CM

## 2019-10-14 DIAGNOSIS — E785 Hyperlipidemia, unspecified: Secondary | ICD-10-CM | POA: Diagnosis not present

## 2019-10-14 DIAGNOSIS — E782 Mixed hyperlipidemia: Secondary | ICD-10-CM | POA: Diagnosis not present

## 2019-10-14 DIAGNOSIS — I5022 Chronic systolic (congestive) heart failure: Secondary | ICD-10-CM | POA: Diagnosis not present

## 2019-10-14 LAB — HEPATIC FUNCTION PANEL
ALT: 40 IU/L — ABNORMAL HIGH (ref 0–32)
AST: 24 IU/L (ref 0–40)
Albumin: 4.2 g/dL (ref 3.8–4.8)
Alkaline Phosphatase: 69 IU/L (ref 44–121)
Bilirubin Total: 0.5 mg/dL (ref 0.0–1.2)
Bilirubin, Direct: 0.14 mg/dL (ref 0.00–0.40)
Total Protein: 6.6 g/dL (ref 6.0–8.5)

## 2019-10-14 LAB — BASIC METABOLIC PANEL
BUN/Creatinine Ratio: 22 (ref 9–23)
BUN: 19 mg/dL (ref 6–24)
CO2: 24 mmol/L (ref 20–29)
Calcium: 9.7 mg/dL (ref 8.7–10.2)
Chloride: 105 mmol/L (ref 96–106)
Creatinine, Ser: 0.87 mg/dL (ref 0.57–1.00)
GFR calc Af Amer: 91 mL/min/{1.73_m2} (ref >59)
GFR calc non Af Amer: 79 mL/min/{1.73_m2} (ref >59)
Glucose: 100 mg/dL — ABNORMAL HIGH (ref 65–99)
Potassium: 5.2 mmol/L (ref 3.5–5.2)
Sodium: 141 mmol/L (ref 134–144)

## 2019-10-14 LAB — LIPID PANEL
Chol/HDL Ratio: 3.3 ratio (ref 0.0–4.4)
Cholesterol, Total: 201 mg/dL — ABNORMAL HIGH (ref 100–199)
HDL: 61 mg/dL (ref >39)
LDL Chol Calc (NIH): 119 mg/dL — ABNORMAL HIGH (ref 0–99)
Triglycerides: 116 mg/dL (ref 0–149)
VLDL Cholesterol Cal: 21 mg/dL (ref 5–40)

## 2019-10-14 NOTE — Patient Instructions (Addendum)
Vit D  5000 iu Vit K2 150 mcg a day Zinc 50 mg a day Vit C 1000 mg 1-2 times a day   Medication Instructions:  Your physician recommends that you continue on your current medications as directed. Please refer to the Current Medication list given to you today.  *If you need a refill on your cardiac medications before your next appointment, please call your pharmacy*   Lab Work: TODAY - cholesterol, liver panel, basic metabolic panel If you have labs (blood work) drawn today and your tests are completely normal, you will receive your results only by: Marland Kitchen MyChart Message (if you have MyChart) OR . A paper copy in the mail If you have any lab test that is abnormal or we need to change your treatment, we will call you to review the results.    Testing/Procedures: None Ordered    Follow-Up: At [facility], you and your health needs are our priority.  As part of our continuing mission to provide you with exceptional heart care, we have created designated Provider Care Teams.  These Care Teams include your primary Cardiologist (physician) and Advanced Practice Providers (APPs -  Physician Assistants and Nurse Practitioners) who all work together to provide you with the care you need, when you need it.   Your next appointment:   1 year(s)  The format for your next appointment:   In Person  Provider:   You may see Mertie Moores, MD or one of the following Advanced Practice Providers on your designated Care Team:    Richardson Dopp, PA-C  [place], [place]

## 2019-10-14 NOTE — Progress Notes (Signed)
Kendra [name] Date of Birth  10-02-1970 Kendra [name]        [address]    [place]     [place], [place]  [address]      [phone]  Fax  [phone]       Problem List: 1. Left bundle branch block 2. Chest pain 3. Kidney stone 4. Palpitations  5. Chronic  systolic CHF - in the setting of LBBB.  EF 43% by myoview in April  2013.  EF 35% by echo Feb. 2019.      Kendra [name]  is a 49 y.o. female. She presented to her medical Dr. with some upper left-sided abdominal pain/breast pain.  This pain is occasionally pleuritic. She was thought to have a pulmonary illness. A d-dimer was subsequent on and found to be negative the An EKG showed left bundle branch block.   A subsequent echocardiogram revealed dyssynchrony of her septum.  She does not exercise on regular basis. She walks occasionally. She's been in the process of moving. She has noticed that she's a little bit more short of breath moving boxes. She's also noticed some left arm tingling and numbness which radiates down to her fingers.  We performed a Lexiscan Myoview which revealed an EF of 43%.  Overall Impression: Low risk stress nuclear study. No ischemia or infarct. Baseline ECG LBBB with decreased EF  LV Ejection Fraction: 43%. LV Wall Motion: Apical hypokinesis  We initially had her on Toprol-XL but she did not tolerate it. She felt very lethargic. We now have her on carvedilol which he seems to tolerate much better.  She's exercising on occasion.  She has been measuring her BP on occasion.  Her readings have been ok but several have been elevated.  Feb. 27, 2014:  She has had several kidney surgeries since I last saw her.  She had some chest pressure when she woke up for anesthesia. She has had some bad indigestion since then.  Better with antiacids.  She has not been exercising much for the past month.  Her BP has been well controlled.    Nov. 10, 2014:  She is doing well. Having some PVCs on occasion.   She has been walking some.  She has had occaisonal ankle swelling.    No edema today.  She has a new job.   April 22, 2013:    Kendra [name]  presents today for further evaluation of palpitations. She's been under a little bit more stress than usual. She's also been fatigued.   We placed a Holter monitor. She had sinus tachycardia as well as sinus bradycardia. She has an underlying bundle branch block. There were no arrhythmias to suggest SVT   July 07, 2013:  Kendra [name] is doing well.  Able to do all of her normal activities without problems. Trying to exercise.  She has not had many palpitations.  Sleeping better.    Wants to delay getting the sleep study.  July 28, 2014:  Doing great . Not many palpitations  Exercising  - walking several times a week .   Doing great.  Has lost 8 lbs.   Sept. 1, 2017:  Doing well No CP , no dyspnea Walking ,  Has lost 9 lbs since her last visit .   February 06, 2017:  Feeling well. Has gained some weight.    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  Feb. 5, 2019:  Kendra [name] is seen back for her chronic systolic congestive heart failure, left bundle branch block. Echocardiogram from January 21 reveals reduced left ventricular systolic function with an EF of 35%.   We added Entresto 24-26 mg twice a day. Has not been exercising   Feb. 28, 2019 Seen with husband, Kendra [name] Has had some low BP readings  Since adding Entresto  Has had 2-3 episodes of lightheadedness -  Starting to exercise but she has lots of DOE .  Has noticed some symptoms with exercise since she is short of breath .  May 08, 2017:  Kendra [name] is seen back for eval of her chf and LBBB  Started on Entresto 49-51  - tolerating it well Had a myoview - which revealed septal defect - thought to be due to LBBB .   Coronary CT angiogram was normal  Still having some DOE  Climbing stairs  No near syncope ,   Tolerating meds well  Jun 15, 2017:   Kendra [name] is seen today for  follow-up of her chronic systolic congestive heart failure. We increased her carvedilol to 6.25 mg twice a day at her last visit. Wt = 227,  Tolerating the coreg well  Still has some shortness of breath .   Sept. 14, 2021  Kendra [name] is seen today for follow up of her chf. Had a vidio visit last year Echo from 2020 shows normal EF of 50-55% Some dyssynchrony from LBBB Feeling well Has some indigestion Has had some feet swelling  Had bronchitis several weeks ago , has been on some medrol dose packs   Has not had the covid vaccine yet. Had covid in Nov. 2020    Current Outpatient Medications on File Prior to Visit  Medication Sig Dispense Refill  . albuterol (VENTOLIN HFA) 108 (90 Base) MCG/ACT inhaler Inhale 1 puff into the lungs as needed.    . carvedilol (COREG) 6.25 MG tablet Take 1 tablet (6.25 mg total) by mouth 2 (two) times daily with a meal. Please make overdue appt with Dr. Acie Fredrickson before anymore refills. 2nd attempt 30 tablet 0  . ibuprofen (ADVIL,MOTRIN) 200 MG tablet Take 800 mg by mouth daily as needed for moderate pain.    . Levonorgestrel (LILETTA, 52 MG,) 19.5 MCG/DAY IUD IUD Liletta 19.5 mcg/24 hrs (5 yrs) 52 mg intrauterine device  Take 1 device by intrauterine route.    . rosuvastatin (CRESTOR) 10 MG tablet Take 1 tablet (10 mg total) by mouth daily. Please make overdue appt with Dr. Acie Fredrickson before anymore refills. 2nd attempt 15 tablet 0  . sacubitril-valsartan (ENTRESTO) 49-51 MG Take 1 tablet by mouth 2 (two) times daily. 180 tablet 3  . spironolactone (ALDACTONE) 25 MG tablet Take 1 tablet (25 mg total) by mouth daily. Please make overdue appt with Dr. Acie Fredrickson before anymore refills. 2nd attempt 15 tablet 0  . vitamin B-12 (CYANOCOBALAMIN) 500 MCG tablet Take 500 mcg by mouth daily.     No current facility-administered medications on file prior to visit.    Allergies  Allergen Reactions  . Codeine Nausea And Vomiting  . Penicillins Hives    Did it involve  swelling of the face/tongue/throat, SOB, or low BP? No Did it involve sudden or severe rash/hives, skin peeling, or any reaction on the inside of your mouth or nose? No Did you need to seek medical attention at a hospital or doctor's office? No When did it last happen?Childhood allergy If all above answers are "NO", may proceed  with cephalosporin use.     Past Medical History:  Diagnosis Date  . Allergy   . Chronic headache   . Depression   . Former smoker    quit in 53  . History of kidney stones   . Hyperlipidemia   . Left bundle branch block   . Obesity   . Systolic dysfunction, left ventricle    EF is 43% per Myoview; dyssynchrony of the septum noted on echo; has LBBB    Past Surgical History:  Procedure Laterality Date  . CHOLECYSTECTOMY  2009  . CYSTOSCOPY WITH RETROGRADE PYELOGRAM, URETEROSCOPY AND STENT PLACEMENT  03/06/2012   Procedure: CYSTOSCOPY WITH RETROGRADE PYELOGRAM, URETEROSCOPY AND STENT PLACEMENT;  Surgeon: Molli Hazard, MD;  Location: [facility];  Service: Urology;  Laterality: Left;  . CYSTOSCOPY WITH RETROGRADE PYELOGRAM, URETEROSCOPY AND STENT PLACEMENT Left 03/20/2012   Procedure: CYSTOSCOPY WITH RETROGRADE PYELOGRAM, URETEROSCOPY AND STENT PLACEMENT;  Surgeon: Molli Hazard, MD;  Location: [facility];  Service: Urology;  Laterality: Left;  balloon dilitation  . CYSTOSCOPY/URETEROSCOPY/HOLMIUM LASER/STENT PLACEMENT Left 03/08/2018   Procedure: CYSTOSCOPY/RETROGRADE/URETEROSCOPY/STENT PLACEMENT;  Surgeon: Ceasar Mons, MD;  Location[facility];  Service: Urology;  Laterality: Left;  . EXTRACORPOREAL SHOCK WAVE LITHOTRIPSY Left 01/24/2018   Procedure: EXTRACORPOREAL SHOCK WAVE LITHOTRIPSY (ESWL);  Surgeon: Alexis Frock, MD;  Location: [facility];  Service: Urology;  Laterality: Left;  . FOOT SURGERY  12/2005  . HOLMIUM LASER APPLICATION Left 08/09/6267   Procedure: HOLMIUM LASER APPLICATION;  Surgeon:  Molli Hazard, MD;  Location: [facility];  Service: Urology;  Laterality: Left;  . TONSILLECTOMY  03/2010  . WRIST SURGERY  11/2001    Social History   Tobacco Use  Smoking Status Former Smoker  . Quit date: 01/31/1991  . Years since quitting: 28.7  Smokeless Tobacco Never Used    Social History   Substance and Sexual Activity  Alcohol Use Yes   Comment: occ    Family History  Problem Relation Age of Onset  . Hyperlipidemia Mother   . Hyperlipidemia Brother   . Hyperlipidemia Brother   . Colon cancer Neg Hx     Reviw of Systems: Noted in current history, otherwise review of systems is negative.  Physical Exam: Blood pressure 108/72, pulse 80, height 5\' 10"  (1.778 m), weight 235 lb (106.6 kg), SpO2 99 %.  GEN:  Well nourished, well developed in no acute distress HEENT: Normal NECK: No JVD; No carotid bruits LYMPHATICS: No lymphadenopathy CARDIAC: RRR , no murmurs, rubs, gallops RESPIRATORY:  Clear to auscultation without rales, wheezing or rhonchi  ABDOMEN: Soft, non-tender, non-distended MUSCULOSKELETAL:  No edema; No deformity  SKIN: Warm and dry NEUROLOGIC:  Alert and oriented x 3    ECG:  Assessment / Plan:   1. Left bundle branch block -   Stable .  Cont same meds   2.   Chronic  Combined systolic  /  diastolic  CHF : Continue entresto , carvedilol, spironolactone  Bmp today   3.   HLD -  Cont crestor.   Check labs today   4. Palpitations - stable      Mertie Moores, MD  10/14/2019 8:57 AM    [facility] [name],  [place], [place]  [address] Pager [phone] Phone: [phone]; Fax: [phone]

## 2019-10-15 ENCOUNTER — Other Ambulatory Visit: Payer: Self-pay | Admitting: *Deleted

## 2019-10-15 DIAGNOSIS — E782 Mixed hyperlipidemia: Secondary | ICD-10-CM

## 2019-10-15 DIAGNOSIS — I5022 Chronic systolic (congestive) heart failure: Secondary | ICD-10-CM

## 2019-10-21 ENCOUNTER — Other Ambulatory Visit: Payer: Self-pay

## 2019-10-21 DIAGNOSIS — I5042 Chronic combined systolic (congestive) and diastolic (congestive) heart failure: Secondary | ICD-10-CM

## 2019-10-21 MED ORDER — ROSUVASTATIN CALCIUM 10 MG PO TABS
10.0000 mg | ORAL_TABLET | Freq: Every day | ORAL | 3 refills | Status: DC
Start: 2019-10-21 — End: 2020-09-14

## 2019-10-21 MED ORDER — CARVEDILOL 6.25 MG PO TABS: 6.2500 mg | ORAL_TABLET | Freq: Two times a day (BID) | ORAL | 3 refills | Status: DC

## 2019-10-21 MED ORDER — SPIRONOLACTONE 25 MG PO TABS
25.0000 mg | ORAL_TABLET | Freq: Every day | ORAL | 3 refills | Status: DC
Start: 2019-10-21 — End: 2020-09-14

## 2019-11-10 ENCOUNTER — Ambulatory Visit: Payer: BC Managed Care – PPO | Admitting: Nurse Practitioner

## 2019-11-10 DIAGNOSIS — M722 Plantar fascial fibromatosis: Secondary | ICD-10-CM | POA: Diagnosis not present

## 2019-11-10 DIAGNOSIS — M7662 Achilles tendinitis, left leg: Secondary | ICD-10-CM | POA: Diagnosis not present

## 2019-11-10 NOTE — Progress Notes (Deleted)
11/10/2019 Kendra [name] [id number] [birth year]   CHIEF COMPLAINT: Rectal pain   HISTORY OF PRESENT ILLNESS:  Kendra [name] is a 49 year old female with a past medical history of depression, hyperlipidemia, left bundle branch block, systolic and diastolic CHF EF of 35-46% per ECHO 2020, chronic headaches. + Covid 19 infection 12/2018 and kidney stones.  S/P cholecystectomy secondary to gallstones 2009 and s/p cystoscopy with left ureteroscopy, basket stone removal and left ureteral stent placement 03/08/2018. She was last seen in our office by Dr. Ardis Hughs 10/27/2016 due to having chronic loose stools, anal pain, chronic RLQ pain and new epigastric pain. An EGD and colonoscopy were done 12/15/2016. The EGD showed mild reflux esophagitis and gastritis. No evidence of Barrett's esophagus or H. pylori. The colonoscopy identified a 3 mm tubular adenomatous polyp which was removed from the sigmoid colon and external hemorrhoids. She was advised to repeat a colonoscopy in 5 years. She was started on Colestid for her chronic diarrhea.  CBC Latest Ref Rng & Units 03/07/2018 12/07/2017 08/20/2016  WBC 4.0 - 10.5 K/uL 6.9 9.7 7.5  Hemoglobin 12.0 - 15.0 g/dL 13.4 14.0 14.0  Hematocrit 36 - 46 % 42.4 43.9 41.6  Platelets 150 - 400 K/uL 275 260 319    CMP Latest Ref Rng & Units 10/14/2019 03/07/2018 12/07/2017  Glucose 65 - 99 mg/dL 100(H) 104(H) 93  BUN 6 - 24 mg/dL 19 12 11   Creatinine 0.57 - 1.00 mg/dL 0.87 0.77 0.89  Sodium 134 - 144 mmol/L 141 140 139  Potassium 3.5 - 5.2 mmol/L 5.2 4.1 3.4(L)  Chloride 96 - 106 mmol/L 105 108 105  CO2 20 - 29 mmol/L 24 24 23   Calcium 8.7 - 10.2 mg/dL 9.7 9.3 9.1  Total Protein 6.0 - 8.5 g/dL 6.6 - 7.1  Total Bilirubin 0.0 - 1.2 mg/dL 0.5 - 0.6  Alkaline Phos 44 - 121 IU/L 69 - 70  AST 0 - 40 IU/L 24 - 22  ALT 0 - 32 IU/L 40(H) - 22     EGD 12/15/2016:  - Mild acid related esophagitis. - Mild gastritis, biopsied to check for H. pylori. - The  examination was otherwise normal.  Colonoscopy 12/15/2016: - The examined portion of the ileum was normal. - One 3 mm polyp in the sigmoid colon, removed with a cold snare. Resected and retrieved. - Small external hemorrhoids. - The examination was otherwise normal on direct and retroflexion views. - Biopsies were taken with a cold forceps from the entire colon for evaluation of microscopic Colitis.   Upper endoscopy October 2007:The examination was normal Colonoscopy October 2007: Findings normal terminal ileum. Colonic because it was normal throughout. Biopsies were done randomly to check for microscopic colitis and they were all normal   Dr. Ardis Hughs  Past Medical History:  Diagnosis Date   Allergy    Chronic headache    Depression    Former smoker    quit in 89   History of kidney stones    Hyperlipidemia    Left bundle branch block    Obesity    Systolic dysfunction, left ventricle    EF is 43% per Myoview; dyssynchrony of the septum noted on echo; has LBBB   Past Surgical History:  Procedure Laterality Date   CHOLECYSTECTOMY  2009   CYSTOSCOPY WITH RETROGRADE PYELOGRAM, URETEROSCOPY AND STENT PLACEMENT  03/06/2012   Procedure: CYSTOSCOPY WITH RETROGRADE PYELOGRAM, URETEROSCOPY AND STENT PLACEMENT;  Surgeon: Molli Hazard, MD;  Location: [facility];  Service: Urology;  Laterality: Left;   CYSTOSCOPY WITH RETROGRADE PYELOGRAM, URETEROSCOPY AND STENT PLACEMENT Left 03/20/2012   Procedure: CYSTOSCOPY WITH RETROGRADE PYELOGRAM, URETEROSCOPY AND STENT PLACEMENT;  Surgeon: Molli Hazard, MD;  Location: [facility];  Service: Urology;  Laterality: Left;  balloon dilitation   CYSTOSCOPY/URETEROSCOPY/HOLMIUM LASER/STENT PLACEMENT Left 03/08/2018   Procedure: CYSTOSCOPY/RETROGRADE/URETEROSCOPY/STENT PLACEMENT;  Surgeon: Ceasar Mons, MD;  Location: [facility];  Service: Urology;  Laterality: Left;   EXTRACORPOREAL SHOCK  WAVE LITHOTRIPSY Left 01/24/2018   Procedure: EXTRACORPOREAL SHOCK WAVE LITHOTRIPSY (ESWL);  Surgeon: Alexis Frock, MD;  Location: [facility];  Service: Urology;  Laterality: Left;   FOOT SURGERY  12/2005   HOLMIUM LASER APPLICATION Left 0/62/3762   Procedure: HOLMIUM LASER APPLICATION;  Surgeon: Molli Hazard, MD;  Location: [facility];  Service: Urology;  Laterality: Left;   TONSILLECTOMY  03/2010   WRIST SURGERY  11/2001   Social History:  Family History:    reports that she quit smoking about 28 years ago. She has never used smokeless tobacco. She reports current alcohol use. She reports that she does not use drugs. family history includes Hyperlipidemia in her brother, brother, and mother.   Allergies  Allergen Reactions   Codeine Nausea And Vomiting   Penicillins Hives    Did it involve swelling of the face/tongue/throat, SOB, or low BP? No Did it involve sudden or severe rash/hives, skin peeling, or any reaction on the inside of your mouth or nose? No Did you need to seek medical attention at a hospital or doctor's office? No When did it last happen?Childhood allergy If all above answers are NO, may proceed with cephalosporin use.       Outpatient Encounter Medications as of 11/10/2019  Medication Sig   albuterol (VENTOLIN HFA) 108 (90 Base) MCG/ACT inhaler Inhale 1 puff into the lungs as needed.   carvedilol (COREG) 6.25 MG tablet Take 1 tablet (6.25 mg total) by mouth 2 (two) times daily with a meal.   ibuprofen (ADVIL,MOTRIN) 200 MG tablet Take 800 mg by mouth daily as needed for moderate pain.   Levonorgestrel (LILETTA, 52 MG,) 19.5 MCG/DAY IUD IUD Liletta 19.5 mcg/24 hrs (5 yrs) 52 mg intrauterine device  Take 1 device by intrauterine route.   rosuvastatin (CRESTOR) 10 MG tablet Take 1 tablet (10 mg total) by mouth daily.   sacubitril-valsartan (ENTRESTO) 49-51 MG Take 1 tablet by mouth 2 (two) times daily.   spironolactone  (ALDACTONE) 25 MG tablet Take 1 tablet (25 mg total) by mouth daily.   vitamin B-12 (CYANOCOBALAMIN) 500 MCG tablet Take 500 mcg by mouth daily.   No facility-administered encounter medications on file as of 11/10/2019.     REVIEW OF SYSTEMS: All other systems reviewed and negative except where noted in the History of Present Illness.  Gen: Denies fever, sweats or chills. No weight loss.  CV: Denies chest pain, palpitations or edema. Resp: Denies cough, shortness of breath of hemoptysis.  GI: Denies heartburn, dysphagia, stomach or lower abdominal pain. No diarrhea or constipation.  GU : Denies urinary burning, blood in urine, increased urinary frequency or incontinence. MS: Denies joint pain, muscles aches or weakness. Derm: Denies rash, itchiness, skin lesions or unhealing ulcers. Psych: Denies depression, anxiety, memory loss, suicidal ideation and confusion. Heme: Denies bruising, bleeding. Neuro:  Denies headaches, dizziness or paresthesias. Endo:  Denies any problems with DM, thyroid or adrenal function.    PHYSICAL EXAM: There were no vitals taken for this  visit. General: Well developed ... in no acute distress. Head: Normocephalic and atraumatic. Eyes:  Sclerae non-icteric, conjunctive pink. Ears: Normal auditory acuity. Mouth: Dentition intact. No ulcers or lesions.  Neck: Supple, no lymphadenopathy or thyromegaly.  Lungs: Clear bilaterally to auscultation without wheezes, crackles or rhonchi. Heart: Regular rate and rhythm. No murmur, rub or gallop appreciated.  Abdomen: Soft, nontender, non distended. No masses. No hepatosplenomegaly. Normoactive bowel sounds x 4 quadrants.  Rectal:  Musculoskeletal: Symmetrical with no gross deformities. Skin: Warm and dry. No rash or lesions on visible extremities. Extremities: No edema. Neurological: Alert oriented x 4, no focal deficits.  Psychological:  Alert and cooperative. Normal mood and affect.  ASSESSMENT AND  PLAN:  46. 49 year old female with rectal pain -Colonoscopy benefits and risks discussed including risk with sedation, risk of bleeding, perforation and infection    Left BBB, stable.   Chronic systolic and diastolic CHF on Entresto, Carevedilol and Spironolactone    CC:  Tower, Wynelle Fanny, MD

## 2019-11-11 IMAGING — CT CT ABD-PELV W/ CM
2 of 5 series · 15 of 46 positions shown, 17 images · IV contrast (APPLIED)
Comparison: None.

CLINICAL DATA: Abdominal pain radiating to the back. Nausea and
vomiting.

EXAM:
CT ABDOMEN AND PELVIS WITH CONTRAST
TECHNIQUE: Multidetector CT imaging of the abdomen and pelvis was performed
using the standard protocol following bolus administration of
intravenous contrast.
CONTRAST:  100mL Z0O8BH-NAA IOPAMIDOL (Z0O8BH-NAA) INJECTION 61%

[Series 2: axial st · axial · 0.78mm/px · z∈[-486,-1]mm · 12 of 109 slices shown, 14 images]
[im 6/109  soft-tissue]
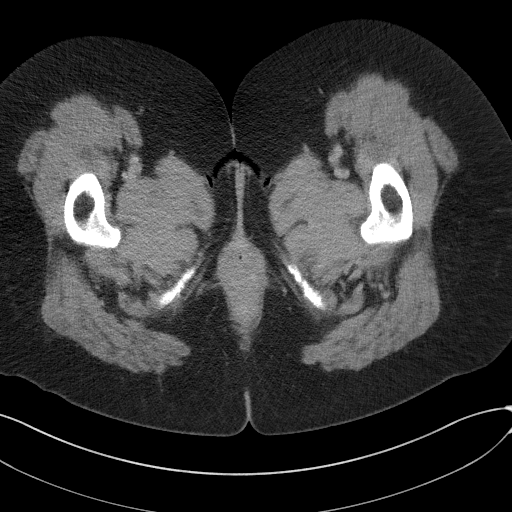
[im 6/109  bone]
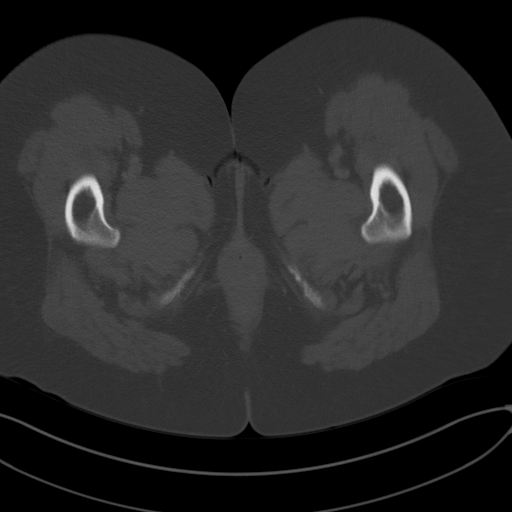
[im 18/109  soft-tissue]
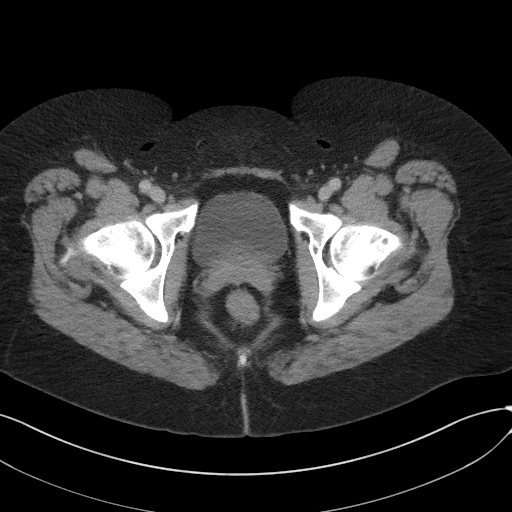
[im 23/109  soft-tissue]
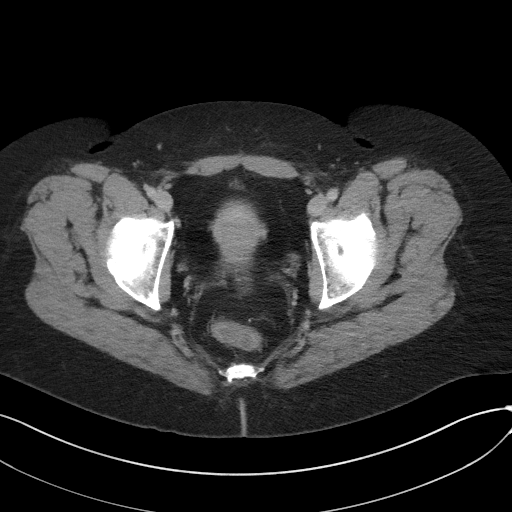
[im 35/109  soft-tissue]
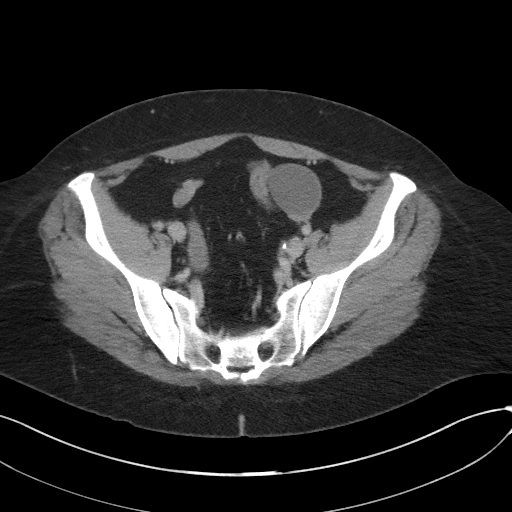
[im 40/109  soft-tissue]
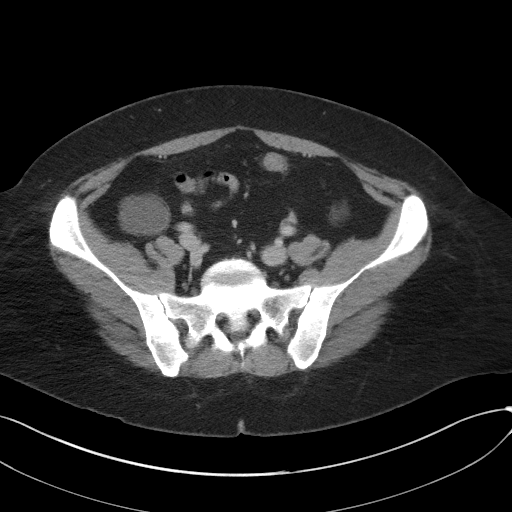
[im 52/109  soft-tissue]
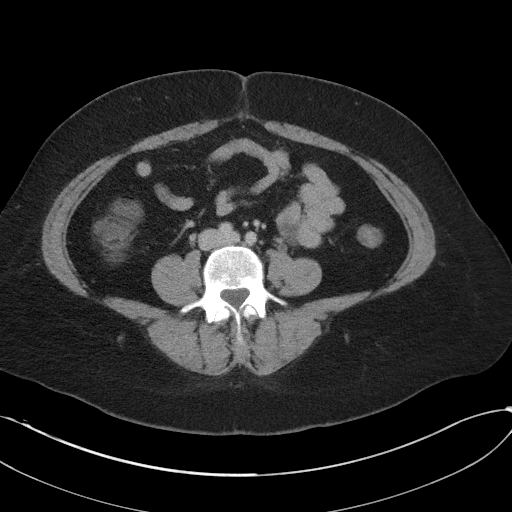
[im 57/109  soft-tissue]
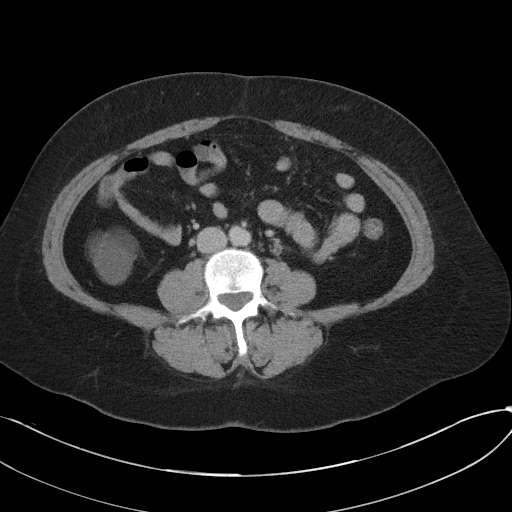
[im 69/109  soft-tissue]
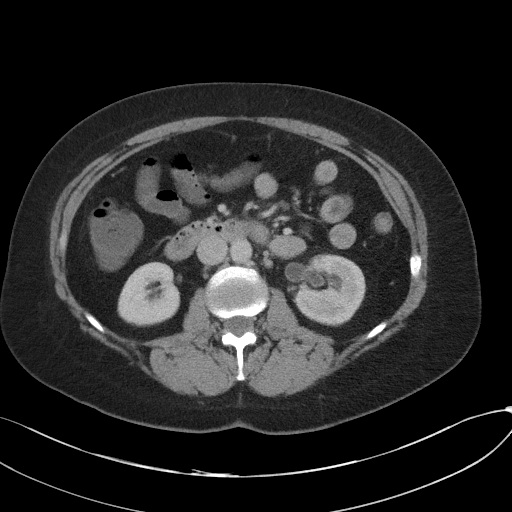
[im 74/109  soft-tissue]
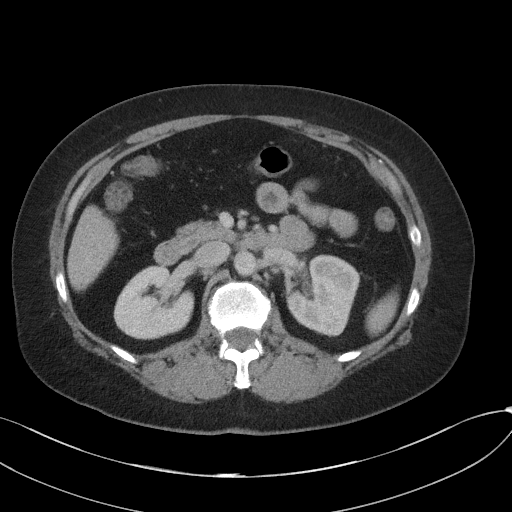
[im 74/109  bone]
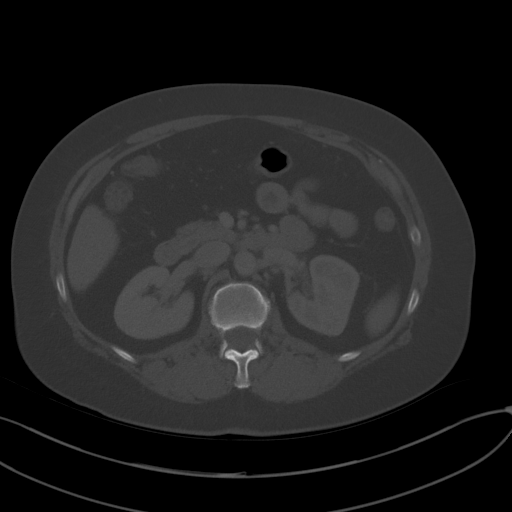
[im 86/109  soft-tissue]
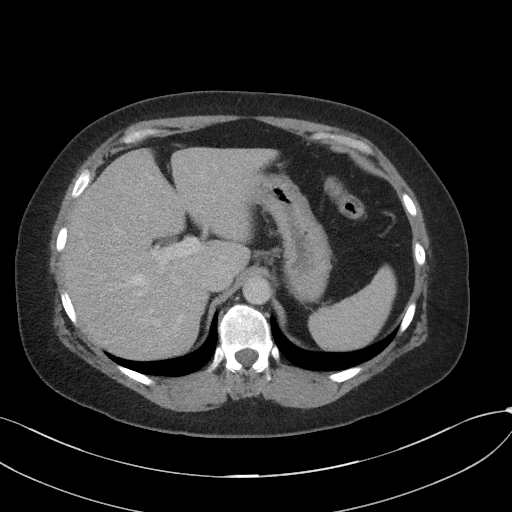
[im 91/109  soft-tissue]
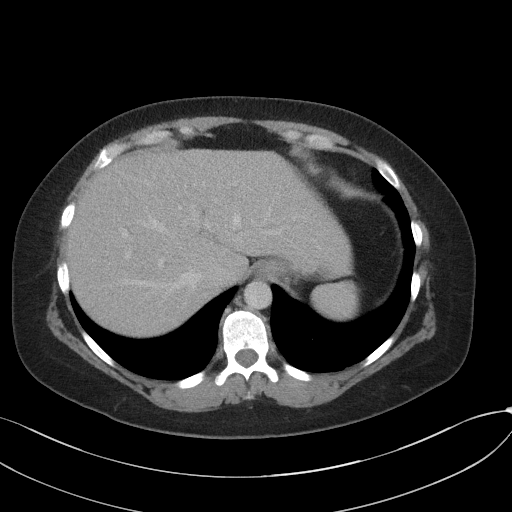
[im 103/109  soft-tissue]
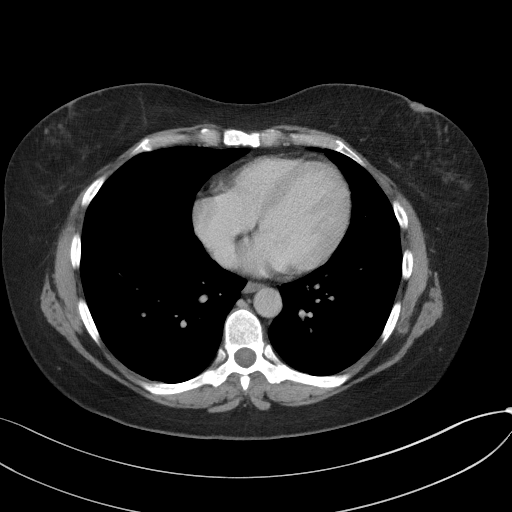

[Series 5: coronal st · coronal · 0.86mm/px · 3 of 89 slices shown]
[im 30/89  soft-tissue]
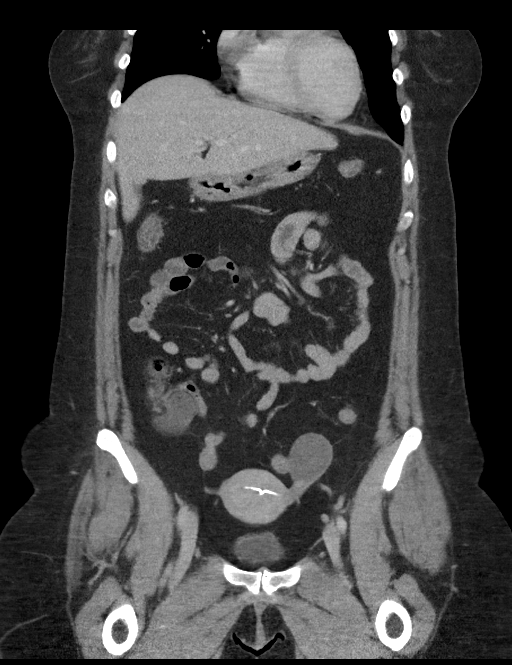
[im 40/89  soft-tissue]
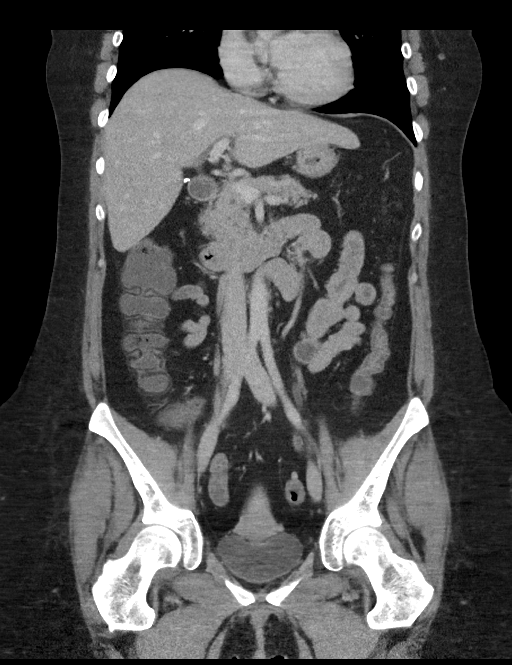
[im 49/89  soft-tissue]
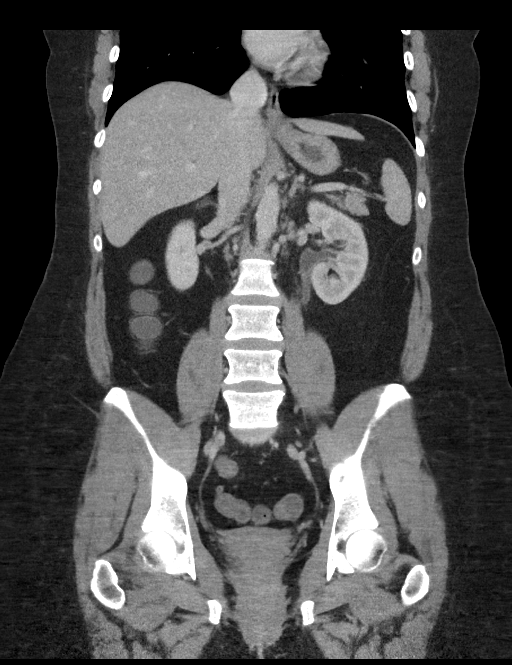

[15 of 46 positions shown; findings below may reference images not displayed]

FINDINGS: Lower chest: No acute abnormality.

Hepatobiliary: No focal liver abnormality is seen. Status post
cholecystectomy. No biliary dilatation.

Pancreas: Unremarkable. No pancreatic ductal dilatation or
surrounding inflammatory changes.

Spleen: Normal in size without focal abnormality.

Adrenals/Urinary Tract: Normal appearance of the adrenal glands. 6
mm nonobstructive right lower pole renal calculus. Normal appearance
of the right ureter. Normal appearance of the urinary bladder. There
is an obstructive 5 mm calculus within the distal [DATE] of the left
ureter. There is a mild upstream left hydroureter and
hydronephrosis. Additionally, there is indistinctness and
hyperenhancement of the wall of the left ureter and the left renal
collecting system, suggestive of superimposed infectious changes.

Stomach/Bowel: Stomach is within normal limits. Appendix appears
normal. No evidence of bowel wall thickening, distention, or
inflammatory changes.

Vascular/Lymphatic: No significant vascular findings are present. No
enlarged abdominal or pelvic lymph nodes.

Reproductive: IUD in place within the uterus. Normal right adnexa.
4.3 cm left ovarian cyst.

Other: No abdominal wall hernia or abnormality. No abdominopelvic
ascites.

Musculoskeletal: No acute or significant osseous findings.
IMPRESSION: 5 mm left mid to distal ureteral obstructive calculus.
Indistinctness and hyperenhancement of the walls of the proximal
ureter at the left renal collecting system are suggestive of
superimposed infectious changes. No enhancement abnormalities of the
left renal parenchyma to suggest frank acute pyelonephritis.

6 mm nonobstructive right lower pole renal calculus.

4.3 cm left ovarian cyst. This is most likely benign in a
premenopausal patient. Follow-up and 6-8 weeks with pelvic
ultrasound may be considered.

## 2019-12-02 DIAGNOSIS — M722 Plantar fascial fibromatosis: Secondary | ICD-10-CM | POA: Diagnosis not present

## 2019-12-02 DIAGNOSIS — M79605 Pain in left leg: Secondary | ICD-10-CM | POA: Diagnosis not present

## 2019-12-10 DIAGNOSIS — M722 Plantar fascial fibromatosis: Secondary | ICD-10-CM | POA: Diagnosis not present

## 2019-12-10 DIAGNOSIS — M79605 Pain in left leg: Secondary | ICD-10-CM | POA: Diagnosis not present

## 2019-12-15 DIAGNOSIS — M7672 Peroneal tendinitis, left leg: Secondary | ICD-10-CM | POA: Diagnosis not present

## 2019-12-15 DIAGNOSIS — M25472 Effusion, left ankle: Secondary | ICD-10-CM | POA: Diagnosis not present

## 2019-12-15 DIAGNOSIS — M7732 Calcaneal spur, left foot: Secondary | ICD-10-CM | POA: Diagnosis not present

## 2019-12-15 DIAGNOSIS — R609 Edema, unspecified: Secondary | ICD-10-CM | POA: Diagnosis not present

## 2019-12-15 DIAGNOSIS — R600 Localized edema: Secondary | ICD-10-CM | POA: Diagnosis not present

## 2019-12-15 DIAGNOSIS — M722 Plantar fascial fibromatosis: Secondary | ICD-10-CM | POA: Diagnosis not present

## 2019-12-23 DIAGNOSIS — M722 Plantar fascial fibromatosis: Secondary | ICD-10-CM | POA: Diagnosis not present

## 2020-01-05 DIAGNOSIS — M722 Plantar fascial fibromatosis: Secondary | ICD-10-CM | POA: Diagnosis not present

## 2020-01-14 ENCOUNTER — Other Ambulatory Visit: Payer: BC Managed Care – PPO

## 2020-01-29 DIAGNOSIS — M722 Plantar fascial fibromatosis: Secondary | ICD-10-CM | POA: Diagnosis not present

## 2020-02-04 DIAGNOSIS — M722 Plantar fascial fibromatosis: Secondary | ICD-10-CM | POA: Diagnosis not present

## 2020-03-22 ENCOUNTER — Other Ambulatory Visit: Payer: Self-pay

## 2020-03-22 ENCOUNTER — Other Ambulatory Visit: Payer: BC Managed Care – PPO | Admitting: *Deleted

## 2020-03-22 DIAGNOSIS — E782 Mixed hyperlipidemia: Secondary | ICD-10-CM | POA: Diagnosis not present

## 2020-03-22 DIAGNOSIS — I5022 Chronic systolic (congestive) heart failure: Secondary | ICD-10-CM

## 2020-03-22 LAB — HEPATIC FUNCTION PANEL
ALT: 19 IU/L (ref 0–32)
AST: 19 IU/L (ref 0–40)
Albumin: 4.4 g/dL (ref 3.8–4.8)
Alkaline Phosphatase: 84 IU/L (ref 44–121)
Bilirubin Total: 0.6 mg/dL (ref 0.0–1.2)
Bilirubin, Direct: 0.2 mg/dL (ref 0.00–0.40)
Total Protein: 6.2 g/dL (ref 6.0–8.5)

## 2020-03-22 LAB — BASIC METABOLIC PANEL
BUN/Creatinine Ratio: 10 (ref 9–23)
BUN: 8 mg/dL (ref 6–24)
CO2: 22 mmol/L (ref 20–29)
Calcium: 9.3 mg/dL (ref 8.7–10.2)
Chloride: 103 mmol/L (ref 96–106)
Creatinine, Ser: 0.84 mg/dL (ref 0.57–1.00)
GFR calc Af Amer: 95 mL/min/{1.73_m2} (ref >59)
GFR calc non Af Amer: 82 mL/min/{1.73_m2} (ref >59)
Glucose: 98 mg/dL (ref 65–99)
Potassium: 4.8 mmol/L (ref 3.5–5.2)
Sodium: 137 mmol/L (ref 134–144)

## 2020-03-22 LAB — LIPID PANEL
Chol/HDL Ratio: 2.6 ratio (ref 0.0–4.4)
Cholesterol, Total: 152 mg/dL (ref 100–199)
HDL: 59 mg/dL (ref >39)
LDL Chol Calc (NIH): 81 mg/dL (ref 0–99)
Triglycerides: 61 mg/dL (ref 0–149)
VLDL Cholesterol Cal: 12 mg/dL (ref 5–40)

## 2020-04-14 ENCOUNTER — Other Ambulatory Visit: Payer: Self-pay

## 2020-04-14 MED ORDER — ENTRESTO 49-51 MG PO TABS: 1.0000 | ORAL_TABLET | Freq: Two times a day (BID) | ORAL | 1 refills | Status: DC

## 2020-04-26 DIAGNOSIS — M722 Plantar fascial fibromatosis: Secondary | ICD-10-CM | POA: Diagnosis not present

## 2020-07-15 DIAGNOSIS — M9902 Segmental and somatic dysfunction of thoracic region: Secondary | ICD-10-CM | POA: Diagnosis not present

## 2020-07-15 DIAGNOSIS — M47812 Spondylosis without myelopathy or radiculopathy, cervical region: Secondary | ICD-10-CM | POA: Diagnosis not present

## 2020-07-15 DIAGNOSIS — M9901 Segmental and somatic dysfunction of cervical region: Secondary | ICD-10-CM | POA: Diagnosis not present

## 2020-07-16 DIAGNOSIS — M9902 Segmental and somatic dysfunction of thoracic region: Secondary | ICD-10-CM | POA: Diagnosis not present

## 2020-07-16 DIAGNOSIS — M9901 Segmental and somatic dysfunction of cervical region: Secondary | ICD-10-CM | POA: Diagnosis not present

## 2020-07-16 DIAGNOSIS — M47812 Spondylosis without myelopathy or radiculopathy, cervical region: Secondary | ICD-10-CM | POA: Diagnosis not present

## 2020-08-12 DIAGNOSIS — M9902 Segmental and somatic dysfunction of thoracic region: Secondary | ICD-10-CM | POA: Diagnosis not present

## 2020-08-12 DIAGNOSIS — M9901 Segmental and somatic dysfunction of cervical region: Secondary | ICD-10-CM | POA: Diagnosis not present

## 2020-08-12 DIAGNOSIS — M47812 Spondylosis without myelopathy or radiculopathy, cervical region: Secondary | ICD-10-CM | POA: Diagnosis not present

## 2020-08-18 DIAGNOSIS — M47812 Spondylosis without myelopathy or radiculopathy, cervical region: Secondary | ICD-10-CM | POA: Diagnosis not present

## 2020-08-18 DIAGNOSIS — M9901 Segmental and somatic dysfunction of cervical region: Secondary | ICD-10-CM | POA: Diagnosis not present

## 2020-08-18 DIAGNOSIS — M9902 Segmental and somatic dysfunction of thoracic region: Secondary | ICD-10-CM | POA: Diagnosis not present

## 2020-08-31 DIAGNOSIS — R350 Frequency of micturition: Secondary | ICD-10-CM | POA: Diagnosis not present

## 2020-08-31 DIAGNOSIS — N3001 Acute cystitis with hematuria: Secondary | ICD-10-CM | POA: Diagnosis not present

## 2020-09-14 ENCOUNTER — Other Ambulatory Visit: Payer: Self-pay | Admitting: Cardiovascular Disease

## 2020-09-14 DIAGNOSIS — I5042 Chronic combined systolic (congestive) and diastolic (congestive) heart failure: Secondary | ICD-10-CM

## 2020-09-15 ENCOUNTER — Telehealth: Payer: Self-pay | Admitting: Family Medicine

## 2020-09-15 NOTE — Telephone Encounter (Signed)
Mrs. Jaonna called in wanted to know if she can reestablish care

## 2020-09-15 NOTE — Telephone Encounter (Signed)
Not taking new patients right now

## 2020-09-16 NOTE — Telephone Encounter (Signed)
Pt has been called and informed of info below

## 2020-09-28 DIAGNOSIS — M722 Plantar fascial fibromatosis: Secondary | ICD-10-CM | POA: Diagnosis not present

## 2020-10-10 ENCOUNTER — Encounter: Payer: Self-pay | Admitting: Cardiovascular Disease

## 2020-10-10 NOTE — Progress Notes (Signed)
Kendra [name] Date of Birth  Jul 02, 1970 [facility]        [address]    [place]     [place],   [address]      [phone]  Fax  [phone]       Problem List: 1. Left bundle branch block 2. Chest pain 3. Kidney stone 4. Palpitations  5. Chronic  systolic CHF - in the setting of LBBB.  EF 43% by myoview in April  2013.  EF 35% by echo Feb. 2019.      Kendra [name]  is a 50 y.o. female. She presented to her medical Dr. with some upper left-sided abdominal pain/breast pain.  This pain is occasionally pleuritic. She was thought to have a pulmonary illness. A d-dimer was subsequent on and found to be negative the An EKG showed left bundle branch block.   A subsequent echocardiogram revealed dyssynchrony of her septum.  She does not exercise on regular basis. She walks occasionally. She's been in the process of moving. She has noticed that she's a little bit more short of breath moving boxes. She's also noticed some left arm tingling and numbness which radiates down to her fingers.  We performed a Lexiscan Myoview which revealed an EF of 43%.  Overall Impression: Low risk stress nuclear study. No ischemia or infarct. Baseline ECG LBBB with decreased EF  LV Ejection Fraction: 43%. LV Wall Motion: Apical hypokinesis  We initially had her on Toprol-XL but she did not tolerate it. She felt very lethargic. We now have her on carvedilol which he seems to tolerate much better.  She's exercising on occasion.  She has been measuring her BP on occasion.  Her readings have been ok but several have been elevated.  Feb. 27, 2014:  She has had several kidney surgeries since I last saw her.  She had some chest pressure when she woke up for anesthesia. She has had some bad indigestion since then.  Better with antiacids.  She has not been exercising much for the past month.  Her BP has been well controlled.    Nov. 10, 2014:  She is doing well. Having some PVCs on occasion.   She has been walking some.  She has had occaisonal ankle swelling.    No edema today.  She has a new job.   April 22, 2013:    Kendra [name]  presents today for further evaluation of palpitations. She's been under a little bit more stress than usual. She's also been fatigued.   We placed a Holter monitor. She had sinus tachycardia as well as sinus bradycardia. She has an underlying bundle branch block. There were no arrhythmias to suggest SVT   July 07, 2013:  Kendra [name] is doing well.  Able to do all of her normal activities without problems. Trying to exercise.  She has not had many palpitations.  Sleeping better.    Wants to delay getting the sleep study.  July 28, 2014:  Doing great . Not many palpitations  Exercising  - walking several times a week .   Doing great.  Has lost 8 lbs.   Sept. 1, 2017:  Doing well No CP , no dyspnea Walking ,  Has lost 9 lbs since her last visit .   February 06, 2017:  Feeling well. Has gained some weight.    Works in [employer]  ( [place])   No CP   Not avoiding salt .  Feb. 5, 2019:  Kendra [name] is seen back for her chronic systolic congestive heart failure, left bundle branch block. Echocardiogram from January 21 reveals reduced left ventricular systolic function with an EF of 35%.   We added Entresto 24-26 mg twice a day. Has not been exercising   Feb. 28, 2019 Seen with husband, Kendra [name] Has had some low BP readings  Since adding Entresto  Has had 2-3 episodes of lightheadedness -  Starting to exercise but she has lots of DOE .  Has noticed some symptoms with exercise since she is short of breath .  May 08, 2017:  Kendra [name] is seen back for eval of her chf and LBBB  Started on Entresto 49-51  - tolerating it well Had a myoview - which revealed septal defect - thought to be due to LBBB .   Coronary CT angiogram was normal  Still having some DOE  Climbing stairs  No near syncope ,   Tolerating meds well  Jun 15, 2017:   Kendra [name] is seen today for  follow-up of her chronic systolic congestive heart failure. We increased her carvedilol to 6.25 mg twice a day at her last visit. Wt = 227,  Tolerating the coreg well  Still has some shortness of breath .   Sept. 14, 2021  Kendra [name] is seen today for follow up of her chf. Had a vidio visit last year Echo from 2020 shows normal EF of 50-55% Some dyssynchrony from LBBB Feeling well Has some indigestion Has had some feet swelling  Had bronchitis several weeks ago , has been on some medrol dose packs   Has not had the covid vaccine yet. Had covid in Nov. 2020  Sept. 12, 2022: Kendra [name] is seen today for follow up of her CHF Has LBBB with dyssynchrony Wt is 233 lbs.  No CP , no dyspnea  Doing some yoga  Does not like to walk ,  likes to work out in the AM .     Current Outpatient Medications on File Prior to Visit  Medication Sig Dispense Refill   albuterol (VENTOLIN HFA) 108 (90 Base) MCG/ACT inhaler Inhale 1 puff into the lungs as needed.     carvedilol (COREG) 6.25 MG tablet Take 1 tablet by mouth twice daily with a meal. 180 tablet 0   ENTRESTO 49-51 MG Take 1 tablet by mouth twice daily. **Please schedule an appointment.** 180 tablet 1   ibuprofen (ADVIL,MOTRIN) 200 MG tablet Take 800 mg by mouth daily as needed for moderate pain.     levonorgestrel (LILETTA) 19.5 MCG/DAY IUD IUD Liletta 19.5 mcg/24 hrs (5 yrs) 52 mg intrauterine device  Take 1 device by intrauterine route.     rosuvastatin (CRESTOR) 10 MG tablet Take 1 tablet by mouth once daily. 90 tablet 0   spironolactone (ALDACTONE) 25 MG tablet Take 1 tablet by mouth daily. 90 tablet 0   vitamin B-12 (CYANOCOBALAMIN) 500 MCG tablet Take 500 mcg by mouth daily.     phenazopyridine (PYRIDIUM) 200 MG tablet Take 200 mg by mouth 3 (three) times daily. (Patient not taking: Reported on 10/11/2020)     No current facility-administered medications on file prior to visit.    Allergies  Allergen Reactions   Codeine Nausea And  Vomiting   Penicillins Hives    Did it involve swelling of the face/tongue/throat, SOB, or low BP? No Did it involve sudden or severe rash/hives, skin peeling, or any reaction on the inside of your mouth or nose? No Did you  need to seek medical attention at a hospital or doctor's office? No When did it last happen?      Childhood allergy If all above answers are "NO", may proceed with cephalosporin use.     Past Medical History:  Diagnosis Date   Allergy    Chronic headache    Depression    Former smoker    quit in 64   History of kidney stones    Hyperlipidemia    Left bundle branch block    Obesity    Systolic dysfunction, left ventricle    EF is 43% per Myoview; dyssynchrony of the septum noted on echo; has LBBB    Past Surgical History:  Procedure Laterality Date   CHOLECYSTECTOMY  2009   CYSTOSCOPY WITH RETROGRADE PYELOGRAM, URETEROSCOPY AND STENT PLACEMENT  03/06/2012   Procedure: CYSTOSCOPY WITH RETROGRADE PYELOGRAM, URETEROSCOPY AND STENT PLACEMENT;  Surgeon: Molli Hazard, MD;  Location: [facility];  Service: Urology;  Laterality: Left;   CYSTOSCOPY WITH RETROGRADE PYELOGRAM, URETEROSCOPY AND STENT PLACEMENT Left 03/20/2012   Procedure: CYSTOSCOPY WITH RETROGRADE PYELOGRAM, URETEROSCOPY AND STENT PLACEMENT;  Surgeon: Molli Hazard, MD;  Location: [facility];  Service: Urology;  Laterality: Left;  balloon dilitation   CYSTOSCOPY/URETEROSCOPY/HOLMIUM LASER/STENT PLACEMENT Left 03/08/2018   Procedure: CYSTOSCOPY/RETROGRADE/URETEROSCOPY/STENT PLACEMENT;  Surgeon: Ceasar Mons, MD;  Location: [facility];  Service: Urology;  Laterality: Left;   EXTRACORPOREAL SHOCK WAVE LITHOTRIPSY Left 01/24/2018   Procedure: EXTRACORPOREAL SHOCK WAVE LITHOTRIPSY (ESWL);  Surgeon: Alexis Frock, MD;  Location: [facility];  Service: Urology;  Laterality: Left;   FOOT SURGERY  12/2005   HOLMIUM LASER APPLICATION Left [id number]   Procedure:  HOLMIUM LASER APPLICATION;  Surgeon: Molli Hazard, MD;  Location: [facility];  Service: Urology;  Laterality: Left;   TONSILLECTOMY  03/2010   WRIST SURGERY  11/2001    Social History   Tobacco Use  Smoking Status Former   Types: Cigarettes   Quit date: 01/31/1991   Years since quitting: 29.7  Smokeless Tobacco Never    Social History   Substance and Sexual Activity  Alcohol Use Yes   Comment: occ    Family History  Problem Relation Age of Onset   Hyperlipidemia Mother    Hyperlipidemia Brother    Hyperlipidemia Brother    Colon cancer Neg Hx     Reviw of Systems: Noted in current history, otherwise review of systems is negative.   Physical Exam: Blood pressure 128/76, pulse 76, height '5\' 10"'$  (1.778 m), weight 233 lb 9.6 oz (106 kg).  GEN:  Well nourished, well developed in no acute distress HEENT: Normal NECK: No JVD; No carotid bruits LYMPHATICS: No lymphadenopathy CARDIAC: RRR , no murmurs, rubs, gallops RESPIRATORY:  Clear to auscultation without rales, wheezing or rhonchi  ABDOMEN: Soft, non-tender, non-distended MUSCULOSKELETAL:  No edema; No deformity  SKIN: Warm and dry NEUROLOGIC:  Alert and oriented x 3   ECG:    Sept. 12, 2022:   NSR at 77.  LBBB    Assessment / Plan:   1. Left bundle branch block -   stable   2.   Chronic  Combined systolic  /  diastolic  CHF :  Her LV systolic function is at the lower limits of normal   3.   HLD -    cont rosuvastatin   4. Palpitations -   better      Mertie Moores, MD  10/11/2020 5:01 PM  [address] [place],  [place], [place]  [address] Pager [phone] Phone: [phone]; Fax: [phone]

## 2020-10-11 ENCOUNTER — Ambulatory Visit (INDEPENDENT_AMBULATORY_CARE_PROVIDER_SITE_OTHER): Payer: BC Managed Care – PPO | Admitting: Cardiovascular Disease

## 2020-10-11 ENCOUNTER — Other Ambulatory Visit: Payer: Self-pay

## 2020-10-11 ENCOUNTER — Encounter: Payer: Self-pay | Admitting: Cardiovascular Disease

## 2020-10-11 VITALS — BP 128/76 | HR 76 | Ht 70.0 in | Wt 233.6 lb

## 2020-10-11 DIAGNOSIS — I447 Left bundle-branch block, unspecified: Secondary | ICD-10-CM

## 2020-10-11 DIAGNOSIS — I5042 Chronic combined systolic (congestive) and diastolic (congestive) heart failure: Secondary | ICD-10-CM | POA: Diagnosis not present

## 2020-10-11 DIAGNOSIS — E782 Mixed hyperlipidemia: Secondary | ICD-10-CM

## 2020-10-11 NOTE — Patient Instructions (Addendum)
Medication Instructions:  Your physician recommends that you continue on your current medications as directed. Please refer to the Current Medication list given to you today.  *If you need a refill on your cardiac medications before your next appointment, please call your pharmacy*   Lab Work: Your physician recommends that you return for lab work on Monday Dec. 12. You may come in anytime between 7:30 am and 4:45 pm. You will need to FAST for this appointment - nothing to eat or drink after midnight the night before except water.  If you have labs (blood work) drawn today and your tests are completely normal, you will receive your results only by: [place] (if you have MyChart) OR A paper copy in the mail If you have any lab test that is abnormal or we need to change your treatment, we will call you to review the results.   Testing/Procedures: Your physician has requested that you have an echocardiogram in 1 Year prior to your office visit. Echocardiography is a painless test that uses sound waves to create images of your heart. It provides your doctor with information about the size and shape of your heart and how well your heart's chambers and valves are working. This procedure takes approximately one hour. There are no restrictions for this procedure.    Follow-Up: At [facility], you and your health needs are our priority.  As part of our continuing mission to provide you with exceptional heart care, we have created designated Provider Care Teams.  These Care Teams include your primary Cardiologist (physician) and Advanced Practice Providers (APPs -  Physician Assistants and Nurse Practitioners) who all work together to provide you with the care you need, when you need it.   Your next appointment:   1 year(s)  The format for your next appointment:   In Person  Provider:   You may see Mertie Moores, MD or one of the following Advanced Practice Providers on your  designated Care Team:   Richardson Dopp, PA-C [place], [place]

## 2020-10-21 DIAGNOSIS — R051 Acute cough: Secondary | ICD-10-CM | POA: Diagnosis not present

## 2020-10-27 DIAGNOSIS — J189 Pneumonia, unspecified organism: Secondary | ICD-10-CM | POA: Diagnosis not present

## 2020-12-13 ENCOUNTER — Other Ambulatory Visit: Payer: Self-pay | Admitting: Cardiovascular Disease

## 2020-12-13 DIAGNOSIS — I5042 Chronic combined systolic (congestive) and diastolic (congestive) heart failure: Secondary | ICD-10-CM

## 2020-12-22 DIAGNOSIS — Z1389 Encounter for screening for other disorder: Secondary | ICD-10-CM | POA: Diagnosis not present

## 2020-12-22 DIAGNOSIS — Z1231 Encounter for screening mammogram for malignant neoplasm of breast: Secondary | ICD-10-CM | POA: Diagnosis not present

## 2020-12-22 DIAGNOSIS — Z130 Encounter for screening for diseases of the blood and blood-forming organs and certain disorders involving the immune mechanism: Secondary | ICD-10-CM | POA: Diagnosis not present

## 2020-12-22 DIAGNOSIS — N925 Other specified irregular menstruation: Secondary | ICD-10-CM | POA: Diagnosis not present

## 2020-12-22 DIAGNOSIS — R82998 Other abnormal findings in urine: Secondary | ICD-10-CM | POA: Diagnosis not present

## 2020-12-22 DIAGNOSIS — Z6834 Body mass index (BMI) 34.0-34.9, adult: Secondary | ICD-10-CM | POA: Diagnosis not present

## 2020-12-22 DIAGNOSIS — Z01419 Encounter for gynecological examination (general) (routine) without abnormal findings: Secondary | ICD-10-CM | POA: Diagnosis not present

## 2021-01-04 DIAGNOSIS — H35033 Hypertensive retinopathy, bilateral: Secondary | ICD-10-CM | POA: Diagnosis not present

## 2021-01-10 ENCOUNTER — Other Ambulatory Visit: Payer: Self-pay

## 2021-01-10 ENCOUNTER — Other Ambulatory Visit: Payer: BC Managed Care – PPO

## 2021-01-28 ENCOUNTER — Other Ambulatory Visit: Payer: Self-pay | Admitting: Urology

## 2021-01-28 DIAGNOSIS — N202 Calculus of kidney with calculus of ureter: Secondary | ICD-10-CM | POA: Diagnosis not present

## 2021-02-04 ENCOUNTER — Encounter (HOSPITAL_BASED_OUTPATIENT_CLINIC_OR_DEPARTMENT_OTHER): Payer: Self-pay | Admitting: Urology

## 2021-02-04 NOTE — Progress Notes (Signed)
Talked with patient. Hx and meds reviewed. Npo after MN. Arrival time 0600 driver secured. Take all AM meds with sip of water

## 2021-02-07 ENCOUNTER — Other Ambulatory Visit: Payer: Self-pay

## 2021-02-07 ENCOUNTER — Encounter (HOSPITAL_BASED_OUTPATIENT_CLINIC_OR_DEPARTMENT_OTHER): Payer: Self-pay | Admitting: Urology

## 2021-02-07 ENCOUNTER — Ambulatory Visit (HOSPITAL_BASED_OUTPATIENT_CLINIC_OR_DEPARTMENT_OTHER)
Admission: RE | Admit: 2021-02-07 | Discharge: 2021-02-07 | Disposition: A | Discharge status: Home / Self Care | Payer: BC Managed Care – PPO | Attending: Urology | Admitting: Urology

## 2021-02-07 ENCOUNTER — Encounter (HOSPITAL_BASED_OUTPATIENT_CLINIC_OR_DEPARTMENT_OTHER)
Admission: RE | Disposition: A | Discharge status: Home / Self Care | Payer: Self-pay | Source: Home / Self Care | Attending: Urology

## 2021-02-07 ENCOUNTER — Ambulatory Visit (HOSPITAL_COMMUNITY): Payer: BC Managed Care – PPO

## 2021-02-07 DIAGNOSIS — N202 Calculus of kidney with calculus of ureter: Secondary | ICD-10-CM | POA: Diagnosis not present

## 2021-02-07 DIAGNOSIS — N201 Calculus of ureter: Secondary | ICD-10-CM | POA: Diagnosis not present

## 2021-02-07 DIAGNOSIS — N200 Calculus of kidney: Secondary | ICD-10-CM

## 2021-02-07 DIAGNOSIS — Z01818 Encounter for other preprocedural examination: Secondary | ICD-10-CM | POA: Diagnosis not present

## 2021-02-07 HISTORY — PX: EXTRACORPOREAL SHOCK WAVE LITHOTRIPSY: SHX1557

## 2021-02-07 HISTORY — DX: Gastro-esophageal reflux disease without esophagitis: K21.9

## 2021-02-07 LAB — POCT PREGNANCY, URINE: Preg Test, Ur: NEGATIVE

## 2021-02-07 SURGERY — LITHOTRIPSY, ESWL
Anesthesia: LOCAL | Laterality: Right

## 2021-02-07 MED ORDER — TRAMADOL HCL 50 MG PO TABS
ORAL_TABLET | ORAL | Status: AC
  Filled 2021-02-07: qty 1

## 2021-02-07 MED ORDER — CIPROFLOXACIN HCL 500 MG PO TABS
ORAL_TABLET | ORAL | Status: AC
  Filled 2021-02-07: qty 1

## 2021-02-07 MED ORDER — TRAMADOL HCL 50 MG PO TABS
50.0000 mg | ORAL_TABLET | Freq: Once | ORAL | Status: AC
  Administered 2021-02-07: 50 mg via ORAL

## 2021-02-07 MED ORDER — SODIUM CHLORIDE 0.9 % IV SOLN: INTRAVENOUS | Status: DC

## 2021-02-07 MED ORDER — DIPHENHYDRAMINE HCL 25 MG PO CAPS
ORAL_CAPSULE | ORAL | Status: AC
  Filled 2021-02-07: qty 1

## 2021-02-07 MED ORDER — DIAZEPAM 5 MG PO TABS
ORAL_TABLET | ORAL | Status: AC
  Filled 2021-02-07: qty 2

## 2021-02-07 MED ORDER — DIPHENHYDRAMINE HCL 25 MG PO CAPS
25.0000 mg | ORAL_CAPSULE | ORAL | Status: AC
  Administered 2021-02-07: 25 mg via ORAL

## 2021-02-07 MED ORDER — DIAZEPAM 5 MG PO TABS
10.0000 mg | ORAL_TABLET | ORAL | Status: AC
  Administered 2021-02-07: 10 mg via ORAL

## 2021-02-07 MED ORDER — CIPROFLOXACIN HCL 500 MG PO TABS
500.0000 mg | ORAL_TABLET | ORAL | Status: AC
  Administered 2021-02-07: 500 mg via ORAL

## 2021-02-07 NOTE — H&P (Signed)
See scanned H&P

## 2021-02-07 NOTE — Op Note (Signed)
See [facility] OP note scanned into chart. Also because of the size, density, location and other factors that cannot be anticipated I feel this will likely be a staged procedure. This fact supersedes any indication in the scanned [facility] operative note to the contrary.  

## 2021-02-07 NOTE — Progress Notes (Signed)
Patient back on unit drowsy and oriented. Patient has petechiae rash on posterior lower right side.

## 2021-02-08 ENCOUNTER — Encounter (HOSPITAL_BASED_OUTPATIENT_CLINIC_OR_DEPARTMENT_OTHER): Payer: Self-pay | Admitting: Urology

## 2021-02-10 DIAGNOSIS — R000 Tachycardia, unspecified: Secondary | ICD-10-CM | POA: Diagnosis not present

## 2021-02-10 DIAGNOSIS — I447 Left bundle-branch block, unspecified: Secondary | ICD-10-CM | POA: Diagnosis not present

## 2021-02-10 DIAGNOSIS — I493 Ventricular premature depolarization: Secondary | ICD-10-CM | POA: Diagnosis not present

## 2021-02-10 DIAGNOSIS — I428 Other cardiomyopathies: Secondary | ICD-10-CM | POA: Diagnosis not present

## 2021-02-14 DIAGNOSIS — L821 Other seborrheic keratosis: Secondary | ICD-10-CM | POA: Diagnosis not present

## 2021-02-14 DIAGNOSIS — D485 Neoplasm of uncertain behavior of skin: Secondary | ICD-10-CM | POA: Diagnosis not present

## 2021-02-14 DIAGNOSIS — D225 Melanocytic nevi of trunk: Secondary | ICD-10-CM | POA: Diagnosis not present

## 2021-02-14 DIAGNOSIS — Z85828 Personal history of other malignant neoplasm of skin: Secondary | ICD-10-CM | POA: Diagnosis not present

## 2021-02-14 DIAGNOSIS — L570 Actinic keratosis: Secondary | ICD-10-CM | POA: Diagnosis not present

## 2021-02-18 DIAGNOSIS — I428 Other cardiomyopathies: Secondary | ICD-10-CM | POA: Diagnosis not present

## 2021-02-18 DIAGNOSIS — Z23 Encounter for immunization: Secondary | ICD-10-CM | POA: Diagnosis not present

## 2021-02-18 DIAGNOSIS — E785 Hyperlipidemia, unspecified: Secondary | ICD-10-CM | POA: Diagnosis not present

## 2021-02-21 DIAGNOSIS — N202 Calculus of kidney with calculus of ureter: Secondary | ICD-10-CM | POA: Diagnosis not present

## 2021-02-22 DIAGNOSIS — L988 Other specified disorders of the skin and subcutaneous tissue: Secondary | ICD-10-CM | POA: Diagnosis not present

## 2021-02-22 DIAGNOSIS — D485 Neoplasm of uncertain behavior of skin: Secondary | ICD-10-CM | POA: Diagnosis not present

## 2021-05-03 DIAGNOSIS — E6689 Other obesity not elsewhere classified: Secondary | ICD-10-CM | POA: Insufficient documentation

## 2021-05-04 DIAGNOSIS — Z87442 Personal history of urinary calculi: Secondary | ICD-10-CM | POA: Diagnosis not present

## 2021-05-04 DIAGNOSIS — E782 Mixed hyperlipidemia: Secondary | ICD-10-CM | POA: Diagnosis not present

## 2021-05-04 DIAGNOSIS — E668 Other obesity: Secondary | ICD-10-CM | POA: Diagnosis not present

## 2021-05-04 DIAGNOSIS — I5022 Chronic systolic (congestive) heart failure: Secondary | ICD-10-CM | POA: Diagnosis not present

## 2021-07-06 DIAGNOSIS — M25562 Pain in left knee: Secondary | ICD-10-CM | POA: Insufficient documentation

## 2021-07-18 DIAGNOSIS — E782 Mixed hyperlipidemia: Secondary | ICD-10-CM | POA: Diagnosis not present

## 2021-07-18 DIAGNOSIS — I5022 Chronic systolic (congestive) heart failure: Secondary | ICD-10-CM | POA: Diagnosis not present

## 2021-07-18 DIAGNOSIS — Z87442 Personal history of urinary calculi: Secondary | ICD-10-CM | POA: Diagnosis not present

## 2021-07-18 DIAGNOSIS — E668 Other obesity: Secondary | ICD-10-CM | POA: Diagnosis not present

## 2021-07-21 DIAGNOSIS — M1712 Unilateral primary osteoarthritis, left knee: Secondary | ICD-10-CM | POA: Insufficient documentation

## 2021-08-23 DIAGNOSIS — E785 Hyperlipidemia, unspecified: Secondary | ICD-10-CM | POA: Diagnosis not present

## 2021-08-30 DIAGNOSIS — E785 Hyperlipidemia, unspecified: Secondary | ICD-10-CM | POA: Diagnosis not present

## 2021-08-30 DIAGNOSIS — Z1331 Encounter for screening for depression: Secondary | ICD-10-CM | POA: Diagnosis not present

## 2021-08-30 DIAGNOSIS — H6982 Other specified disorders of Eustachian tube, left ear: Secondary | ICD-10-CM | POA: Diagnosis not present

## 2021-08-30 DIAGNOSIS — Z0000 Encounter for general adult medical examination without abnormal findings: Secondary | ICD-10-CM | POA: Diagnosis not present

## 2021-08-30 DIAGNOSIS — Z1339 Encounter for screening examination for other mental health and behavioral disorders: Secondary | ICD-10-CM | POA: Diagnosis not present

## 2021-08-30 DIAGNOSIS — Z23 Encounter for immunization: Secondary | ICD-10-CM | POA: Diagnosis not present

## 2021-09-26 ENCOUNTER — Encounter: Payer: Self-pay | Admitting: Cardiovascular Disease

## 2021-09-26 DIAGNOSIS — I5042 Chronic combined systolic (congestive) and diastolic (congestive) heart failure: Secondary | ICD-10-CM

## 2021-09-27 MED ORDER — ENTRESTO 49-51 MG PO TABS: 1.0000 | ORAL_TABLET | Freq: Two times a day (BID) | ORAL | 3 refills | Status: DC

## 2021-09-27 MED ORDER — CARVEDILOL 6.25 MG PO TABS: 6.2500 mg | ORAL_TABLET | Freq: Two times a day (BID) | ORAL | 3 refills | Status: DC

## 2021-09-27 MED ORDER — ROSUVASTATIN CALCIUM 10 MG PO TABS: 10.0000 mg | ORAL_TABLET | Freq: Every day | ORAL | 3 refills | Status: DC

## 2021-09-27 MED ORDER — SPIRONOLACTONE 25 MG PO TABS: 25.0000 mg | ORAL_TABLET | Freq: Every day | ORAL | 3 refills | Status: DC

## 2021-10-06 ENCOUNTER — Ambulatory Visit (HOSPITAL_COMMUNITY): Payer: BC Managed Care – PPO | Attending: Cardiovascular Disease

## 2021-10-06 DIAGNOSIS — I447 Left bundle-branch block, unspecified: Secondary | ICD-10-CM | POA: Insufficient documentation

## 2021-10-06 DIAGNOSIS — I5042 Chronic combined systolic (congestive) and diastolic (congestive) heart failure: Secondary | ICD-10-CM | POA: Insufficient documentation

## 2021-10-06 LAB — ECHOCARDIOGRAM COMPLETE
Area-P 1/2: 5.66 cm2
S' Lateral: 3.9 cm

## 2021-10-09 ENCOUNTER — Encounter: Payer: Self-pay | Admitting: Cardiovascular Disease

## 2021-10-09 NOTE — Progress Notes (Unsigned)
Kendra [name] Date of Birth  09-20-1970 [facility]        [address]    [place]     [place], [place]  [address]      [phone]  Fax  [phone]       Problem List: 1. Left bundle branch block 2. Chest pain 3. Kidney stone 4. Palpitations  5. Chronic  systolic CHF - in the setting of LBBB.  EF 43% by myoview in April  2013.  EF 35% by echo Feb. 2019.      Kendra [name]  is a 51 y.o. female. She presented to her medical Dr. with some upper left-sided abdominal pain/breast pain.  This pain is occasionally pleuritic. She was thought to have a pulmonary illness. A d-dimer was subsequent on and found to be negative the An EKG showed left bundle branch block.   A subsequent echocardiogram revealed dyssynchrony of her septum.  She does not exercise on regular basis. She walks occasionally. She's been in the process of moving. She has noticed that she's a little bit more short of breath moving boxes. She's also noticed some left arm tingling and numbness which radiates down to her fingers.  We performed a Lexiscan Myoview which revealed an EF of 43%.  Overall Impression: Low risk stress nuclear study. No ischemia or infarct. Baseline ECG LBBB with decreased EF  LV Ejection Fraction: 43%. LV Wall Motion: Apical hypokinesis  We initially had her on Toprol-XL but she did not tolerate it. She felt very lethargic. We now have her on carvedilol which he seems to tolerate much better.  She's exercising on occasion.  She has been measuring her BP on occasion.  Her readings have been ok but several have been elevated.  Feb. 27, 2014:  She has had several kidney surgeries since I last saw her.  She had some chest pressure when she woke up for anesthesia. She has had some bad indigestion since then.  Better with antiacids.  She has not been exercising much for the past month.  Her BP has been well controlled.    Nov. 10, 2014:  She is doing well. Having some PVCs on occasion.   She has been walking some.  She has had occaisonal ankle swelling.    No edema today.  She has a new job.   April 22, 2013:    Kendra [name]  presents today for further evaluation of palpitations. She's been under a little bit more stress than usual. She's also been fatigued.   We placed a Holter monitor. She had sinus tachycardia as well as sinus bradycardia. She has an underlying bundle branch block. There were no arrhythmias to suggest SVT   July 07, 2013:  [name] is doing well.  Able to do all of her normal activities without problems. Trying to exercise.  She has not had many palpitations.  Sleeping better.    Wants to delay getting the sleep study.  July 28, 2014:  Doing great . Not many palpitations  Exercising  - walking several times a week .   Doing great.  Has lost 8 lbs.   Sept. 1, 2017:  Doing well No CP , no dyspnea Walking ,  Has lost 9 lbs since her last visit .   February 06, 2017:  Feeling well. Has gained some weight.    Works in [employer]  ( [employer])   No CP   Not avoiding salt .  Feb. 5, 2019:  Kendra [name] is seen back for her chronic systolic congestive heart failure, left bundle branch block. Echocardiogram from January 21 reveals reduced left ventricular systolic function with an EF of 35%.   We added Entresto 24-26 mg twice a day. Has not been exercising   Feb. 28, 2019 Seen with husband, Kendra [name] Has had some low BP readings  Since adding Entresto  Has had 2-3 episodes of lightheadedness -  Starting to exercise but she has lots of DOE .  Has noticed some symptoms with exercise since she is short of breath .  May 08, 2017:  Kendra [name] is seen back for eval of her chf and LBBB  Started on Entresto 49-51  - tolerating it well Had a myoview - which revealed septal defect - thought to be due to LBBB .   Coronary CT angiogram was normal  Still having some DOE  Climbing stairs  No near syncope ,   Tolerating meds well  Jun 15, 2017:   Kendra [name] is seen today for  follow-up of her chronic systolic congestive heart failure. We increased her carvedilol to 6.25 mg twice a day at her last visit. Wt = 227,  Tolerating the coreg well  Still has some shortness of breath .   Sept. 14, 2021  Kendra [name] is seen today for follow up of her chf. Had a vidio visit last year Echo from 2020 shows normal EF of 50-55% Some dyssynchrony from LBBB Feeling well Has some indigestion Has had some feet swelling  Had bronchitis several weeks ago , has been on some medrol dose packs   Has not had the covid vaccine yet. Had covid in Nov. 2020  Sept. 12, 2022: Kendra [name] is seen today for follow up of her CHF Has LBBB with dyssynchrony Wt is 233 lbs.  No CP , no dyspnea  Doing some yoga  Does not like to walk ,  likes to work out in the AM .   Sept. 11, 2023 Seen with husband, Kendra [name],  Kendra [name] is seen for follow up of her LBBB   echo  10/06/21 - shows mildly reduced LVEF  40-45% Grade I DD Trivial MR  Wt is 198 lbs  Is taking Wegovy    Coronary CTA April 2019 -  CAC = 0 Normal coronaries  On Coreg 625 BID Entresto 49-51 BID  Spironolactone 25 QD  start Farxiga    Will get cardiac MRI      Current Outpatient Medications on File Prior to Visit  Medication Sig Dispense Refill   carvedilol (COREG) 6.25 MG tablet Take 1 tablet (6.25 mg total) by mouth 2 (two) times daily with a meal. 180 tablet 3   ibuprofen (ADVIL,MOTRIN) 200 MG tablet Take 800 mg by mouth daily as needed for moderate pain.     levonorgestrel (LILETTA) 19.5 MCG/DAY IUD IUD Liletta 19.5 mcg/24 hrs (5 yrs) 52 mg intrauterine device  Take 1 device by intrauterine route.     rosuvastatin (CRESTOR) 10 MG tablet Take 1 tablet (10 mg total) by mouth daily. 90 tablet 3   sacubitril-valsartan (ENTRESTO) 49-51 MG Take 1 tablet by mouth 2 (two) times daily. 180 tablet 3   spironolactone (ALDACTONE) 25 MG tablet Take 1 tablet (25 mg total) by mouth daily. 90 tablet 3   ketorolac (TORADOL) 10 MG tablet  Take 10 mg by mouth every 6 (six) hours as needed for moderate pain. (Patient not taking: Reported on 10/10/2021)     vitamin B-12 (CYANOCOBALAMIN) 500  MCG tablet Take 500 mcg by mouth daily. (Patient not taking: Reported on 10/10/2021)     WEGOVY 2.4 MG/0.75ML SOAJ Inject 2.4 mg into the skin once a week.     No current facility-administered medications on file prior to visit.    Allergies  Allergen Reactions   Codeine Nausea And Vomiting   Penicillins Hives    Did it involve swelling of the face/tongue/throat, SOB, or low BP? No Did it involve sudden or severe rash/hives, skin peeling, or any reaction on the inside of your mouth or nose? No Did you need to seek medical attention at a hospital or doctor's office? No When did it last happen?      Childhood allergy If all above answers are "NO", may proceed with cephalosporin use.     Past Medical History:  Diagnosis Date   Allergy    Chronic headache    Depression    Former smoker    quit in 41   GERD (gastroesophageal reflux disease)    History of kidney stones    Hyperlipidemia    Left bundle branch block    Obesity    Systolic dysfunction, left ventricle    EF is 43% per Myoview; dyssynchrony of the septum noted on echo; has LBBB    Past Surgical History:  Procedure Laterality Date   CHOLECYSTECTOMY  2009   CYSTOSCOPY WITH RETROGRADE PYELOGRAM, URETEROSCOPY AND STENT PLACEMENT  03/06/2012   Procedure: CYSTOSCOPY WITH RETROGRADE PYELOGRAM, URETEROSCOPY AND STENT PLACEMENT;  Surgeon: Molli Hazard, MD;  Location: [facility];  Service: Urology;  Laterality: Left;   CYSTOSCOPY WITH RETROGRADE PYELOGRAM, URETEROSCOPY AND STENT PLACEMENT Left 03/20/2012   Procedure: CYSTOSCOPY WITH RETROGRADE PYELOGRAM, URETEROSCOPY AND STENT PLACEMENT;  Surgeon: Molli Hazard, MD;  Location: [facility];  Service: Urology;  Laterality: Left;  balloon dilitation   CYSTOSCOPY/URETEROSCOPY/HOLMIUM  LASER/STENT PLACEMENT Left 03/08/2018   Procedure: CYSTOSCOPY/RETROGRADE/URETEROSCOPY/STENT PLACEMENT;  Surgeon: Ceasar Mons, MD;  Location: [facility];  Service: Urology;  Laterality: Left;   EXTRACORPOREAL SHOCK WAVE LITHOTRIPSY Left 01/24/2018   Procedure: EXTRACORPOREAL SHOCK WAVE LITHOTRIPSY (ESWL);  Surgeon: Alexis Frock, MD;  Location: [facility];  Service: Urology;  Laterality: Left;   EXTRACORPOREAL SHOCK WAVE LITHOTRIPSY Right 02/07/2021   Procedure: EXTRACORPOREAL SHOCK WAVE LITHOTRIPSY (ESWL);  Surgeon: Lucas Mallow, MD;  Location: [facility];  Service: Urology;  Laterality: Right;   FOOT SURGERY  12/2005   HOLMIUM LASER APPLICATION Left 10/14/7827   Procedure: HOLMIUM LASER APPLICATION;  Surgeon: Molli Hazard, MD;  Location: [facility];  Service: Urology;  Laterality: Left;   TONSILLECTOMY  03/2010   WRIST SURGERY  11/2001    Social History   Tobacco Use  Smoking Status Former   Types: Cigarettes   Quit date: 01/31/1991   Years since quitting: 30.7  Smokeless Tobacco Never    Social History   Substance and Sexual Activity  Alcohol Use Yes   Comment: occ    Family History  Problem Relation Age of Onset   Hyperlipidemia Mother    Hyperlipidemia Brother    Hyperlipidemia Brother    Colon cancer Neg Hx     Reviw of Systems: Noted in current history, otherwise review of systems is negative.   Physical Exam: Blood pressure 108/79, pulse 93, height '5\' 10"'$  (1.778 m), weight 198 lb 9.6 oz (90.1 kg), SpO2 99 %.       GEN:  Well nourished, well developed in no  acute distress HEENT: Normal NECK: No JVD; No carotid bruits LYMPHATICS: No lymphadenopathy CARDIAC: RRR , no murmurs, rubs, gallops RESPIRATORY:  Clear to auscultation without rales, wheezing or rhonchi  ABDOMEN: Soft, non-tender, non-distended MUSCULOSKELETAL:  No edema; No deformity  SKIN: Warm and dry NEUROLOGIC:  Alert and oriented x 3    ECG:      October 10, 2021: Normal sinus rhythm at 93.  Left bundle branch block.   Assessment / Plan:   1. Left bundle branch block -   most recent echo shows an EF of 40 to 45%.  That is decreased from her previous normal value. She has evidence of dyssynchrony on her echocardiogram. We will get cardiac MRI for further evaluation.  2.   Chronic  Combined systolic  /  diastolic  CHF : She is on carvedilol, Entresto, spironolactone.  We will add Farxiga 10 mg a day.  We will get a cardiac MRI for further assessment of her LV dysfunction.  She had a normal coronary CT angiogram in 2019.  3.   HLD -      4. Palpitations -         Mertie Moores, MD  10/11/2021 7:51 AM    [address],  [place], [place]  [address] Pager [phone] Phone: [phone]; Fax: [phone]

## 2021-10-10 ENCOUNTER — Ambulatory Visit: Payer: BC Managed Care – PPO | Attending: Cardiovascular Disease | Admitting: Cardiovascular Disease

## 2021-10-10 ENCOUNTER — Encounter: Payer: Self-pay | Admitting: Cardiovascular Disease

## 2021-10-10 VITALS — BP 108/79 | HR 93 | Ht 70.0 in | Wt 198.6 lb

## 2021-10-10 DIAGNOSIS — I5022 Chronic systolic (congestive) heart failure: Secondary | ICD-10-CM | POA: Diagnosis not present

## 2021-10-10 DIAGNOSIS — I447 Left bundle-branch block, unspecified: Secondary | ICD-10-CM | POA: Diagnosis not present

## 2021-10-10 MED ORDER — DAPAGLIFLOZIN PROPANEDIOL 10 MG PO TABS: 10.0000 mg | ORAL_TABLET | Freq: Every day | ORAL | 3 refills | Status: DC

## 2021-10-10 NOTE — Patient Instructions (Signed)
Medication Instructions:  Your physician has recommended you make the following change in your medication:  Farxiga '10mg'$  daily *If you need a refill on your cardiac medications before your next appointment, please call your pharmacy*   Lab Work: CBC, BMet If you have labs (blood work) drawn today and your tests are completely normal, you will receive your results only by: [place] (if you have MyChart) OR A paper copy in the mail If you have any lab test that is abnormal or we need to change your treatment, we will call you to review the results.   Testing/Procedures: Your physician has requested that you have a cardiac MRI. Cardiac MRI uses a computer to create images of your heart as its beating, producing both still and moving pictures of your heart and major blood vessels. For further information please visit [email or web address] Please follow the instruction sheet given to you today for more information.    Follow-Up: At [facility], you and your health needs are our priority.  As part of our continuing mission to provide you with exceptional heart care, we have created designated Provider Care Teams.  These Care Teams include your primary Cardiologist (physician) and Advanced Practice Providers (APPs -  Physician Assistants and Nurse Practitioners) who all work together to provide you with the care you need, when you need it.   Your next appointment:   3 month(s)  The format for your next appointment:   In Person  Provider:   Mertie Moores, MD    Other Instructions   You are scheduled for Cardiac MRI on ______________. Please arrive for your appointment at ______________ ( arrive 30-45 minutes prior to test start time). ?  [facility] [address] [place], [place] [address] [phone] Please take advantage of the free valet parking available at the MAIN entrance (A entrance).  Proceed to the [facility] Radiology Department (First Floor)  for check-in.     Magnetic resonance imaging (MRI) is a painless test that produces images of the inside of the body without using Xrays.  During an MRI, strong magnets and radio waves work together in a [employer] officer, political party to form detailed images.   MRI images may provide more details about a medical condition than X-rays, CT scans, and ultrasounds can provide.  You may be given earphones to listen for instructions.  You may eat a light breakfast and take medications as ordered with the exception of HCTZ (fluid pill, other). Please avoid stimulants for 12 hr prior to test. (Ie. Caffeine, nicotine, chocolate, or antihistamine medications)  If a contrast material will be used, an IV will be inserted into one of your veins. Contrast material will be injected into your IV. It will leave your body through your urine within a day. You may be told to drink plenty of fluids to help flush the contrast material out of your system.  You will be asked to remove all metal, including: Watch, jewelry, and other metal objects including hearing aids, hair pieces and dentures. Also wearable glucose monitoring systems (ie. Freestyle Libre and Omnipods) (Braces and fillings normally are not a problem.)   TEST WILL TAKE APPROXIMATELY 1 HOUR  PLEASE NOTIFY SCHEDULING AT LEAST 24 HOURS IN ADVANCE IF YOU ARE UNABLE TO KEEP YOUR APPOINTMENT. [phone]  Please call Marchia Bond, cardiac imaging nurse navigator with any questions/concerns. Marchia Bond RN Navigator Cardiac Imaging Gordy Clement RN Navigator Cardiac Imaging [facility] [phone] Office

## 2021-11-21 ENCOUNTER — Other Ambulatory Visit: Payer: Self-pay

## 2021-11-21 DIAGNOSIS — Z87442 Personal history of urinary calculi: Secondary | ICD-10-CM | POA: Diagnosis not present

## 2021-11-21 DIAGNOSIS — I5022 Chronic systolic (congestive) heart failure: Secondary | ICD-10-CM | POA: Diagnosis not present

## 2021-11-21 DIAGNOSIS — E668 Other obesity: Secondary | ICD-10-CM | POA: Diagnosis not present

## 2021-11-21 DIAGNOSIS — E782 Mixed hyperlipidemia: Secondary | ICD-10-CM | POA: Diagnosis not present

## 2021-11-21 DIAGNOSIS — I5042 Chronic combined systolic (congestive) and diastolic (congestive) heart failure: Secondary | ICD-10-CM

## 2021-11-21 MED ORDER — SPIRONOLACTONE 25 MG PO TABS: 25.0000 mg | ORAL_TABLET | Freq: Every day | ORAL | 3 refills | Status: DC

## 2021-11-21 MED ORDER — CARVEDILOL 6.25 MG PO TABS: 6.2500 mg | ORAL_TABLET | Freq: Two times a day (BID) | ORAL | 3 refills | Status: DC

## 2021-11-21 MED ORDER — ROSUVASTATIN CALCIUM 10 MG PO TABS: 10.0000 mg | ORAL_TABLET | Freq: Every day | ORAL | 3 refills | Status: DC

## 2021-11-21 NOTE — Addendum Note (Signed)
Addended by: Carter Kitten D on: 11/21/2021 08:27 AM   Modules accepted: Orders

## 2021-12-05 ENCOUNTER — Telehealth: Payer: Self-pay | Admitting: Cardiovascular Disease

## 2021-12-05 MED ORDER — EMPAGLIFLOZIN 10 MG PO TABS: 10.0000 mg | ORAL_TABLET | Freq: Every day | ORAL | 11 refills | Status: DC

## 2021-12-05 NOTE — Telephone Encounter (Signed)
Received a fax from [facility] stating that pt's insurance did not cover Farxiga and want step therapy, with initial medication initiation of Metformin. Medication is being prescribed for heart failure, not diabetes. Spoke with Dr Acie Fredrickson during clinic who recommended we send in [place] '10mg'$  daily to replace this and see if they cover that. Rx sent to pharmacy on file.

## 2021-12-26 DIAGNOSIS — R051 Acute cough: Secondary | ICD-10-CM | POA: Diagnosis not present

## 2021-12-26 DIAGNOSIS — J209 Acute bronchitis, unspecified: Secondary | ICD-10-CM | POA: Diagnosis not present

## 2021-12-27 ENCOUNTER — Encounter: Payer: Self-pay | Admitting: Cardiovascular Disease

## 2021-12-28 ENCOUNTER — Ambulatory Visit: Payer: BC Managed Care – PPO | Attending: Cardiovascular Disease

## 2021-12-28 DIAGNOSIS — I5022 Chronic systolic (congestive) heart failure: Secondary | ICD-10-CM

## 2021-12-28 DIAGNOSIS — I447 Left bundle-branch block, unspecified: Secondary | ICD-10-CM

## 2021-12-29 ENCOUNTER — Telehealth (HOSPITAL_COMMUNITY): Payer: Self-pay | Admitting: *Deleted

## 2021-12-29 LAB — CBC
Hematocrit: 41.7 % (ref 34.0–46.6)
Hemoglobin: 14.1 g/dL (ref 11.1–15.9)
MCH: 30.3 pg (ref 26.6–33.0)
MCHC: 33.8 g/dL (ref 31.5–35.7)
MCV: 90 fL (ref 79–97)
Platelets: 308 10*3/uL (ref 150–450)
RBC: 4.66 x10E6/uL (ref 3.77–5.28)
RDW: 12.2 % (ref 11.7–15.4)
WBC: 10.3 10*3/uL (ref 3.4–10.8)

## 2021-12-29 LAB — BASIC METABOLIC PANEL
BUN/Creatinine Ratio: 14 (ref 9–23)
BUN: 11 mg/dL (ref 6–24)
CO2: 24 mmol/L (ref 20–29)
Calcium: 9.7 mg/dL (ref 8.7–10.2)
Chloride: 102 mmol/L (ref 96–106)
Creatinine, Ser: 0.8 mg/dL (ref 0.57–1.00)
Glucose: 103 mg/dL — ABNORMAL HIGH (ref 70–99)
Potassium: 5 mmol/L (ref 3.5–5.2)
Sodium: 139 mmol/L (ref 134–144)
eGFR: 90 mL/min/{1.73_m2} (ref >59)

## 2021-12-29 NOTE — Telephone Encounter (Signed)
Attempted to call patient regarding upcoming cardiac MRI appointment. Left message on voicemail with name and callback number  Warwick Nick RN Navigator Cardiac Imaging [facility] [phone] Office [phone] Cell  

## 2021-12-30 ENCOUNTER — Ambulatory Visit (HOSPITAL_COMMUNITY)
Admission: RE | Admit: 2021-12-30 | Discharge: 2021-12-30 | Disposition: A | Discharge status: Home / Self Care | Payer: BC Managed Care – PPO | Source: Ambulatory Visit | Attending: Cardiovascular Disease | Admitting: Cardiovascular Disease

## 2021-12-30 ENCOUNTER — Other Ambulatory Visit: Payer: Self-pay | Admitting: Cardiovascular Disease

## 2021-12-30 DIAGNOSIS — I447 Left bundle-branch block, unspecified: Secondary | ICD-10-CM | POA: Diagnosis not present

## 2021-12-30 DIAGNOSIS — I5022 Chronic systolic (congestive) heart failure: Secondary | ICD-10-CM

## 2021-12-30 MED ORDER — GADOBUTROL 1 MMOL/ML IV SOLN
10.0000 mL | Freq: Once | INTRAVENOUS | Status: AC | PRN
  Administered 2021-12-30: 10 mL via INTRAVENOUS

## 2022-01-05 DIAGNOSIS — Z1231 Encounter for screening mammogram for malignant neoplasm of breast: Secondary | ICD-10-CM | POA: Diagnosis not present

## 2022-01-15 ENCOUNTER — Encounter: Payer: Self-pay | Admitting: Cardiovascular Disease

## 2022-01-15 NOTE — Progress Notes (Unsigned)
Kendra [name] Date of Birth  09-20-1970 [facility]        [address]    [place]     [place], [place]  [address]      [phone]  Fax  [phone]       Problem List: 1. Left bundle branch block 2. Chest pain 3. Kidney stone 4. Palpitations  5. Chronic  systolic CHF - in the setting of LBBB.  EF 43% by myoview in April  2013.  EF 35% by echo Feb. 2019.      Kendra [name]  is a 51 y.o. female. She presented to her medical Dr. with some upper left-sided abdominal pain/breast pain.  This pain is occasionally pleuritic. She was thought to have a pulmonary illness. A d-dimer was subsequent on and found to be negative the An EKG showed left bundle branch block.   A subsequent echocardiogram revealed dyssynchrony of her septum.  She does not exercise on regular basis. She walks occasionally. She's been in the process of moving. She has noticed that she's a little bit more short of breath moving boxes. She's also noticed some left arm tingling and numbness which radiates down to her fingers.  We performed a Lexiscan Myoview which revealed an EF of 43%.  Overall Impression: Low risk stress nuclear study. No ischemia or infarct. Baseline ECG LBBB with decreased EF  LV Ejection Fraction: 43%. LV Wall Motion: Apical hypokinesis  We initially had her on Toprol-XL but she did not tolerate it. She felt very lethargic. We now have her on carvedilol which he seems to tolerate much better.  She's exercising on occasion.  She has been measuring her BP on occasion.  Her readings have been ok but several have been elevated.  Feb. 27, 2014:  She has had several kidney surgeries since I last saw her.  She had some chest pressure when she woke up for anesthesia. She has had some bad indigestion since then.  Better with antiacids.  She has not been exercising much for the past month.  Her BP has been well controlled.    Nov. 10, 2014:  She is doing well. Having some PVCs on occasion.   She has been walking some.  She has had occaisonal ankle swelling.    No edema today.  She has a new job.   April 22, 2013:    Kendra [name]  presents today for further evaluation of palpitations. She's been under a little bit more stress than usual. She's also been fatigued.   We placed a Holter monitor. She had sinus tachycardia as well as sinus bradycardia. She has an underlying bundle branch block. There were no arrhythmias to suggest SVT   July 07, 2013:  [name] is doing well.  Able to do all of her normal activities without problems. Trying to exercise.  She has not had many palpitations.  Sleeping better.    Wants to delay getting the sleep study.  July 28, 2014:  Doing great . Not many palpitations  Exercising  - walking several times a week .   Doing great.  Has lost 8 lbs.   Sept. 1, 2017:  Doing well No CP , no dyspnea Walking ,  Has lost 9 lbs since her last visit .   February 06, 2017:  Feeling well. Has gained some weight.    Works in [employer]  ( [employer])   No CP   Not avoiding salt .  Feb. 5, 2019:  Kendra [name] is seen back for her chronic systolic congestive heart failure, left bundle branch block. Echocardiogram from January 21 reveals reduced left ventricular systolic function with an EF of 35%.   We added Entresto 24-26 mg twice a day. Has not been exercising   Feb. 28, 2019 Seen with husband, Kendra [name] Has had some low BP readings  Since adding Entresto  Has had 2-3 episodes of lightheadedness -  Starting to exercise but she has lots of DOE .  Has noticed some symptoms with exercise since she is short of breath .  May 08, 2017:  Kendra [name] is seen back for eval of her chf and LBBB  Started on Entresto 49-51  - tolerating it well Had a myoview - which revealed septal defect - thought to be due to LBBB .   Coronary CT angiogram was normal  Still having some DOE  Climbing stairs  No near syncope ,   Tolerating meds well  Jun 15, 2017:   Kendra [name] is seen today for  follow-up of her chronic systolic congestive heart failure. We increased her carvedilol to 6.25 mg twice a day at her last visit. Wt = 227,  Tolerating the coreg well  Still has some shortness of breath .   Sept. 14, 2021  Kendra [name] is seen today for follow up of her chf. Had a vidio visit last year Echo from 2020 shows normal EF of 50-55% Some dyssynchrony from LBBB Feeling well Has some indigestion Has had some feet swelling  Had bronchitis several weeks ago , has been on some medrol dose packs   Has not had the covid vaccine yet. Had covid in Nov. 2020  Sept. 12, 2022: Kendra [name] is seen today for follow up of her CHF Has LBBB with dyssynchrony Wt is 233 lbs.  No CP , no dyspnea  Doing some yoga  Does not like to walk ,  likes to work out in the AM .   Sept. 11, 2023 Seen with husband, Kendra [name],  Kendra [name] is seen for follow up of her LBBB   echo  10/06/21 - shows mildly reduced LVEF  40-45% Grade I DD Trivial MR  Wt is 198 lbs  Is taking Wegovy    Coronary CTA April 2019 -  CAC = 0 Normal coronaries  On Coreg 625 BID Entresto 49-51 BID  Spironolactone 25 QD  start Wilder Glade    Will get cardiac MRI   Dec. 18, 2023 Kendra [name] is seen for follow up of her CHF, LBBB Wt is 200 lbs   Cardiac MRI reveals EF of 46% There is no late gadolinium enhancement to suggest scarring  There is an increase in signal in the posterior lateral aspect of the left breast  The radiologist recommendation is to follow up with yearly mammograms   She followed up with a mammogram - every thing is clear      Did not tolerate the [place].   Had dysuria , some itching  She stopped it   Is on coreg 6.25 BID ,  will increase to 12.5 BID Entresto 49-51 Spironolactone 25         Current Outpatient Medications on File Prior to Visit  Medication Sig Dispense Refill   carvedilol (COREG) 6.25 MG tablet Take 1 tablet (6.25 mg total) by mouth 2 (two) times daily with a meal. 180 tablet 3    ibuprofen (ADVIL,MOTRIN) 200 MG tablet Take 800 mg by mouth daily as needed for moderate pain.  levonorgestrel (LILETTA) 19.5 MCG/DAY IUD IUD Liletta 19.5 mcg/24 hrs (5 yrs) 52 mg intrauterine device  Take 1 device by intrauterine route.     rosuvastatin (CRESTOR) 10 MG tablet Take 1 tablet (10 mg total) by mouth daily. 90 tablet 3   sacubitril-valsartan (ENTRESTO) 49-51 MG Take 1 tablet by mouth 2 (two) times daily. 180 tablet 3   spironolactone (ALDACTONE) 25 MG tablet Take 1 tablet (25 mg total) by mouth daily. 90 tablet 3   WEGOVY 2.4 MG/0.75ML SOAJ Inject 2.4 mg into the skin once a week.     empagliflozin (JARDIANCE) 10 MG TABS tablet Take 1 tablet (10 mg total) by mouth daily before breakfast. (Patient not taking: Reported on 01/16/2022) 30 tablet 11   ketorolac (TORADOL) 10 MG tablet Take 10 mg by mouth every 6 (six) hours as needed for moderate pain. (Patient not taking: Reported on 01/16/2022)     vitamin B-12 (CYANOCOBALAMIN) 500 MCG tablet Take 500 mcg by mouth daily. (Patient not taking: Reported on 01/16/2022)     No current facility-administered medications on file prior to visit.    Allergies  Allergen Reactions   Codeine Nausea And Vomiting   Penicillins Hives    Did it involve swelling of the face/tongue/throat, SOB, or low BP? No Did it involve sudden or severe rash/hives, skin peeling, or any reaction on the inside of your mouth or nose? No Did you need to seek medical attention at a hospital or doctor's office? No When did it last happen?      Childhood allergy If all above answers are "NO", may proceed with cephalosporin use.     Past Medical History:  Diagnosis Date   Allergy    Chronic headache    Depression    Former smoker    quit in 32   GERD (gastroesophageal reflux disease)    History of kidney stones    Hyperlipidemia    Left bundle branch block    Obesity    Systolic dysfunction, left ventricle    EF is 43% per Myoview; dyssynchrony of the  septum noted on echo; has LBBB    Past Surgical History:  Procedure Laterality Date   CHOLECYSTECTOMY  2009   CYSTOSCOPY WITH RETROGRADE PYELOGRAM, URETEROSCOPY AND STENT PLACEMENT  03/06/2012   Procedure: CYSTOSCOPY WITH RETROGRADE PYELOGRAM, URETEROSCOPY AND STENT PLACEMENT;  Surgeon: Molli Hazard, MD;  [facility]: [facility];  Service: Urology;  Laterality: Left;   CYSTOSCOPY WITH RETROGRADE PYELOGRAM, URETEROSCOPY AND STENT PLACEMENT Left 03/20/2012   Procedure: CYSTOSCOPY WITH RETROGRADE PYELOGRAM, URETEROSCOPY AND STENT PLACEMENT;  Surgeon: Molli Hazard, MD;  Location: [facility];  Service: Urology;  Laterality: Left;  balloon dilitation   CYSTOSCOPY/URETEROSCOPY/HOLMIUM LASER/STENT PLACEMENT Left 03/08/2018   Procedure: CYSTOSCOPY/RETROGRADE/URETEROSCOPY/STENT PLACEMENT;  Surgeon: Ceasar Mons, MD;  Location: [facility];  Service: Urology;  Laterality: Left;   EXTRACORPOREAL SHOCK WAVE LITHOTRIPSY Left 01/24/2018   Procedure: EXTRACORPOREAL SHOCK WAVE LITHOTRIPSY (ESWL);  Surgeon: Alexis Frock, MD;  Location: [facility];  Service: Urology;  Laterality: Left;   EXTRACORPOREAL SHOCK WAVE LITHOTRIPSY Right 02/07/2021   Procedure: EXTRACORPOREAL SHOCK WAVE LITHOTRIPSY (ESWL);  Surgeon: Lucas Mallow, MD;  Location: [facility];  Service: Urology;  Laterality: Right;   FOOT SURGERY  12/2005   HOLMIUM LASER APPLICATION Left 2/99/2426   Procedure: HOLMIUM LASER APPLICATION;  Surgeon: Molli Hazard, MD;  Location: [facility];  Service: Urology;  Laterality: Left;   TONSILLECTOMY  03/2010  WRIST SURGERY  11/2001    Social History   Tobacco Use  Smoking Status Former   Types: Cigarettes   Quit date: 01/31/1991   Years since quitting: 30.9  Smokeless Tobacco Never    Social History   Substance and Sexual Activity  Alcohol Use Yes   Comment: occ    Family History  Problem Relation Age of  Onset   Hyperlipidemia Mother    Hyperlipidemia Brother    Hyperlipidemia Brother    Colon cancer Neg Hx     Reviw of Systems: Noted in current history, otherwise review of systems is negative.   Physical Exam: Blood pressure 132/78, pulse 96, height '5\' 10"'$  (1.778 m), weight 200 lb 9.6 oz (91 kg), SpO2 99 %.       GEN:  Well nourished, well developed in no acute distress HEENT: Normal NECK: No JVD; No carotid bruits LYMPHATICS: No lymphadenopathy CARDIAC: RRR , no murmurs, rubs, gallops RESPIRATORY:  Clear to auscultation without rales, wheezing or rhonchi  ABDOMEN: Soft, non-tender, non-distended MUSCULOSKELETAL:  No edema; No deformity  SKIN: Warm and dry NEUROLOGIC:  Alert and oriented x 3     ECG:         Assessment / Plan:   1. Left bundle branch block -   most recent echo shows an EF of 40 to 45%.   Increase coreg to 12.5 BID ,   She did not tolerate the Jardiance - will DC  Cont spiro 25 Entresto 49-51 BID   2.   Chronic  Combined systolic  /  diastolic  CHF :  Increase coreg to 12.5 BID ,   She did not tolerate the Jardiance - will DC  Cont spiro 25 Entresto 49-51 BID   Will see her in 6 months   3.   HLD -      4. Palpitations -         Mertie Moores, MD  01/16/2022 9:46 AM    [facility] [place],  [place], [place]  [address] Pager [phone] Phone: [phone]; Fax: [phone]

## 2022-01-16 ENCOUNTER — Encounter: Payer: Self-pay | Admitting: Cardiovascular Disease

## 2022-01-16 ENCOUNTER — Ambulatory Visit: Payer: BC Managed Care – PPO | Attending: Cardiovascular Disease | Admitting: Cardiovascular Disease

## 2022-01-16 VITALS — BP 132/78 | HR 96 | Ht 70.0 in | Wt 200.6 lb

## 2022-01-16 DIAGNOSIS — I5042 Chronic combined systolic (congestive) and diastolic (congestive) heart failure: Secondary | ICD-10-CM | POA: Diagnosis not present

## 2022-01-16 DIAGNOSIS — I447 Left bundle-branch block, unspecified: Secondary | ICD-10-CM

## 2022-01-16 MED ORDER — CARVEDILOL 12.5 MG PO TABS: 12.5000 mg | ORAL_TABLET | Freq: Two times a day (BID) | ORAL | 3 refills | Status: DC

## 2022-01-16 NOTE — Patient Instructions (Signed)
Medication Instructions:  STOP Jardiance INCREASE Carvedilol (Coreg) to 12.'5mg'$  twice daily *If you need a refill on your cardiac medications before your next appointment, please call your pharmacy*   Lab Work: NONE If you have labs (blood work) drawn today and your tests are completely normal, you will receive your results only by: [place] (if you have MyChart) OR A paper copy in the mail If you have any lab test that is abnormal or we need to change your treatment, we will call you to review the results.   Testing/Procedures: NONE   Follow-Up: At [facility], you and your health needs are our priority.  As part of our continuing mission to provide you with exceptional heart care, we have created designated Provider Care Teams.  These Care Teams include your primary Cardiologist (physician) and Advanced Practice Providers (APPs -  Physician Assistants and Nurse Practitioners) who all work together to provide you with the care you need, when you need it.  We recommend signing up for the patient portal called "MyChart".  Sign up information is provided on this After Visit Summary.  MyChart is used to connect with patients for Virtual Visits (Telemedicine).  Patients are able to view lab/test results, encounter notes, upcoming appointments, etc.  Non-urgent messages can be sent to your provider as well.   To learn more about what you can do with MyChart, go to [email or web address].    Your next appointment:   6 month(s)  The format for your next appointment:   In Person  Provider:   Mertie Moores, MD       Important Information About Sugar

## 2022-03-02 DIAGNOSIS — J069 Acute upper respiratory infection, unspecified: Secondary | ICD-10-CM | POA: Diagnosis not present

## 2022-03-02 DIAGNOSIS — R062 Wheezing: Secondary | ICD-10-CM | POA: Diagnosis not present

## 2022-03-06 DIAGNOSIS — J209 Acute bronchitis, unspecified: Secondary | ICD-10-CM | POA: Diagnosis not present

## 2022-03-20 ENCOUNTER — Encounter: Payer: Self-pay | Admitting: Cardiovascular Disease

## 2022-03-20 NOTE — Progress Notes (Unsigned)
  No show / patient cancelled   This encounter was created in error - please disregard.

## 2022-03-21 ENCOUNTER — Encounter: Payer: BC Managed Care – PPO | Admitting: Cardiovascular Disease

## 2022-04-06 ENCOUNTER — Telehealth: Payer: Self-pay | Admitting: Cardiovascular Disease

## 2022-04-06 MED ORDER — CARVEDILOL 12.5 MG PO TABS: 12.5000 mg | ORAL_TABLET | Freq: Two times a day (BID) | ORAL | 2 refills | Status: DC

## 2022-04-06 NOTE — Telephone Encounter (Signed)
Refill for Carvedilol 12.5 bid has been sent to [facility], per pt request.

## 2022-04-06 NOTE — Telephone Encounter (Signed)
*  STAT* If patient is at the pharmacy, call can be transferred to refill team.  Reference# [id number]   1. Which medications need to be refilled? (please list name of each medication and dose if known) Carvedilol- what is the correct milligram for the Carvedilol  2. Which pharmacy/location (including street and city if local pharmacy) is medication to be sent to? [facility]  3. Do they need a 30 day or 90 day supply? 90 days and refill

## 2022-04-24 DIAGNOSIS — I5022 Chronic systolic (congestive) heart failure: Secondary | ICD-10-CM | POA: Diagnosis not present

## 2022-04-24 DIAGNOSIS — Z87442 Personal history of urinary calculi: Secondary | ICD-10-CM | POA: Diagnosis not present

## 2022-04-24 DIAGNOSIS — E668 Other obesity: Secondary | ICD-10-CM | POA: Diagnosis not present

## 2022-04-24 DIAGNOSIS — E782 Mixed hyperlipidemia: Secondary | ICD-10-CM | POA: Diagnosis not present

## 2022-04-26 ENCOUNTER — Encounter: Payer: Self-pay | Admitting: Gastroenterology

## 2022-05-26 ENCOUNTER — Encounter: Payer: Self-pay | Admitting: Gastroenterology

## 2022-05-26 ENCOUNTER — Ambulatory Visit (AMBULATORY_SURGERY_CENTER): Payer: BC Managed Care – PPO

## 2022-05-26 VITALS — Ht 70.0 in | Wt 210.0 lb

## 2022-05-26 DIAGNOSIS — Z1211 Encounter for screening for malignant neoplasm of colon: Secondary | ICD-10-CM

## 2022-05-26 MED ORDER — NA SULFATE-K SULFATE-MG SULF 17.5-3.13-1.6 GM/177ML PO SOLN: 1.0000 | Freq: Once | ORAL | 0 refills | Status: AC

## 2022-05-26 NOTE — Progress Notes (Signed)
No egg or soy allergy known to patient  No issues known to pt with past sedation with any surgeries or procedures Patient denies ever being told they had issues or difficulty with intubation  No FH of Malignant Hyperthermia Pt is not on diet pills Pt is not on  home 02  Pt is not on blood thinners  Pt denies issues with constipation  No A fib or A flutter Have any cardiac testing pending--no Pt instructed to use [email or web address] or GoodRx for a price reduction on prep   

## 2022-06-16 ENCOUNTER — Ambulatory Visit (AMBULATORY_SURGERY_CENTER): Payer: BC Managed Care – PPO | Admitting: Gastroenterology

## 2022-06-16 ENCOUNTER — Encounter: Payer: Self-pay | Admitting: Gastroenterology

## 2022-06-16 VITALS — BP 116/74 | HR 61 | Temp 97.5°F | Resp 9 | Ht 70.0 in | Wt 210.0 lb

## 2022-06-16 DIAGNOSIS — Z1211 Encounter for screening for malignant neoplasm of colon: Secondary | ICD-10-CM

## 2022-06-16 DIAGNOSIS — Z86010 Personal history of colonic polyps: Secondary | ICD-10-CM | POA: Diagnosis not present

## 2022-06-16 DIAGNOSIS — Z09 Encounter for follow-up examination after completed treatment for conditions other than malignant neoplasm: Secondary | ICD-10-CM | POA: Diagnosis not present

## 2022-06-16 MED ORDER — SODIUM CHLORIDE 0.9 % IV SOLN: 500.0000 mL | Freq: Once | INTRAVENOUS | Status: DC

## 2022-06-16 NOTE — Progress Notes (Signed)
VS completed by DT.  Pt's states no medical or surgical changes since previsit or office visit.  

## 2022-06-16 NOTE — Patient Instructions (Addendum)
Resume previous diet Continue present medications There were no polyps seen today!  You will need another screening colonoscopy in 10 years, you will receive a letter at that time when you are due for the procedure.   Please call us at [phone] if you have a change in bowel habits, change in family history of colo-rectal cancer, rectal bleeding or other GI concern before that time.  Handouts/information given for high fiber diet, fiber supplement and diverticulosis   YOU HAD AN ENDOSCOPIC PROCEDURE TODAY AT THE [facility]:   Refer to the procedure report that was given to you for any specific questions about what was found during the examination.  If the procedure report does not answer your questions, please call your gastroenterologist to clarify.  If you requested that your care partner not be given the details of your procedure findings, then the procedure report has been included in a sealed envelope for you to review at your convenience later.  YOU SHOULD EXPECT: Some feelings of bloating in the abdomen. Passage of more gas than usual.  Walking can help get rid of the air that was put into your GI tract during the procedure and reduce the bloating. If you had a lower endoscopy (such as a colonoscopy or flexible sigmoidoscopy) you may notice spotting of blood in your stool or on the toilet paper. If you underwent a bowel prep for your procedure, you may not have a normal bowel movement for a few days.  Please Note:  You might notice some irritation and congestion in your nose or some drainage.  This is from the oxygen used during your procedure.  There is no need for concern and it should clear up in a day or so.  SYMPTOMS TO REPORT IMMEDIATELY:  Following lower endoscopy (colonoscopy):  Excessive amounts of blood in the stool  Significant tenderness or worsening of abdominal pains  Swelling of the abdomen that is new, acute  Fever of 100F or higher  For urgent or  emergent issues, a gastroenterologist can be reached at any hour by calling [phone]. Do not use MyChart messaging for urgent concerns.   DIET:  We do recommend a small meal at first, but then you may proceed to your regular diet.  Drink plenty of fluids but you should avoid alcoholic beverages for 24 hours.  ACTIVITY:  You should plan to take it easy for the rest of today and you should NOT DRIVE or use heavy machinery until tomorrow (because of the sedation medicines used during the test).    FOLLOW UP: Our staff will call the number listed on your records the next business day following your procedure.  We will call around 7:15- 8:00 am to check on you and address any questions or concerns that you may have regarding the information given to you following your procedure. If we do not reach you, we will leave a message.      SIGNATURES/CONFIDENTIALITY: You and/or your care partner have signed paperwork which will be entered into your electronic medical record.  These signatures attest to the fact that that the information above on your After Visit Summary has been reviewed and is understood.  Full responsibility of the confidentiality of this discharge information lies with you and/or your care-partner.

## 2022-06-16 NOTE — Progress Notes (Signed)
[facility] History and Physical   Primary Care Physician:  Melida Quitter, MD   Reason for Procedure:   Colon cancer screening/polyp surveillance  Plan:    Colonoscopy     HPI: Kendra [name] is a 52 y.o. female undergoing surveillance colonoscopy.  She underwent a colonoscopy in Nov 2018 to evaluate chronic diarrhea and a 3 mm tubular adenoma was removed. Biopsies negative for microscopic colitis.  She has no family history of colon cancer.  She continues to have issues with chronic diarrhea (partial improvement noted with cholestyramine).    Past Medical History:  Diagnosis Date   Allergy    Chronic headache    Former smoker    quit in 46   GERD (gastroesophageal reflux disease)    History of kidney stones    Hyperlipidemia    Left bundle branch block    Obesity    Systolic dysfunction, left ventricle    EF is 43% per Myoview; dyssynchrony of the septum noted on echo; has LBBB    Past Surgical History:  Procedure Laterality Date   CHOLECYSTECTOMY  01/31/2007   CYSTOSCOPY WITH RETROGRADE PYELOGRAM, URETEROSCOPY AND STENT PLACEMENT  03/06/2012   Procedure: CYSTOSCOPY WITH RETROGRADE PYELOGRAM, URETEROSCOPY AND STENT PLACEMENT;  Surgeon: Milford Cage, MD;  Location: [facility];  Service: Urology;  Laterality: Left;   CYSTOSCOPY WITH RETROGRADE PYELOGRAM, URETEROSCOPY AND STENT PLACEMENT Left 03/20/2012   Procedure: CYSTOSCOPY WITH RETROGRADE PYELOGRAM, URETEROSCOPY AND STENT PLACEMENT;  Surgeon: Milford Cage, MD;  Location: [facility];  Service: Urology;  Laterality: Left;  balloon dilitation   CYSTOSCOPY/URETEROSCOPY/HOLMIUM LASER/STENT PLACEMENT Left 03/08/2018   Procedure: CYSTOSCOPY/RETROGRADE/URETEROSCOPY/STENT PLACEMENT;  Surgeon: Rene Paci, MD;  Location: [facility];  Service: Urology;  Laterality: Left;   EXTRACORPOREAL SHOCK WAVE LITHOTRIPSY Left 01/24/2018   Procedure: EXTRACORPOREAL SHOCK  WAVE LITHOTRIPSY (ESWL);  Surgeon: Sebastian Ache, MD;  Location: [facility];  Service: Urology;  Laterality: Left;   EXTRACORPOREAL SHOCK WAVE LITHOTRIPSY Right 02/07/2021   Procedure: EXTRACORPOREAL SHOCK WAVE LITHOTRIPSY (ESWL);  Surgeon: Crista Elliot, MD;  Location: [facility];  Service: Urology;  Laterality: Right;   FOOT SURGERY  12/30/2005   and 2022 - plantar fascitits   HOLMIUM LASER APPLICATION Left 03/20/2012   Procedure: HOLMIUM LASER APPLICATION;  Surgeon: Milford Cage, MD;  Location: [facility];  Service: Urology;  Laterality: Left;   TONSILLECTOMY  03/02/2010   WRIST SURGERY  11/30/2001    Prior to Admission medications   Medication Sig Start Date End Date Taking? Authorizing Provider  carvedilol (COREG) 12.5 MG tablet Take 1 tablet (12.5 mg total) by mouth 2 (two) times daily. 04/06/22  Yes Nahser, Deloris Ping, MD  rosuvastatin (CRESTOR) 10 MG tablet Take 1 tablet (10 mg total) by mouth daily. 11/21/21  Yes Nahser, Deloris Ping, MD  sacubitril-valsartan (ENTRESTO) 49-51 MG Take 1 tablet by mouth 2 (two) times daily. 09/27/21  Yes Nahser, Deloris Ping, MD  spironolactone (ALDACTONE) 25 MG tablet Take 1 tablet (25 mg total) by mouth daily. 11/21/21  Yes Nahser, Deloris Ping, MD  albuterol (VENTOLIN HFA) 108 (90 Base) MCG/ACT inhaler Inhale 2 puffs into the lungs 4 (four) times daily. 12/26/21   [provider]  CONTRAVE 8-90 MG TB12 Take by mouth. Patient not taking: Reported on 06/16/2022    [provider]  ibuprofen (ADVIL,MOTRIN) 200 MG tablet Take 800 mg by mouth daily as needed for moderate pain.    [provider]  levonorgestrel (LILETTA) 19.5 MCG/DAY IUD IUD Liletta 19.5 mcg/24 hrs (5 yrs) 52 mg intrauterine device  Take 1 device by intrauterine route.    [provider]  ondansetron (ZOFRAN) 4 MG tablet Take 1 tablet by mouth every 8 (eight) hours as needed. 04/24/22   [provider]  vitamin B-12  (CYANOCOBALAMIN) 500 MCG tablet Take 500 mcg by mouth daily. Patient not taking: Reported on 05/26/2022    [provider]    Current Outpatient Medications  Medication Sig Dispense Refill   carvedilol (COREG) 12.5 MG tablet Take 1 tablet (12.5 mg total) by mouth 2 (two) times daily. 180 tablet 2   rosuvastatin (CRESTOR) 10 MG tablet Take 1 tablet (10 mg total) by mouth daily. 90 tablet 3   sacubitril-valsartan (ENTRESTO) 49-51 MG Take 1 tablet by mouth 2 (two) times daily. 180 tablet 3   spironolactone (ALDACTONE) 25 MG tablet Take 1 tablet (25 mg total) by mouth daily. 90 tablet 3   albuterol (VENTOLIN HFA) 108 (90 Base) MCG/ACT inhaler Inhale 2 puffs into the lungs 4 (four) times daily.     CONTRAVE 8-90 MG TB12 Take by mouth. (Patient not taking: Reported on 06/16/2022)     ibuprofen (ADVIL,MOTRIN) 200 MG tablet Take 800 mg by mouth daily as needed for moderate pain.     levonorgestrel (LILETTA) 19.5 MCG/DAY IUD IUD Liletta 19.5 mcg/24 hrs (5 yrs) 52 mg intrauterine device  Take 1 device by intrauterine route.     ondansetron (ZOFRAN) 4 MG tablet Take 1 tablet by mouth every 8 (eight) hours as needed.     vitamin B-12 (CYANOCOBALAMIN) 500 MCG tablet Take 500 mcg by mouth daily. (Patient not taking: Reported on 05/26/2022)     Current Facility-Administered Medications  Medication Dose Route Frequency Provider Last Rate Last Admin   0.9 %  sodium chloride infusion  500 mL Intravenous Once Jenel Lucks, MD        Allergies as of 06/16/2022 - Review Complete 06/16/2022  Allergen Reaction Noted   Codeine Nausea And Vomiting 03/25/2007   Penicillins Hives 03/25/2007    Family History  Problem Relation Age of Onset   Hyperlipidemia Mother    Hyperlipidemia Brother    Hyperlipidemia Brother    Colon cancer Neg Hx    Colon polyps Neg Hx    Esophageal cancer Neg Hx    Rectal cancer Neg Hx    Stomach cancer Neg Hx     Social History   Socioeconomic History   Marital  status: Divorced    Spouse name: Not on file   Number of children: Not on file   Years of education: Not on file   Highest education level: Not on file  Occupational History   Occupation: [employer]  Tobacco Use   Smoking status: Former    Types: Cigarettes    Quit date: 01/31/1991    Years since quitting: 31.3   Smokeless tobacco: Never  Vaping Use   Vaping Use: Never used  Substance and Sexual Activity   Alcohol use: Yes    Comment: occ   Drug use: No   Sexual activity: Not on file  Other Topics Concern   Not on file  Social History Narrative   Daily caffeine use:  Sweet Tea daily      Patient does not get regular exercise   Social Determinants of Health   Financial Resource Strain: Not on file  Food Insecurity: Not on file  Transportation Needs:  Not on file  Physical Activity: Not on file  Stress: Not on file  Social Connections: Not on file  Intimate Partner Violence: Not on file    Review of Systems:  All other review of systems negative except as mentioned in the HPI.  Physical Exam: Vital signs BP 107/70   Pulse 69   Temp (!) 97.5 F (36.4 C) (Temporal)   Ht 5\' 10"  (1.778 m)   Wt 210 lb (95.3 kg)   SpO2 100%   BMI 30.13 kg/m   General:   Alert,  Well-developed, well-nourished, pleasant and cooperative in NAD Airway:  Mallampati 2 Lungs:  Clear throughout to auscultation.   Heart:  Regular rate and rhythm; no murmurs, clicks, rubs,  or gallops. Abdomen:  Soft, nontender and nondistended. Normal bowel sounds.   Neuro/Psych:  Normal mood and affect. A and O x 3   Zelma Snead E. Tomasa Rand, MD [facility]

## 2022-06-16 NOTE — Op Note (Signed)
[facility] Patient Name: Kendra [name] Procedure Date: 06/16/2022 7:19 AM MRN: [id number] Endoscopist: Lorin Picket E. Tomasa Rand , MD, [id number] Age: 52 Referring MD:  Date of Birth: 12/10/1970 Gender: Female Account #: [id number] Procedure:                Colonoscopy Indications:              Surveillance: Personal history of adenomatous                            polyps on last colonoscopy > 5 years ago, Last                            colonoscopy: November 2018 Medicines:                Monitored Anesthesia Care Procedure:                Pre-Anesthesia Assessment:                           - Prior to the procedure, a History and Physical                            was performed, and patient medications and                            allergies were reviewed. The patient's tolerance of                            previous anesthesia was also reviewed. The risks                            and benefits of the procedure and the sedation                            options and risks were discussed with the patient.                            All questions were answered, and informed consent                            was obtained. Prior Anticoagulants: The patient has                            taken no anticoagulant or antiplatelet agents. ASA                            Grade Assessment: II - A patient with mild systemic                            disease. After reviewing the risks and benefits,                            the patient was deemed in satisfactory condition to  undergo the procedure.                           After obtaining informed consent, the colonoscope                            was passed under direct vision. Throughout the                            procedure, the patient's blood pressure, pulse, and                            oxygen saturations were monitored continuously. The                            CF [id number] #[id number] was  introduced through the anus                            and advanced to the the terminal ileum, with                            identification of the appendiceal orifice and IC                            valve. The colonoscopy was performed without                            difficulty. The patient tolerated the procedure                            well. The quality of the bowel preparation was                            excellent. The terminal ileum, ileocecal valve,                            appendiceal orifice, and rectum were photographed.                            The bowel preparation used was SUPREP via split                            dose instruction. Scope In: 8:04:47 AM Scope Out: 8:15:17 AM Scope Withdrawal Time: 0 hours 7 minutes 41 seconds  Total Procedure Duration: 0 hours 10 minutes 30 seconds  Findings:                 The perianal and digital rectal examinations were                            normal. Pertinent negatives include normal                            sphincter tone and no palpable rectal lesions.  A few small-mouthed diverticula were found in the                            sigmoid colon, descending colon and ascending colon.                           The exam was otherwise normal throughout the                            examined colon.                           The terminal ileum appeared normal.                           The retroflexed view of the distal rectum and anal                            verge was normal and showed no anal or rectal                            abnormalities. Complications:            No immediate complications. Estimated Blood Loss:     Estimated blood loss: none. Impression:               - Mild diverticulosis in the sigmoid colon, in the                            descending colon and in the ascending colon.                           - The examined portion of the ileum was normal.                            - The distal rectum and anal verge are normal on                            retroflexion view.                           - No specimens collected. Recommendation:           - Patient has a contact number available for                            emergencies. The signs and symptoms of potential                            delayed complications were discussed with the                            patient. Return to normal activities tomorrow.                            Written discharge instructions were provided  to the                            patient.                           - Resume previous diet.                           - Continue present medications.                           - Repeat colonoscopy in 10 years for screening                            purposes. Neev Mcmains E. Tomasa Rand, MD 06/16/2022 8:23:22 AM This report has been signed electronically.

## 2022-06-16 NOTE — Progress Notes (Signed)
A and O x3. Report to RN. Tolerated MAC anesthesia well. 

## 2022-06-19 ENCOUNTER — Telehealth: Payer: Self-pay | Admitting: *Deleted

## 2022-06-19 NOTE — Telephone Encounter (Signed)
  Follow up Call-     06/16/2022    7:23 AM  Call back number  Post procedure Call Back phone  # [phone]  Permission to leave phone message Yes     Patient questions:  Do you have a fever, pain , or abdominal swelling? No. Pain Score  0 *  Have you tolerated food without any problems? Yes.    Have you been able to return to your normal activities? Yes.    Do you have any questions about your discharge instructions: Diet   No. Medications  No. Follow up visit  No.  Do you have questions or concerns about your Care? No.  Actions: * If pain score is 4 or above: No action needed, pain <4.

## 2022-07-14 NOTE — Progress Notes (Signed)
Pt cancelled  This encounter was created in error - please disregard. 

## 2022-07-18 ENCOUNTER — Ambulatory Visit: Payer: BC Managed Care – PPO | Admitting: Cardiovascular Disease

## 2022-08-14 ENCOUNTER — Telehealth: Payer: Self-pay | Admitting: Gastroenterology

## 2022-08-14 ENCOUNTER — Telehealth: Payer: Self-pay | Admitting: *Deleted

## 2022-08-14 NOTE — Telephone Encounter (Signed)
Patient called back to discuss rectal pain that lasts for 30 minutes or so. Patient also states the pain has happened over the last couple of years and she recently noticed a pea-sized knot or nodule at her rectum yesterday. Patient states she has just had a colonoscopy and nothing noted at that time and is concerned.

## 2022-08-14 NOTE — Telephone Encounter (Signed)
I need a little more information to make a determination as the best treatment for this patient The recent colonoscopy is reassuring Differential includes inflamed hemorrhoid, anal fissure and/or anorectal spasm If she having pain with passage of stool? Is there any bleeding? Does the "know" come and go? Thanks [employer]

## 2022-08-14 NOTE — Telephone Encounter (Signed)
Called patient and was informed the pain can start anytime typically while sitting for a period of time. Patient does have hemorrhoids, states this feels like a marble and feels differently from other hemorrhoids. Patient denies pain and bleeding with having a stool. Pain comes and goes but is relieved with tylenol and/or a hot bath.

## 2022-08-14 NOTE — Telephone Encounter (Signed)
Called patient to inform of the medication prescribed via Dr. Rhea Belton. Patient informed how to take the Diltiazem 2% gel medication per Dr. Rhea Belton, medication called in to the [facility] at [facility]. Patient also informed to go to [facility] to obtain the medication. Patient understood and agreed.

## 2022-08-14 NOTE — Telephone Encounter (Signed)
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 Tomasa Rand if the above does not improve symptoms or she has additional questions

## 2022-08-14 NOTE — Telephone Encounter (Signed)
Called patient to respond to complaints of pain at rectal area. No answer upon calling, no DPR noted in chart. LMTCB

## 2022-08-14 NOTE — Telephone Encounter (Signed)
Patient called stated she is having a lot of rectal pain and she said yesterday she felt like a small bump like on the outside area of her rectum.

## 2022-08-14 NOTE — Telephone Encounter (Signed)
Patient called in response to a callback from the nurse. Nurse asked the patient if interested in an appt for 08/22/22. Patient was unable to except the appt for that time and another appt was offered for October 10th at 9:30am, patient accepted.

## 2022-08-17 DIAGNOSIS — L564 Polymorphous light eruption: Secondary | ICD-10-CM | POA: Diagnosis not present

## 2022-09-20 DIAGNOSIS — D040 Carcinoma in situ of skin of lip: Secondary | ICD-10-CM | POA: Diagnosis not present

## 2022-09-20 DIAGNOSIS — D2272 Melanocytic nevi of left lower limb, including hip: Secondary | ICD-10-CM | POA: Diagnosis not present

## 2022-09-20 DIAGNOSIS — Z85828 Personal history of other malignant neoplasm of skin: Secondary | ICD-10-CM | POA: Diagnosis not present

## 2022-09-20 DIAGNOSIS — D1801 Hemangioma of skin and subcutaneous tissue: Secondary | ICD-10-CM | POA: Diagnosis not present

## 2022-09-20 DIAGNOSIS — D2262 Melanocytic nevi of left upper limb, including shoulder: Secondary | ICD-10-CM | POA: Diagnosis not present

## 2022-09-20 DIAGNOSIS — D485 Neoplasm of uncertain behavior of skin: Secondary | ICD-10-CM | POA: Diagnosis not present

## 2022-09-20 DIAGNOSIS — D2261 Melanocytic nevi of right upper limb, including shoulder: Secondary | ICD-10-CM | POA: Diagnosis not present

## 2022-09-21 NOTE — Progress Notes (Signed)
Kendra [name] Date of Birth  [birth year] [facility]        [address]    [address]     [place], [place]  [address]      [phone]  Fax  [phone]       Problem List: 1. Left bundle branch block 2. Chest pain 3. Kidney stone 4. Palpitations  5. Chronic  systolic CHF - in the setting of LBBB.  EF 43% by myoview in April  2013.  EF 35% by echo Feb. 2019.      Kendra [name]  is a 52 y.o. female. She presented to her medical Dr. with some upper left-sided abdominal pain/breast pain.  This pain is occasionally pleuritic. She was thought to have a pulmonary illness. A d-dimer was subsequent on and found to be negative the An EKG showed left bundle branch block.   A subsequent echocardiogram revealed dyssynchrony of her septum.  She does not exercise on regular basis. She walks occasionally. She's been in the process of moving. She has noticed that she's a little bit more short of breath moving boxes. She's also noticed some left arm tingling and numbness which radiates down to her fingers.  We performed a Lexiscan Myoview which revealed an EF of 43%.  Overall Impression: Low risk stress nuclear study. No ischemia or infarct. Baseline ECG LBBB with decreased EF  LV Ejection Fraction: 43%. LV Wall Motion: Apical hypokinesis  We initially had her on Toprol-XL but she did not tolerate it. She felt very lethargic. We now have her on carvedilol which he seems to tolerate much better.  She's exercising on occasion.  She has been measuring her BP on occasion.  Her readings have been ok but several have been elevated.  Feb. 27, 2014:  She has had several kidney surgeries since I last saw her.  She had some chest pressure when she woke up for anesthesia. She has had some bad indigestion since then.  Better with antiacids.  She has not been exercising much for the past month.  Her BP has been well controlled.    Nov. 10, 2014:  She is doing well. Having some PVCs on occasion.   She has been walking some.  She has had occaisonal ankle swelling.    No edema today.  She has a new job.   April 22, 2013:    Kendra [name]  presents today for further evaluation of palpitations. She's been under a little bit more stress than usual. She's also been fatigued.   We placed a Holter monitor. She had sinus tachycardia as well as sinus bradycardia. She has an underlying bundle branch block. There were no arrhythmias to suggest SVT   July 07, 2013:  Kendra [name] is doing well.  Able to do all of her normal activities without problems. Trying to exercise.  She has not had many palpitations.  Sleeping better.    Wants to delay getting the sleep study.  July 28, 2014:  Doing great . Not many palpitations  Exercising  - walking several times a week .   Doing great.  Has lost 8 lbs.   Sept. 1, 2017:  Doing well No CP , no dyspnea Walking ,  Has lost 9 lbs since her last visit .   February 06, 2017:  Feeling well. Has gained some weight.    Works in [employer]  ( [employer])   No CP   Not avoiding salt .  Feb. 5, 2019:  Kendra [name] is seen back for her chronic systolic congestive heart failure, left bundle branch block. Echocardiogram from January 21 reveals reduced left ventricular systolic function with an EF of 35%.   We added Entresto 24-26 mg twice a day. Has not been exercising   Feb. 28, 2019 Seen with husband, Moise Boring Has had some low BP readings  Since adding Entresto  Has had 2-3 episodes of lightheadedness -  Starting to exercise but she has lots of DOE .  Has noticed some symptoms with exercise since she is short of breath .  May 08, 2017:  Kendra [name] is seen back for eval of her chf and LBBB  Started on Entresto 49-51  - tolerating it well Had a myoview - which revealed septal defect - thought to be due to LBBB .   Coronary CT angiogram was normal  Still having some DOE  Climbing stairs  No near syncope ,   Tolerating meds well  Jun 15, 2017:   Kendra [name] is seen today for  follow-up of her chronic systolic congestive heart failure. We increased her carvedilol to 6.25 mg twice a day at her last visit. Wt = 227,  Tolerating the coreg well  Still has some shortness of breath .   Sept. 14, 2021  Kendra [name] is seen today for follow up of her chf. Had a vidio visit last year Echo from 2020 shows normal EF of 50-55% Some dyssynchrony from LBBB Feeling well Has some indigestion Has had some feet swelling  Had bronchitis several weeks ago , has been on some medrol dose packs   Has not had the covid vaccine yet. Had covid in Nov. 2020  Sept. 12, 2022: Kendra [name] is seen today for follow up of her CHF Has LBBB with dyssynchrony Wt is 233 lbs.  No CP , no dyspnea  Doing some yoga  Does not like to walk ,  likes to work out in the AM .   Sept. 11, 2023 Seen with husband, Moise Boring,  Kendra [name] is seen for follow up of her LBBB   echo  10/06/21 - shows mildly reduced LVEF  40-45% Grade I DD Trivial MR  Wt is 198 lbs  Is taking Wegovy    Coronary CTA April 2019 -  CAC = 0 Normal coronaries  On Coreg 625 BID Entresto 49-51 BID  Spironolactone 25 QD  start Marcelline Deist    Will get cardiac MRI   Dec. 18, 2023 Kendra [name] is seen for follow up of her CHF, LBBB Wt is 200 lbs   Cardiac MRI reveals EF of 46% There is no late gadolinium enhancement to suggest scarring  There is an increase in signal in the posterior lateral aspect of the left breast  The radiologist recommendation is to follow up with yearly mammograms   She followed up with a mammogram - every thing is clear      Did not tolerate the [place].   Had dysuria , some itching  She stopped it   Is on coreg 6.25 BID ,  will increase to 12.5 BID Entresto 49-51 Spironolactone 25    July 18, 2022 No show, cancelled    Aug. 23, 2024  Kendra [name] is seen for follow up of her CHF , LBBB  Wt is 234 lbs  - stopped her Reginal Lutes  Has been walking , no DOE  Echo from Sept. 2023 showed midly depressed LV  function  Coreg was increased   She has questions about her rosuvastatin.  Labs from her primary medical doctor (Dr. Nadene Rubins  from August, 2023 reveals total cholesterol of 194 HDL is 63 LDL is 109 Triglyceride levels 110  Frustrated that she cannot get Wegovy any longer  Tried Contrave but could not tolerate it   Has occasional palpitations , likley PVCs Will place a zio monitor   Has some leg edema  Will give her lasix 20 mg a day PRN Kdur    Current Outpatient Medications on File Prior to Visit  Medication Sig Dispense Refill   carvedilol (COREG) 12.5 MG tablet Take 1 tablet (12.5 mg total) by mouth 2 (two) times daily. 180 tablet 2   levonorgestrel (LILETTA) 19.5 MCG/DAY IUD IUD Liletta 19.5 mcg/24 hrs (5 yrs) 52 mg intrauterine device  Take 1 device by intrauterine route.     rosuvastatin (CRESTOR) 10 MG tablet Take 1 tablet (10 mg total) by mouth daily. 90 tablet 3   spironolactone (ALDACTONE) 25 MG tablet Take 1 tablet (25 mg total) by mouth daily. 90 tablet 3   albuterol (VENTOLIN HFA) 108 (90 Base) MCG/ACT inhaler Inhale 2 puffs into the lungs 4 (four) times daily.     CONTRAVE 8-90 MG TB12 Take by mouth. (Patient not taking: Reported on 06/16/2022)     ibuprofen (ADVIL,MOTRIN) 200 MG tablet Take 800 mg by mouth daily as needed for moderate pain. (Patient not taking: Reported on 09/22/2022)     ondansetron (ZOFRAN) 4 MG tablet Take 1 tablet by mouth every 8 (eight) hours as needed.     vitamin B-12 (CYANOCOBALAMIN) 500 MCG tablet Take 500 mcg by mouth daily. (Patient not taking: Reported on 09/22/2022)     No current facility-administered medications on file prior to visit.    Allergies  Allergen Reactions   Codeine Nausea And Vomiting   Penicillins Hives    Did it involve swelling of the face/tongue/throat, SOB, or low BP? No Did it involve sudden or severe rash/hives, skin peeling, or any reaction on the inside of your mouth or nose? No Did you need to seek medical  attention at a hospital or doctor's office? No When did it last happen?      Childhood allergy If all above answers are "NO", may proceed with cephalosporin use.     Past Medical History:  Diagnosis Date   Allergy    Chronic headache    Former smoker    quit in 59   GERD (gastroesophageal reflux disease)    History of kidney stones    Hyperlipidemia    Left bundle branch block    Obesity    Systolic dysfunction, left ventricle    EF is 43% per Myoview; dyssynchrony of the septum noted on echo; has LBBB    Past Surgical History:  Procedure Laterality Date   CHOLECYSTECTOMY  01/31/2007   CYSTOSCOPY WITH RETROGRADE PYELOGRAM, URETEROSCOPY AND STENT PLACEMENT  03/06/2012   Procedure: CYSTOSCOPY WITH RETROGRADE PYELOGRAM, URETEROSCOPY AND STENT PLACEMENT;  Surgeon: Milford Cage, MD;  Location[facility];  Service: Urology;  Laterality: Left;   CYSTOSCOPY WITH RETROGRADE PYELOGRAM, URETEROSCOPY AND STENT PLACEMENT Left 03/20/2012   Procedure: CYSTOSCOPY WITH RETROGRADE PYELOGRAM, URETEROSCOPY AND STENT PLACEMENT;  Surgeon: Milford Cage, MD;  Location[facility];  Service: Urology;  Laterality: Left;  balloon dilitation   CYSTOSCOPY/URETEROSCOPY/HOLMIUM LASER/STENT PLACEMENT Left 03/08/2018   Procedure: CYSTOSCOPY/RETROGRADE/URETEROSCOPY/STENT PLACEMENT;  Surgeon: Rene Paci, MD;  Location: [facility];  Service: Urology;  Laterality: Left;   EXTRACORPOREAL SHOCK WAVE LITHOTRIPSY  Left 01/24/2018   Procedure: EXTRACORPOREAL SHOCK WAVE LITHOTRIPSY (ESWL);  Surgeon: Sebastian Ache, MD;  Location: [facility];  Service: Urology;  Laterality: Left;   EXTRACORPOREAL SHOCK WAVE LITHOTRIPSY Right 02/07/2021   Procedure: EXTRACORPOREAL SHOCK WAVE LITHOTRIPSY (ESWL);  Surgeon: Crista Elliot, MD;  Location[facility];  Service: Urology;  Laterality: Right;   FOOT SURGERY  12/30/2005   and 2022 - plantar fascitits    HOLMIUM LASER APPLICATION Left 03/20/2012   Procedure: HOLMIUM LASER APPLICATION;  Surgeon: Milford Cage, MD;  Location: [facility];  Service: Urology;  Laterality: Left;   TONSILLECTOMY  03/02/2010   WRIST SURGERY  11/30/2001    Social History   Tobacco Use  Smoking Status Former   Current packs/day: 0.00   Types: Cigarettes   Quit date: 01/31/1991   Years since quitting: 31.6  Smokeless Tobacco Never    Social History   Substance and Sexual Activity  Alcohol Use Yes   Comment: occ    Family History  Problem Relation Age of Onset   Hyperlipidemia Mother    Hyperlipidemia Brother    Hyperlipidemia Brother    Colon cancer Neg Hx    Colon polyps Neg Hx    Esophageal cancer Neg Hx    Rectal cancer Neg Hx    Stomach cancer Neg Hx     Reviw of Systems: Noted in current history, otherwise review of systems is negative.    Physical Exam: Blood pressure 130/88, pulse 71, height 5\' 10"  (1.778 m), weight 234 lb 3.2 oz (106.2 kg), SpO2 97%.       GEN:  Well nourished, well developed in no acute distress HEENT: Normal NECK: No JVD; No carotid bruits LYMPHATICS: No lymphadenopathy CARDIAC: RRR , no murmurs, rubs, gallops RESPIRATORY:  Clear to auscultation without rales, wheezing or rhonchi  ABDOMEN: Soft, non-tender, non-distended MUSCULOSKELETAL:  No edema; No deformity  SKIN: Warm and dry NEUROLOGIC:  Alert and oriented x 3    ECG:     EKG Interpretation Date/Time:  Friday September 22 2022 15:13:24 EDT Ventricular Rate:  71 PR Interval:  158 QRS Duration:  138 QT Interval:  450 QTC Calculation: 489 R Axis:   118  Text Interpretation: Normal sinus rhythm Right axis deviation Non-specific intra-ventricular conduction block T wave abnormality, consider inferior ischemia When compared with ECG of 07-Mar-2018 10:07, QRS axis Shifted right Inverted T waves have replaced nonspecific T wave abnormality in Inferior leads Confirmed by Kristeen Miss ([id number]) on 09/22/2022 4:16:57 PM      Assessment / Plan:   1. Left bundle branch block -     stable left bundle branch block.  Her LV systolic function is mildly depressed.  Will increase Entresto 97-103 twice a day. We have discussed the possible placement of a biventricular pacer at some point if/when her ejection fraction drops below 35%.     2.   Chronic  Combined systolic  /  diastolic  CHF : Increase Entresto to 97-103 twice a day.  She has intermittent leg edema.  Will add Lasix 20 mg a day as needed along with potassium chloride 10 mill equivalent tablets-2 tablets each time she takes Lasix.    3.   HLD -    continue rosuvastatin.  4. Palpitations -    will place a ZIO 3-day monitor to assess her for premature ventricular contractions.     Kristeen Miss, MD  09/22/2022 4:17 PM    [facility] [address],  [address] [place], [place]  [address] Pager [phone] Phone: [phone]; Fax: [phone]

## 2022-09-22 ENCOUNTER — Encounter: Payer: Self-pay | Admitting: Cardiovascular Disease

## 2022-09-22 ENCOUNTER — Ambulatory Visit: Payer: BC Managed Care – PPO | Attending: Cardiovascular Disease | Admitting: Cardiovascular Disease

## 2022-09-22 ENCOUNTER — Ambulatory Visit: Payer: BC Managed Care – PPO | Attending: Cardiovascular Disease

## 2022-09-22 VITALS — BP 130/88 | HR 71 | Ht 70.0 in | Wt 234.2 lb

## 2022-09-22 DIAGNOSIS — E782 Mixed hyperlipidemia: Secondary | ICD-10-CM

## 2022-09-22 DIAGNOSIS — R002 Palpitations: Secondary | ICD-10-CM

## 2022-09-22 DIAGNOSIS — I447 Left bundle-branch block, unspecified: Secondary | ICD-10-CM

## 2022-09-22 DIAGNOSIS — I5042 Chronic combined systolic (congestive) and diastolic (congestive) heart failure: Secondary | ICD-10-CM

## 2022-09-22 MED ORDER — FUROSEMIDE 20 MG PO TABS: 20.0000 mg | ORAL_TABLET | Freq: Every day | ORAL | 6 refills | Status: AC | PRN

## 2022-09-22 MED ORDER — ENTRESTO 97-103 MG PO TABS: 1.0000 | ORAL_TABLET | Freq: Two times a day (BID) | ORAL | 11 refills | Status: DC

## 2022-09-22 MED ORDER — POTASSIUM CHLORIDE CRYS ER 10 MEQ PO TBCR: 20.0000 meq | EXTENDED_RELEASE_TABLET | Freq: Every day | ORAL | 6 refills | Status: AC | PRN

## 2022-09-22 NOTE — Progress Notes (Unsigned)
Enrolled for [employer] to mail a ZIO XT long term holter monitor to the patients address on file.  

## 2022-09-22 NOTE — Patient Instructions (Addendum)
Medication Instructions:  Your physician has recommended you make the following change in your medication:   1) START furosemide (Lasix) 20mg  daily as needed for swelling or weight gain of 3 lbs overnight or 5 lbs in one week  2) START potassium chloride (K-Dur) daily as needed (take when you take Lasix) 3) INCREASE Entresto to 97-103mg  twice daily  *If you need a refill on your cardiac medications before your next appointment, please call your pharmacy*  Lab Work: TODAY: Lipid panel, ALT At next office visit: BMET If you have labs (blood work) drawn today and your tests are completely normal, you will receive your results only by: MyChart Message (if you have MyChart) OR A paper copy in the mail If you have any lab test that is abnormal or we need to change your treatment, we will call you to review the results.  Testing/Procedures: Your physician has requested that you wear a Zio heart monitor for 3 days. This will be mailed to your home with instructions on how to apply the monitor and how to return it when finished. Please allow 2 weeks after returning the heart monitor before our office calls you with the results.   Follow-Up: At [facility], you and your health needs are our priority.  As part of our continuing mission to provide you with exceptional heart care, we have created designated Provider Care Teams.  These Care Teams include your primary Cardiologist (physician) and Advanced Practice Providers (APPs -  Physician Assistants and Nurse Practitioners) who all work together to provide you with the care you need, when you need it.  Your next appointment:   2-3 month(s)  The format for your next appointment:   In Person  Provider:   Kristeen Miss, MD {  Other Instructions ZIO XT- Long Term Monitor Instructions     Your physician has requested you wear a ZIO patch monitor for 3 days.  This is a single patch monitor. [employer] supplies one patch monitor per  enrollment. Additional  stickers are not available. Please do not apply patch if you will be having a Nuclear Stress Test,  Echocardiogram, Cardiac CT, MRI, or Chest Xray during the period you would be wearing the  monitor. The patch cannot be worn during these tests. You cannot remove and re-apply the  ZIO XT patch monitor.  Your ZIO patch monitor will be mailed 3 day USPS to your address on file. It may take 3-5 days  to receive your monitor after you have been enrolled.  Once you have received your monitor, please review the enclosed instructions. Your monitor  has already been registered assigning a specific monitor serial # to you.     Billing and Patient Assistance Program Information     We have supplied [employer] with any of your insurance information on file for billing purposes.  [employer] offers a sliding scale Patient Assistance Program for patients that do not have  insurance, or whose insurance does not completely cover the cost of the ZIO monitor.  You must apply for the Patient Assistance Program to qualify for this discounted rate.  To apply, please call [employer] at [phone], select option 4, select option 2, ask to apply for  Patient Assistance Program. Meredeth Ide will ask your household income, and how many people  are in your household. They will quote your out-of-pocket cost based on that information.  [employer] will also be able to set up a 71-month, interest-free payment plan if needed.  Applying the monitor     Shave hair from upper left chest.  Hold abrader disc by orange tab. Rub abrader in 40 strokes over the upper left chest as  indicated in your monitor instructions.  Clean area with 4 enclosed alcohol pads. Let dry.  Apply patch as indicated in monitor instructions. Patch will be placed under collarbone on left  side of chest with arrow pointing upward.  Rub patch adhesive wings for 2 minutes. Remove white label marked "1". Remove the white  label marked "2".  Rub patch adhesive wings for 2 additional minutes.  While looking in a mirror, press and release button in center of patch. A small green light will  flash 3-4 times. This will be your only indicator that the monitor has been turned on.  Do not shower for the first 24 hours. You may shower after the first 24 hours.  Press the button if you feel a symptom. You will hear a small click. Record Date, Time and  Symptom in the Patient Logbook.  When you are ready to remove the patch, follow instructions on the last 2 pages of Patient  Logbook. Stick patch monitor onto the last page of Patient Logbook.  Place Patient Logbook in the blue and white box. Use locking tab on box and tape box closed  securely. The blue and white box has prepaid postage on it. Please place it in the mailbox as  soon as possible. Your physician should have your test results approximately 7 days after the  monitor has been mailed back to [facility].  Call [facility] Customer Care at [phone] if you have questions regarding  your ZIO XT patch monitor. Call them immediately if you see an orange light blinking on your  monitor.  If your monitor falls off in less than 4 days, contact our Monitor department at [phone].  If your monitor becomes loose or falls off after 4 days call [employer] at [phone] for  suggestions on securing your monitor.

## 2022-09-23 LAB — LIPID PANEL
Chol/HDL Ratio: 3.2 ratio (ref 0.0–4.4)
Cholesterol, Total: 206 mg/dL — ABNORMAL HIGH (ref 100–199)
HDL: 65 mg/dL (ref >39)
LDL Chol Calc (NIH): 106 mg/dL — ABNORMAL HIGH (ref 0–99)
Triglycerides: 208 mg/dL — ABNORMAL HIGH (ref 0–149)
VLDL Cholesterol Cal: 35 mg/dL (ref 5–40)

## 2022-09-23 LAB — ALT: ALT: 31 IU/L (ref 0–32)

## 2022-09-26 DIAGNOSIS — R002 Palpitations: Secondary | ICD-10-CM | POA: Diagnosis not present

## 2022-10-04 DIAGNOSIS — R002 Palpitations: Secondary | ICD-10-CM | POA: Diagnosis not present

## 2022-10-04 DIAGNOSIS — N951 Menopausal and female climacteric states: Secondary | ICD-10-CM | POA: Diagnosis not present

## 2022-10-10 ENCOUNTER — Encounter: Payer: Self-pay | Admitting: Cardiovascular Disease

## 2022-10-11 NOTE — Telephone Encounter (Signed)
Attempted to contact patient, left message to contact our office back

## 2022-10-13 NOTE — Telephone Encounter (Signed)
Called and spoke with patient. 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 She states that she didn't use the bathroom extra at all, maybe once. The swelling was unchanged as well. Gone the next morning as usual. She just called to let us know because she was expecting to go a lot. She denies any urinary symptoms at all such as burning, lower abdominal pain, or retention. She does not weigh herself daily, but will start. The lasix is as needed only, so she will begin weighing so that she can verify if her weight is actually going down, even though she can't see it visually. Advised she may need a higher dose of 40mg , but don't want to make that change until we have weights to be sure.

## 2022-10-13 NOTE — Addendum Note (Signed)
Addended by: Lars Mage on: 10/13/2022 09:35 AM   Modules accepted: Orders

## 2022-10-25 NOTE — Progress Notes (Unsigned)
10/26/2022 Kendra [name] [id number] 03-02-1970  Referring provider: Melida Quitter, MD Primary GI doctor: Dr. Tomasa Rand  ASSESSMENT AND PLAN:      Bile Acid Diarrhea Chronic diarrhea, especially postprandial, following cholecystectomy. Previously managed with a bile acid sequestrant, but discontinued due to inconvenience. -Resume bile acid sequestrant with Colestipol pills. -Consider trial of Xifaxan if symptoms persist but no gas, no bloating, no pain.  Gastroesophageal Reflux Disease (GERD) with dysphagia and globulus sensation  Reports of heartburn, indigestion, and sensation of a lump in the throat. Symptoms have been increasing over the past couple of years. -Start Pepcid (Famotidine) daily, may need PPI after EGD - lifestyle discussed -Schedule an upper endoscopy to further evaluate symptoms with possible dilation at [facility] with Dr. Tomasa Rand.  - I discussed risks of EGD with patient today, including risk of sedation, bleeding or perforation.  Patient provides understanding and gave verbal consent to proceed.  Rectal Spasm Reports of rectal discomfort and pressure pain, possibly related to pelvic floor dysfunction with infrequent amount and urinary symptoms -Consider referral to pelvic floor physical therapy if symptoms persist after endoscopy. - diltiazem as needed - recent normal colonoscopy  Liver Function Previous elevation of ALT 2019 S/p cholecystectomy  -Order a complete liver function panel. - consider AB [place]    Patient Care Team: Melida Quitter, MD as PCP - General (Internal Medicine) Nahser, Deloris Ping, MD as PCP - Cardiology (Cardiology)  HISTORY OF PRESENT ILLNESS: 52 y.o. female with a past medical history of status post cholecystectomy, internal hemorrhoids, chronic systolic congestive heart failure 12/2021 ejection fraction 46% and others listed below presents for evaluation of Diarrhea/GERD.   11/2016 colonoscopy Dr. Christella Hartigan for chronic diarrhea 3  mm TA removed negative microscopic colitis No family history of colon cancer or GI malignancies. 06/16/2022 colonoscopy Dr. Tomasa Rand excellent prep mild diverticulosis sigmoid descending ascending, normal TI, no specimens collected recall 10 years. 08/14/2022 called with rectal pain and intermittent can be with sitting, can last 30 minutes, given diltiazem   Discussed the use of AI scribe software for clinical note transcription with the patient, who gave verbal consent to proceed.  The patient, with a history of gallbladder removal, presents with chronic diarrhea, which she describes as occurring "forever."  The patient reports going to the bathroom four to five times a day, usually within 20 minutes of eating. No melena, no hematochezia. Denies weight loss, AB pain, bloating.  The patient also mentions a possible external hemorrhoid, which she describes as a round little thing that was present after her colonoscopy but has since disappeared. Declines rectal exam.  She has had rectal spams, can be sporadic, while sitting, not associated with BM, happens 4-5 x a year.  The patient also reports increased frequency of urination, particularly after laughing.    The patient also mentions a recent increase in heartburn and indigestion over the past two years. Se describes an episode in July where she experienced sharp abdominal pain and nausea that lasted for about three hours.Patient is s/p cholecystectomy   Recently, the patient has been feeling a lump in her throat, which has been present for a couple of months. She has had dysphagia last 2-3 months having to drink water to get food down.  Denies NSAIDS, ETOH, smoking.      She  reports that she quit smoking about 31 years ago. Her smoking use included cigarettes. She has never used smokeless tobacco. She reports current alcohol use. She reports that she  does not use drugs.  RELEVANT LABS AND IMAGING: CBC    Component Value Date/Time   WBC  10.3 12/28/2021 0719   WBC 6.9 03/07/2018 0911   RBC 4.66 12/28/2021 0719   RBC 4.52 03/07/2018 0911   HGB 14.1 12/28/2021 0719   HCT 41.7 12/28/2021 0719   PLT 308 12/28/2021 0719   MCV 90 12/28/2021 0719   MCH 30.3 12/28/2021 0719   MCH 29.6 03/07/2018 0911   MCHC 33.8 12/28/2021 0719   MCHC 31.6 03/07/2018 0911   RDW 12.2 12/28/2021 0719   LYMPHSABS 1.7 12/07/2017 1300   MONOABS 0.6 12/07/2017 1300   EOSABS 0.1 12/07/2017 1300   BASOSABS 0.1 12/07/2017 1300   Recent Labs    12/28/21 0719  HGB 14.1    CMP     Component Value Date/Time   NA 139 12/28/2021 0719   K 5.0 12/28/2021 0719   CL 102 12/28/2021 0719   CO2 24 12/28/2021 0719   GLUCOSE 103 (H) 12/28/2021 0719   GLUCOSE 104 (H) 03/07/2018 0911   BUN 11 12/28/2021 0719   CREATININE 0.80 12/28/2021 0719   CALCIUM 9.7 12/28/2021 0719   PROT 6.2 03/22/2020 1103   ALBUMIN 4.4 03/22/2020 1103   AST 19 03/22/2020 1103   ALT 31 09/22/2022 1613   ALKPHOS 84 03/22/2020 1103   BILITOT 0.6 03/22/2020 1103   GFRNONAA 82 03/22/2020 1103   GFRAA 95 03/22/2020 1103      Latest Ref Rng & Units 09/22/2022    4:13 PM 03/22/2020   11:03 AM 10/14/2019    9:09 AM  Hepatic Function  Total Protein 6.0 - 8.5 g/dL  6.2  6.6   Albumin 3.8 - 4.8 g/dL  4.4  4.2   AST 0 - 40 IU/L  19  24   ALT 0 - 32 IU/L 31  19  40   Alk Phosphatase 44 - 121 IU/L  84  69   Total Bilirubin 0.0 - 1.2 mg/dL  0.6  0.5   Bilirubin, Direct 0.00 - 0.40 mg/dL  8.11  9.14       Current Medications:   Current Outpatient Medications (Endocrine & Metabolic):    levonorgestrel (LILETTA) 19.5 MCG/DAY IUD IUD, Liletta 19.5 mcg/24 hrs (5 yrs) 52 mg intrauterine device  Take 1 device by intrauterine route.  Current Outpatient Medications (Cardiovascular):    carvedilol (COREG) 12.5 MG tablet, Take 1 tablet (12.5 mg total) by mouth 2 (two) times daily.   colestipol (COLESTID) 1 g tablet, Take 2 tablets (2 g total) by mouth 2 (two) times daily.   furosemide  (LASIX) 20 MG tablet, Take 1 tablet (20 mg total) by mouth daily as needed (for swelling or weight gain of 3 lbs overnight or 5 lbs in one week).   rosuvastatin (CRESTOR) 10 MG tablet, Take 1 tablet (10 mg total) by mouth daily.   sacubitril-valsartan (ENTRESTO) 97-103 MG, Take 1 tablet by mouth 2 (two) times daily.   spironolactone (ALDACTONE) 25 MG tablet, Take 1 tablet (25 mg total) by mouth daily.     Current Outpatient Medications (Other):    famotidine (PEPCID) 40 MG tablet, Take 1 tablet (40 mg total) by mouth at bedtime.   potassium chloride (KLOR-CON M) 10 MEQ tablet, Take 2 tablets (20 mEq total) by mouth daily as needed (take on days you take Lasix).  Medical History:  Past Medical History:  Diagnosis Date   Allergy    Chronic headache    Former smoker  quit in 89   GERD (gastroesophageal reflux disease)    History of kidney stones    Hyperlipidemia    Left bundle branch block    Obesity    Systolic dysfunction, left ventricle    EF is 43% per Myoview; dyssynchrony of the septum noted on echo; has LBBB   Allergies:  Allergies  Allergen Reactions   Codeine Nausea And Vomiting   Penicillins Hives    Did it involve swelling of the face/tongue/throat, SOB, or low BP? No Did it involve sudden or severe rash/hives, skin peeling, or any reaction on the inside of your mouth or nose? No Did you need to seek medical attention at a hospital or doctor's office? No When did it last happen?      Childhood allergy If all above answers are "NO", may proceed with cephalosporin use.      Surgical History:  She  has a past surgical history that includes Wrist surgery (11/30/2001); Foot surgery (12/30/2005); Cholecystectomy (01/31/2007); Tonsillectomy (03/02/2010); Cystoscopy with retrograde pyelogram, ureteroscopy and stent placement (03/06/2012); Cystoscopy with retrograde pyelogram, ureteroscopy and stent placement (Left, 03/20/2012); Holmium laser application (Left, 03/20/2012);  Extracorporeal shock wave lithotripsy (Left, 01/24/2018); Cystoscopy/ureteroscopy/holmium laser/stent placement (Left, 03/08/2018); and Extracorporeal shock wave lithotripsy (Right, 02/07/2021). Family History:  Her family history includes Hyperlipidemia in her brother, brother, and mother.  REVIEW OF SYSTEMS  : All other systems reviewed and negative except where noted in the History of Present Illness.  PHYSICAL EXAM: BP 122/78   Pulse 86   Ht 5\' 10"  (1.778 m)   Wt 236 lb (107 kg)   BMI 33.86 kg/m  General Appearance: Well nourished, in no apparent distress. Head:   Normocephalic and atraumatic. Eyes:  sclerae anicteric,conjunctive pink  Respiratory: Respiratory effort normal, BS equal bilaterally without rales, rhonchi, wheezing. Cardio: RRR with no MRGs. Peripheral pulses intact.  Abdomen: Soft,  Obese ,active bowel sounds. mild tenderness in the epigastrium. Without guarding and Without rebound. No masses. Rectal: declines Musculoskeletal: Full ROM, Normal gait. Without edema. Skin:  Dry and intact without significant lesions or rashes Neuro: Alert and  oriented x4;  No focal deficits. Psych:  Cooperative. Normal mood and affect.    Doree Albee, PA-C 12:01 PM

## 2022-10-26 ENCOUNTER — Other Ambulatory Visit: Payer: BC Managed Care – PPO

## 2022-10-26 ENCOUNTER — Encounter: Payer: Self-pay | Admitting: Physician Assistant

## 2022-10-26 ENCOUNTER — Ambulatory Visit: Payer: BC Managed Care – PPO | Admitting: Physician Assistant

## 2022-10-26 VITALS — BP 122/78 | HR 86 | Ht 70.0 in | Wt 236.0 lb

## 2022-10-26 DIAGNOSIS — N393 Stress incontinence (female) (male): Secondary | ICD-10-CM

## 2022-10-26 DIAGNOSIS — K594 Anal spasm: Secondary | ICD-10-CM | POA: Diagnosis not present

## 2022-10-26 DIAGNOSIS — K219 Gastro-esophageal reflux disease without esophagitis: Secondary | ICD-10-CM

## 2022-10-26 DIAGNOSIS — R1319 Other dysphagia: Secondary | ICD-10-CM

## 2022-10-26 DIAGNOSIS — H35033 Hypertensive retinopathy, bilateral: Secondary | ICD-10-CM | POA: Diagnosis not present

## 2022-10-26 DIAGNOSIS — K529 Noninfective gastroenteritis and colitis, unspecified: Secondary | ICD-10-CM | POA: Diagnosis not present

## 2022-10-26 LAB — COMPREHENSIVE METABOLIC PANEL
ALT: 18 U/L (ref 0–35)
AST: 17 U/L (ref 0–37)
Albumin: 4.5 g/dL (ref 3.5–5.2)
Alkaline Phosphatase: 71 U/L (ref 39–117)
BUN: 14 mg/dL (ref 6–23)
CO2: 28 mEq/L (ref 19–32)
Calcium: 10 mg/dL (ref 8.4–10.5)
Chloride: 105 mEq/L (ref 96–112)
Creatinine, Ser: 0.89 mg/dL (ref 0.40–1.20)
GFR: 75.06 mL/min (ref >60.00)
Glucose, Bld: 89 mg/dL (ref 70–99)
Potassium: 4.2 mEq/L (ref 3.5–5.1)
Sodium: 143 mEq/L (ref 135–145)
Total Bilirubin: 0.6 mg/dL (ref 0.2–1.2)
Total Protein: 7.5 g/dL (ref 6.0–8.3)

## 2022-10-26 LAB — CBC WITH DIFFERENTIAL/PLATELET
Basophils Absolute: 0 10*3/uL (ref 0.0–0.1)
Basophils Relative: 0.9 % (ref 0.0–3.0)
Eosinophils Absolute: 0.1 10*3/uL (ref 0.0–0.7)
Eosinophils Relative: 1.8 % (ref 0.0–5.0)
HCT: 43.9 % (ref 36.0–46.0)
Hemoglobin: 14.2 g/dL (ref 12.0–15.0)
Lymphocytes Relative: 33.4 % (ref 12.0–46.0)
Lymphs Abs: 1.8 10*3/uL (ref 0.7–4.0)
MCHC: 32.4 g/dL (ref 30.0–36.0)
MCV: 91.8 fl (ref 78.0–100.0)
Monocytes Absolute: 0.6 10*3/uL (ref 0.1–1.0)
Monocytes Relative: 10.2 % (ref 3.0–12.0)
Neutro Abs: 3 10*3/uL (ref 1.4–7.7)
Neutrophils Relative %: 53.7 % (ref 43.0–77.0)
Platelets: 276 10*3/uL (ref 150.0–400.0)
RBC: 4.78 Mil/uL (ref 3.87–5.11)
RDW: 13 % (ref 11.5–15.5)
WBC: 5.5 10*3/uL (ref 4.0–10.5)

## 2022-10-26 MED ORDER — FAMOTIDINE 40 MG PO TABS: 40.0000 mg | ORAL_TABLET | Freq: Every day | ORAL | 0 refills | Status: DC

## 2022-10-26 MED ORDER — COLESTIPOL HCL 1 G PO TABS: 2.0000 g | ORAL_TABLET | Freq: Two times a day (BID) | ORAL | 0 refills | Status: DC

## 2022-10-26 NOTE — Addendum Note (Signed)
Addended by: Quentin Mulling on: 10/26/2022 12:35 PM   Modules accepted: Orders

## 2022-10-26 NOTE — Patient Instructions (Addendum)
Your provider has requested that you go to the basement level for lab work before leaving today. Press "B" on the elevator. The lab is located at the first door on the left as you exit the elevator.  Can take pepcid nightly until the EGD Please take this medication 30 minutes to 1 hour before meals- this makes it more effective.  Avoid spicy and acidic foods Avoid fatty foods Limit your intake of coffee, tea, alcohol, and carbonated drinks Work to maintain a healthy weight Keep the head of the bed elevated at least 3 inches with blocks or a wedge pillow if you are having any nighttime symptoms Stay upright for 2 hours after eating Avoid meals and snacks three to four hours before bedtime  Please do sitz baths- these can be found at the pharmacy. It is a Chief Operating Officer that is put in your toliet.  Please increase fiber or add benefiber, increase water and increase acitivity.  Will send in hydrocoritsone suppository, cheapest with GOODRX from [facility], [facility], [employer] or [facility] if your insurance does not pay for it. If the hemorrhoid suppository sent in is too expensive you can do this over the counter trick.  Apply a pea size amount of generic prescription Anusol HC cream that has been sent into your pharmacy to the tip of an over the counter PrepH suppository and insert rectally once every night for at least 7 nights.  If this does not improve there are procedures that can be done.   Take the colestipol, if this does not help let me know.   First do a trial off milk/lactose products if you use them.  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 consider talking with PCP about anti anxiety medication or try head space app for meditation. if any worsening symptoms like blood in stool, weight loss,  please call the office     FODMAP stands for fermentable oligo-, di-, mono-saccharides and polyols (1). These are the scientific terms used to classify groups of carbs that are difficult for our body to digest and that are notorious for triggering digestive symptoms like bloating, gas, loose stools and stomach pain.   You can try low FODMAP diet  - start with eliminating just one column at a time that you feel may be a trigger for you. - the table at the very bottom contains foods that are low in FODMAPs   Sometimes trying to eliminate the FODMAP's from your diet is difficult or tricky, if you are stuggling with trying to do the elimination diet you can try an enzyme.  There is a food enzymes that you sprinkle in or on your food that helps break down the FODMAP. You can read more about the enzyme by going to this site: https://fodzyme.com/  Here some information about pelvic floor dysfunction. This may be contributing to some of your symptoms. We will continue with our evaluation but I do want you to consider adding on fiber supplement with low-dose MiraLAX daily. We could also refer to pelvic floor physical therapy.   Pelvic Floor Dysfunction, Female Pelvic floor dysfunction (PFD) is a condition that results when the group of muscles and connective tissues that support the organs in the pelvis (pelvic floor muscles) do not work well. These muscles and  their connections form a sling that supports the colon and bladder. In women, they also support the uterus. PFD causes pelvic floor muscles to be too weak, too tight, or both. In PFD, muscle movements are not coordinated. This may cause bowel or bladder problems. It may also cause pain. What are the causes? This condition may be caused by an injury to the pelvic area or by a weakening of pelvic muscles. This often results from pregnancy and childbirth or other types of strain. In many cases, the exact cause is not known. What increases the  risk? The following factors may make you more likely to develop this condition: Having chronic bladder tissue inflammation (interstitial cystitis). Being an older person. Being overweight. History of radiation treatment for cancer in the pelvic region. Previous pelvic surgery, such as removal of the uterus (hysterectomy). What are the signs or symptoms? Symptoms of this condition vary and may include: Bladder symptoms, such as: Trouble starting urination and emptying the bladder. Frequent urinary tract infections. Leaking urine when coughing, laughing, or exercising (stress incontinence). Having to pass urine urgently or frequently. Pain when passing urine. Bowel symptoms, such as: Constipation. Urgent or frequent bowel movements. Incomplete bowel movements. Painful bowel movements. Leaking stool or gas. Unexplained genital or rectal pain. Genital or rectal muscle spasms. Low back pain. Other symptoms may include: A heavy, full, or aching feeling in the vagina. A bulge that protrudes into the vagina. Pain during or after sex. How is this diagnosed? This condition may be diagnosed based on: Your symptoms and medical history. A physical exam. During the exam, your health care provider may check your pelvic muscles for tightness, spasm, pain, or weakness. This may include a rectal exam and a pelvic exam. In some cases, you may have diagnostic tests, such as: Electrical muscle function tests. Urine flow testing. X-ray tests of bowel function. Ultrasound of the pelvic organs. How is this treated? Treatment for this condition depends on the symptoms. Treatment options include: Physical therapy. This may include Kegel exercises to help relax or strengthen the pelvic floor muscles. Biofeedback. This type of therapy provides feedback on how tight your pelvic floor muscles are so that you can learn to control them. Internal or external massage therapy. A treatment that involves  electrical stimulation of the pelvic floor muscles to help control pain (transcutaneous electrical nerve stimulation, or TENS). Sound wave therapy (ultrasound) to reduce muscle spasms. Medicines, such as: Muscle relaxants. Bladder control medicines. Surgery to reconstruct or support pelvic floor muscles may be an option if other treatments do not help. Follow these instructions at home: Activity Do your usual activities as told by your health care provider. Ask your health care provider if you should modify any activities. Do pelvic floor strengthening or relaxing exercises at home as told by your physical therapist. Lifestyle Maintain a healthy weight. Eat foods that are high in fiber, such as beans, whole grains, and fresh fruits and vegetables. Limit foods that are high in fat and processed sugars, such as fried or sweet foods. Manage stress with relaxation techniques such as yoga or meditation. General instructions If you have problems with leakage: Use absorbable pads or wear padded underwear. Wash frequently with mild soap. Keep your genital and anal area as clean and dry as possible. Ask your health care provider if you should try a barrier cream to prevent skin irritation. Take warm baths to relieve pelvic muscle tension or spasms. Take over-the-counter and prescription medicines only as told by your health  care provider. Keep all follow-up visits. How is this prevented? The cause of PFD is not always known, but there are a few things you can do to reduce the risk of developing this condition, including: Staying at a healthy weight. Getting regular exercise. Managing stress. Contact a health care provider if: Your symptoms are not improving with home care. You have signs or symptoms of PFD that get worse at home. You develop new signs or symptoms. You have signs of a urinary tract infection, such as: Fever. Chills. Increased urinary frequency. A burning feeling when  urinating. You have not had a bowel movement in 3 days (constipation). Summary Pelvic floor dysfunction results when the muscles and connective tissues in your pelvic floor do not work well. These muscles and their connections form a sling that supports your colon and bladder. In women, they also support the uterus. PFD may be caused by an injury to the pelvic area or by a weakening of pelvic muscles. PFD causes pelvic floor muscles to be too weak, too tight, or a combination of both. Symptoms may vary from person to person. In most cases, PFD can be treated with physical therapies and medicines. Surgery may be an option if other treatments do not help. This information is not intended to replace advice given to you by your health care provider. Make sure you discuss any questions you have with your health care provider. Document Revised: 05/26/2020 Document Reviewed: 05/26/2020 Elsevier Patient Education  2022 Elsevier Inc.   Proctalgia Fugax  Proctalgia fugax is a condition that causes episodes of severe pain in the rectum. The rectum is the last part of the large intestine. The pain can last from seconds to minutes. This condition is not a sign of cancer, but your health care provider may want to rule out other conditions. What are the causes? The exact cause of this condition is not known. It may be caused by sudden tightening (spasms) in the muscles between your hip bones (pelvic muscles) or in your rectum. What are the signs or symptoms? The only symptom of this condition is pain in your rectum. The pain may: Be intense or severe. Last for only a few seconds, or last up to 30 minutes. Occur at night and wake you up from sleep. How is this diagnosed? Proctalgia fugax may be diagnosed based on your medical history and a physical exam. You may also have tests, such as: Anoscopy. In this test, a lighted scope is put into the rectum to look for problems. Barium enema. In this test,  X-rays are taken after a white, chalky substance called barium is put in the colon. The barium shows up on the X-rays and makes it easier to see any problems. Blood tests. These may be done to rule out infections or other problems. How is this treated? There is no cure for this condition. The pain may be helped with: Medicines. Warm baths. Exercises to help you relax. These may include deep breathing exercises or gentle yoga. Gentle massage. Biofeedback. This is a type of therapy that uses sensors to help you learn how to control your body better. Follow these instructions at home:  Take over-the-counter and prescription medicines only as told by your provider. Follow instructions from your provider about what you may eat and drink. Return to your normal activities as told by your provider. Ask your provider what activities are safe for you. Contact a health care provider if: Your pain gets worse. You have new symptoms.  You have a fever and pain in the opening between the buttocks (anus). Get help right away if: There is blood in your stool. There is blood coming from your rectum. This information is not intended to replace advice given to you by your health care provider. Make sure you discuss any questions you have with your health care provider. Document Revised: 08/29/2021 Document Reviewed: 08/29/2021 Elsevier Patient Education  2024 Elsevier Inc.   About Hemorrhoids  Hemorrhoids are swollen veins in the lower rectum and anus.  Also called piles, hemorrhoids are a common problem.  Hemorrhoids may be internal (inside the rectum) or external (around the anus).  Internal Hemorrhoids  Internal hemorrhoids are often painless, but they rarely cause bleeding.  The internal veins may stretch and fall down (prolapse) through the anus to the outside of the body.  The veins may then become irritated and painful.  External Hemorrhoids  External hemorrhoids can be easily seen or felt  around the anal opening.  They are under the skin around the anus.  When the swollen veins are scratched or broken by straining, rubbing or wiping they sometimes bleed.  How Hemorrhoids Occur  Veins in the rectum and around the anus tend to swell under pressure.  Hemorrhoids can result from increased pressure in the veins of your anus or rectum.  Some sources of pressure are:  Straining to have a bowel movement because of constipation Waiting too long to have a bowel movement Coughing and sneezing often Sitting for extended periods of time, including on the toilet Diarrhea Obesity Trauma or injury to the anus Some liver diseases Stress Family history of hemorrhoids Pregnancy  Pregnant women should try to avoid becoming constipated, because they are more likely to have hemorrhoids during pregnancy.  In the last trimester of pregnancy, the enlarged uterus may press on blood vessels and causes hemorrhoids.  In addition, the strain of childbirth sometimes causes hemorrhoids after the birth.  Symptoms of Hemorrhoids  Some symptoms of hemorrhoids include: Swelling and/or a tender lump around the anus Itching, mild burning and bleeding around the anus Painful bowel movements with or without constipation Bright red blood covering the stool, on toilet paper or in the toilet bowel.   Symptoms usually go away within a few days.  Always talk to your doctor about any bleeding to make sure it is not from some other causes.  Diagnosing and Treating Hemorrhoids  Diagnosis is made by an examination by your healthcare provider.  Special test can be performed by your doctor.    Most cases of hemorrhoids can be treated with: High-fiber diet: Eat more high-fiber foods, which help prevent constipation.  Ask for more detailed fiber information on types and sources of fiber from your healthcare provider. Fluids: Drink plenty of water.  This helps soften bowel movements so they are easier to pass. Sitz  baths and cold packs: Sitting in lukewarm water two or three times a day for 15 minutes cleases the anal area and may relieve discomfort.  If the water is too hot, swelling around the anus will get worse.  Placing a cloth-covered ice pack on the anus for ten minutes four times a day can also help reduce selling.  Gently pushing a prolapsed hemorrhoid back inside after the bath or ice pack can be helpful. Medications: For mild discomfort, your healthcare provider may suggest over-the-counter pain medication or prescribe a cream or ointment for topical use.  The cream may contain witch hazel, zinc oxide or petroleum  jelly.  Medicated suppositories are also a treatment option.  Always consult your doctor before applying medications or creams. Procedures and surgeries: There are also a number of procedures and surgeries to shrink or remove hemorrhoids in more serious cases.  Talk to your physician about these options.  You can often prevent hemorrhoids or keep them from becoming worse by maintaining a healthy lifestyle.  Eat a fiber-rich diet of fruits, vegetables and whole grains.  Also, drink plenty of water and exercise regularly.   2007, [employer] Doc.30  _______________________________________________________  If your blood pressure at your visit was 140/90 or greater, please contact your primary care physician to follow up on this.  _______________________________________________________  If you are age 54 or older, your body mass index should be between 23-30. Your Body mass index is 33.86 kg/m. If this is out of the aforementioned range listed, please consider follow up with your Primary Care Provider.  If you are age 80 or younger, your body mass index should be between 19-25. Your Body mass index is 33.86 kg/m. If this is out of the aformentioned range listed, please consider follow up with your Primary Care Provider.    ________________________________________________________  The [facility] providers would like to encourage you to use [facility] to communicate with providers for non-urgent requests or questions.  Due to long hold times on the telephone, sending your provider a message by [facility] may be a faster and more efficient way to get a response.  Please allow 48 business hours for a response.  Please remember that this is for non-urgent requests.  _______________________________________________________ It was a pleasure to see you today!  Thank you for trusting me with your gastrointestinal care!

## 2022-10-27 NOTE — Progress Notes (Signed)
Agree with the assessment and plan as outlined by Quentin Mulling,  PA-C.  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  Would provide reassurance.  If symptoms more frequent/bothersome, can consider other therapies to include botox, but would not recommend any additional treatments beyond prn diltiazem currently  Mailey Landstrom E. Tomasa Rand, MD

## 2022-11-01 ENCOUNTER — Other Ambulatory Visit (HOSPITAL_BASED_OUTPATIENT_CLINIC_OR_DEPARTMENT_OTHER): Payer: Self-pay | Admitting: *Deleted

## 2022-11-01 ENCOUNTER — Other Ambulatory Visit: Payer: Self-pay | Admitting: Cardiovascular Disease

## 2022-11-01 MED ORDER — ROSUVASTATIN CALCIUM 10 MG PO TABS: 10.0000 mg | ORAL_TABLET | Freq: Every day | ORAL | 1 refills | Status: DC

## 2022-11-01 MED ORDER — SPIRONOLACTONE 25 MG PO TABS: 25.0000 mg | ORAL_TABLET | Freq: Every day | ORAL | 1 refills | Status: DC

## 2022-11-09 ENCOUNTER — Ambulatory Visit: Payer: BC Managed Care – PPO | Admitting: Physician Assistant

## 2022-11-13 ENCOUNTER — Ambulatory Visit: Payer: BC Managed Care – PPO | Admitting: Gastroenterology

## 2022-11-13 ENCOUNTER — Encounter: Payer: Self-pay | Admitting: Gastroenterology

## 2022-11-13 VITALS — BP 97/68 | HR 63 | Temp 97.3°F | Resp 18 | Ht 70.0 in | Wt 236.0 lb

## 2022-11-13 DIAGNOSIS — K2970 Gastritis, unspecified, without bleeding: Secondary | ICD-10-CM | POA: Diagnosis not present

## 2022-11-13 DIAGNOSIS — K2100 Gastro-esophageal reflux disease with esophagitis, without bleeding: Secondary | ICD-10-CM

## 2022-11-13 DIAGNOSIS — K319 Disease of stomach and duodenum, unspecified: Secondary | ICD-10-CM | POA: Diagnosis not present

## 2022-11-13 DIAGNOSIS — R1319 Other dysphagia: Secondary | ICD-10-CM

## 2022-11-13 DIAGNOSIS — K219 Gastro-esophageal reflux disease without esophagitis: Secondary | ICD-10-CM

## 2022-11-13 MED ORDER — SODIUM CHLORIDE 0.9 % IV SOLN: 500.0000 mL | INTRAVENOUS | Status: DC

## 2022-11-13 MED ORDER — OMEPRAZOLE 40 MG PO CPDR: 40.0000 mg | DELAYED_RELEASE_CAPSULE | Freq: Two times a day (BID) | ORAL | 3 refills | Status: AC

## 2022-11-13 NOTE — Op Note (Signed)
[facility] Patient Name: Kendra [name] Procedure Date: 11/13/2022 10:21 AM MRN: [id number] Endoscopist: Lorin Picket E. Tomasa Rand , MD, [id number] Age: 52 Referring MD:  Date of Birth: 07-31-1970 Gender: Female Account #: [id number] Procedure:                Upper GI endoscopy Indications:              Dysphagia, Esophageal reflux symptoms that persist                            despite appropriate therapy, Globus sensation Medicines:                Monitored Anesthesia Care Procedure:                Pre-Anesthesia Assessment:                           - Prior to the procedure, a History and Physical                            was performed, and patient medications and                            allergies were reviewed. The patient's tolerance of                            previous anesthesia was also reviewed. The risks                            and benefits of the procedure and the sedation                            options and risks were discussed with the patient.                            All questions were answered, and informed consent                            was obtained. Prior Anticoagulants: The patient has                            taken no anticoagulant or antiplatelet agents. ASA                            Grade Assessment: II - A patient with mild systemic                            disease. After reviewing the risks and benefits,                            the patient was deemed in satisfactory condition to                            undergo the procedure.  After obtaining informed consent, the endoscope was                            passed under direct vision. Throughout the                            procedure, the patient's blood pressure, pulse, and                            oxygen saturations were monitored continuously. The                            Olympus Scope [id number] was introduced through the                             mouth, and advanced to the second part of duodenum.                            The upper GI endoscopy was accomplished without                            difficulty. The patient tolerated the procedure                            well. Scope In: Scope Out: Findings:                 The examined portions of the nasopharynx,                            oropharynx and larynx were normal.                           LA Grade A (one or more mucosal breaks less than 5                            mm, not extending between tops of 2 mucosal folds)                            esophagitis with no bleeding was found.                           Diffuse mild mucosal changes characterized by                            congestion were found in the lower third of the                            esophagus. Biopsies were obtained from the proximal                            and distal esophagus with cold forceps for  histology of suspected eosinophilic esophagitis.                            Estimated blood loss was minimal.                           Scattered moderate inflammation characterized by                            erosions and erythema was found in the gastric body                            and in the gastric antrum. Biopsies were taken with                            a cold forceps for Helicobacter pylori testing.                            Estimated blood loss was minimal.                           The exam of the stomach was otherwise normal.                           The examined duodenum was normal. Complications:            No immediate complications. Estimated Blood Loss:     Estimated blood loss was minimal. Impression:               - The examined portions of the nasopharynx,                            oropharynx and larynx were normal.                           - LA Grade A reflux esophagitis with no bleeding.                           - Congested mucosa in the  esophagus.                           - Gastritis. Biopsied.                           - Normal examined duodenum.                           - Biopsies were taken with a cold forceps for                            evaluation of eosinophilic esophagitis. Recommendation:           - Patient has a contact number available for                            emergencies. The signs and symptoms of potential  delayed complications were discussed with the                            patient. Return to normal activities tomorrow.                            Written discharge instructions were provided to the                            patient.                           - Resume previous diet.                           - Continue present medications.                           - Await pathology results.                           - Use Prilosec (omeprazole) 20 mg PO BID for 8                            weeks. [name] E. Tomasa Rand, MD 11/13/2022 10:47:53 AM This report has been signed electronically.

## 2022-11-13 NOTE — Progress Notes (Signed)
Uneventful anesthetic. Report to pacu rn. Vss. Care resumed by rn.

## 2022-11-13 NOTE — Progress Notes (Signed)
Called to room to assist during endoscopic procedure.  Patient ID and intended procedure confirmed with present staff. Received instructions for my participation in the procedure from the performing physician.  

## 2022-11-13 NOTE — Progress Notes (Signed)
History and Physical Interval Note:  11/13/2022 10:24 AM  Kendra [name]  has presented today for endoscopic procedure(s), with the diagnosis of  Encounter Diagnoses  Name Primary?   Gastroesophageal reflux disease without esophagitis Yes   Esophageal dysphagia   .  The various methods of evaluation and treatment have been discussed with the patient and/or family. After consideration of risks, benefits and other options for treatment, the patient has consented to  the endoscopic procedure(s).   The patient's history has been reviewed, patient examined, no change in status, stable for endoscopic procedure(s).  I have reviewed the patient's chart and labs.  Questions were answered to the patient's satisfaction.     Andreyah Natividad E. Tomasa Rand, MD [facility]

## 2022-11-13 NOTE — Patient Instructions (Addendum)
-   Resume previous diet. - Continue present medications. - Await pathology results. - Use Prilosec (omeprazole) 20 mg PO BID for 8 weeks.  YOU HAD AN ENDOSCOPIC PROCEDURE TODAY AT THE [facility]:   Refer to the procedure report that was given to you for any specific questions about what was found during the examination.  If the procedure report does not answer your questions, please call your gastroenterologist to clarify.  If you requested that your care partner not be given the details of your procedure findings, then the procedure report has been included in a sealed envelope for you to review at your convenience later.  YOU SHOULD EXPECT: Some feelings of bloating in the abdomen. Passage of more gas than usual.  Walking can help get rid of the air that was put into your GI tract during the procedure and reduce the bloating. If you had a lower endoscopy (such as a colonoscopy or flexible sigmoidoscopy) you may notice spotting of blood in your stool or on the toilet paper. If you underwent a bowel prep for your procedure, you may not have a normal bowel movement for a few days.  Please Note:  You might notice some irritation and congestion in your nose or some drainage.  This is from the oxygen used during your procedure.  There is no need for concern and it should clear up in a day or so.  SYMPTOMS TO REPORT IMMEDIATELY:  Following upper endoscopy (EGD)  Vomiting of blood or coffee ground material  New chest pain or pain under the shoulder blades  Painful or persistently difficult swallowing  New shortness of breath  Fever of 100F or higher  Black, tarry-looking stools  For urgent or emergent issues, a gastroenterologist can be reached at any hour by calling [phone]. Do not use MyChart messaging for urgent concerns.    DIET:  We do recommend a small meal at first, but then you may proceed to your regular diet.  Drink plenty of fluids but you should avoid alcoholic  beverages for 24 hours.  ACTIVITY:  You should plan to take it easy for the rest of today and you should NOT DRIVE or use heavy machinery until tomorrow (because of the sedation medicines used during the test).    FOLLOW UP: Our staff will call the number listed on your records the next business day following your procedure.  We will call around 7:15- 8:00 am to check on you and address any questions or concerns that you may have regarding the information given to you following your procedure. If we do not reach you, we will leave a message.     If any biopsies were taken you will be contacted by phone or by letter within the next 1-3 weeks.  Please call us at [phone] if you have not heard about the biopsies in 3 weeks.    SIGNATURES/CONFIDENTIALITY: You and/or your care partner have signed paperwork which will be entered into your electronic medical record.  These signatures attest to the fact that that the information above on your After Visit Summary has been reviewed and is understood.  Full responsibility of the confidentiality of this discharge information lies with you and/or your care-partner.

## 2022-11-14 ENCOUNTER — Telehealth: Payer: Self-pay

## 2022-11-14 NOTE — Telephone Encounter (Signed)
Follow up call to pt, lm for pt to call if having any difficulty with normal activities or eating and drinking.  Also to call if any other questions or concerns.  

## 2022-11-15 LAB — SURGICAL PATHOLOGY

## 2022-11-16 ENCOUNTER — Encounter: Payer: Self-pay | Admitting: Gastroenterology

## 2022-11-16 NOTE — Progress Notes (Signed)
Kendra [name],  The biopsies taken from your stomach were notable for mild reactive gastropathy which is a common finding and often related to use of certain medications (usually NSAIDs), but there was no evidence of Helicobacter pylori infection. This common finding is not felt to necessarily be a cause of any particular symptom and there is no specific treatment or further evaluation recommended.  The biopsies of your esophagus were unremarkable with no evidence of eosinophilic esophagitis to explain your swallowing difficulties.  I suspect your symptoms are related to acid reflux.  Please take the omeprazole as discussed which will help the reflux esophagitis heal and hopefully resolve your symptoms.

## 2022-11-19 ENCOUNTER — Other Ambulatory Visit: Payer: Self-pay | Admitting: Physician Assistant

## 2022-11-26 ENCOUNTER — Encounter: Payer: Self-pay | Admitting: Cardiovascular Disease

## 2022-11-26 NOTE — Progress Notes (Signed)
Appt cancelled This encounter was created in error - please disregard. 

## 2022-11-27 ENCOUNTER — Ambulatory Visit: Payer: BC Managed Care – PPO | Admitting: Cardiovascular Disease

## 2022-12-05 DIAGNOSIS — J9801 Acute bronchospasm: Secondary | ICD-10-CM | POA: Diagnosis not present

## 2022-12-05 DIAGNOSIS — J209 Acute bronchitis, unspecified: Secondary | ICD-10-CM | POA: Diagnosis not present

## 2022-12-19 ENCOUNTER — Other Ambulatory Visit: Payer: Self-pay | Admitting: Cardiovascular Disease

## 2022-12-31 ENCOUNTER — Encounter: Payer: Self-pay | Admitting: Cardiovascular Disease

## 2022-12-31 NOTE — Progress Notes (Unsigned)
Kendra [name] Date of Birth  December 27, 1970 [facility]        [address]    [address]     [place], [place]  [address]      [phone]  Fax  [phone]       Problem List: 1. Left bundle branch block 2. Chest pain 3. Kidney stone 4. Palpitations  5. Chronic  systolic CHF - in the setting of LBBB.  EF 43% by myoview in April  2013.  EF 35% by echo Feb. 2019.      Kendra [name]  is a 52 y.o. female. She presented to her medical Dr. with some upper left-sided abdominal pain/breast pain.  This pain is occasionally pleuritic. She was thought to have a pulmonary illness. A d-dimer was subsequent on and found to be negative the An EKG showed left bundle branch block.   A subsequent echocardiogram revealed dyssynchrony of her septum.  She does not exercise on regular basis. She walks occasionally. She's been in the process of moving. She has noticed that she's a little bit more short of breath moving boxes. She's also noticed some left arm tingling and numbness which radiates down to her fingers.  We performed a Lexiscan Myoview which revealed an EF of 43%.  Overall Impression: Low risk stress nuclear study. No ischemia or infarct. Baseline ECG LBBB with decreased EF  LV Ejection Fraction: 43%. LV Wall Motion: Apical hypokinesis  We initially had her on Toprol-XL but she did not tolerate it. She felt very lethargic. We now have her on carvedilol which he seems to tolerate much better.  She's exercising on occasion.  She has been measuring her BP on occasion.  Her readings have been ok but several have been elevated.  Feb. 27, 2014:  She has had several kidney surgeries since I last saw her.  She had some chest pressure when she woke up for anesthesia. She has had some bad indigestion since then.  Better with antiacids.  She has not been exercising much for the past month.  Her BP has been well controlled.    Nov. 10, 2014:  She is doing well. Having some PVCs on occasion.   She has been walking some.  She has had occaisonal ankle swelling.    No edema today.  She has a new job.   April 22, 2013:    Kendra [name]  presents today for further evaluation of palpitations. She's been under a little bit more stress than usual. She's also been fatigued.   We placed a Holter monitor. She had sinus tachycardia as well as sinus bradycardia. She has an underlying bundle branch block. There were no arrhythmias to suggest SVT   July 07, 2013:  Kendra [name] is doing well.  Able to do all of her normal activities without problems. Trying to exercise.  She has not had many palpitations.  Sleeping better.    Wants to delay getting the sleep study.  July 28, 2014:  Doing great . Not many palpitations  Exercising  - walking several times a week .   Doing great.  Has lost 8 lbs.   Sept. 1, 2017:  Doing well No CP , no dyspnea Walking ,  Has lost 9 lbs since her last visit .   February 06, 2017:  Feeling well. Has gained some weight.    Works in [employer]  ( [employer])   No CP   Not avoiding salt .  Feb. 5, 2019:  Kendra [name] is seen back for her chronic systolic congestive heart failure, left bundle branch block. Echocardiogram from January 21 reveals reduced left ventricular systolic function with an EF of 35%.   We added Entresto 24-26 mg twice a day. Has not been exercising   Feb. 28, 2019 Seen with husband, Kendra [name] Has had some low BP readings  Since adding Entresto  Has had 2-3 episodes of lightheadedness -  Starting to exercise but she has lots of DOE .  Has noticed some symptoms with exercise since she is short of breath .  May 08, 2017:  Kendra [name] is seen back for eval of her chf and LBBB  Started on Entresto 49-51  - tolerating it well Had a myoview - which revealed septal defect - thought to be due to LBBB .   Coronary CT angiogram was normal  Still having some DOE  Climbing stairs  No near syncope ,   Tolerating meds well  Jun 15, 2017:   Kendra [name] is seen today for  follow-up of her chronic systolic congestive heart failure. We increased her carvedilol to 6.25 mg twice a day at her last visit. Wt = 227,  Tolerating the coreg well  Still has some shortness of breath .   Sept. 14, 2021  Kendra [name] is seen today for follow up of her chf. Had a vidio visit last year Echo from 2020 shows normal EF of 50-55% Some dyssynchrony from LBBB Feeling well Has some indigestion Has had some feet swelling  Had bronchitis several weeks ago , has been on some medrol dose packs   Has not had the covid vaccine yet. Had covid in Nov. 2020  Sept. 12, 2022: Kendra [name] is seen today for follow up of her CHF Has LBBB with dyssynchrony Wt is 233 lbs.  No CP , no dyspnea  Doing some yoga  Does not like to walk ,  likes to work out in the AM .   Sept. 11, 2023 Seen with husband, Kendra [name],  Kendra [name] is seen for follow up of her LBBB   echo  10/06/21 - shows mildly reduced LVEF  40-45% Grade I DD Trivial MR  Wt is 198 lbs  Is taking Wegovy    Coronary CTA April 2019 -  CAC = 0 Normal coronaries  On Coreg 625 BID Entresto 49-51 BID  Spironolactone 25 QD  start Marcelline Deist    Will get cardiac MRI   Dec. 18, 2023 Kendra [name] is seen for follow up of her CHF, LBBB Wt is 200 lbs   Cardiac MRI reveals EF of 46% There is no late gadolinium enhancement to suggest scarring  There is an increase in signal in the posterior lateral aspect of the left breast  The radiologist recommendation is to follow up with yearly mammograms   She followed up with a mammogram - every thing is clear      Did not tolerate the [place].   Had dysuria , some itching  She stopped it   Is on coreg 6.25 BID ,  will increase to 12.5 BID Entresto 49-51 Spironolactone 25    July 18, 2022 No show, cancelled    Aug. 23, 2024  Kendra [name] is seen for follow up of her CHF , LBBB  Wt is 234 lbs  - stopped her Reginal Lutes  Has been walking , no DOE  Echo from Sept. 2023 showed midly depressed LV  function  Coreg was increased   She has questions about her rosuvastatin.  Labs from her primary medical doctor (Dr. Nadene Rubins  from August, 2023 reveals total cholesterol of 194 HDL is 63 LDL is 109 Triglyceride levels 110  Frustrated that she cannot get Wegovy any longer  Tried Contrave but could not tolerate it   Has occasional palpitations , likley PVCs Will place a zio monitor   Has some leg edema  Will give her lasix 20 mg a day PRN Kdur    Dec. 2, 2024 Kendra [name] is seen for follow up of her LBBB, CHF, PVCs She is on Entresto 97-103 Spiro 25 Coreg 12.5 BID  We tried [place] - she had UTIs  Event monitor from Sept. 2024 showed rare PACs, rare PVCs No significant arrhythmias  Wt is 230 ( down 4 lbs)   No CP Has some occasional heart flutters   The lasix did not cause any diuresis      Current Outpatient Medications on File Prior to Visit  Medication Sig Dispense Refill   carvedilol (COREG) 12.5 MG tablet Take 1 tablet (12.5 mg total) by mouth 2 (two) times daily. 180 tablet 2   colestipol (COLESTID) 1 g tablet TAKE 2 TABLETS BY MOUTH 2 TIMES DAILY. 360 tablet 1   furosemide (LASIX) 20 MG tablet Take 1 tablet (20 mg total) by mouth daily as needed (for swelling or weight gain of 3 lbs overnight or 5 lbs in one week). 30 tablet 6   levonorgestrel (LILETTA) 19.5 MCG/DAY IUD IUD Liletta 19.5 mcg/24 hrs (5 yrs) 52 mg intrauterine device  Take 1 device by intrauterine route.     omeprazole (PRILOSEC) 40 MG capsule Take 1 capsule (40 mg total) by mouth 2 (two) times daily. (Patient taking differently: Take 40 mg by mouth 2 (two) times daily as needed (indegestion).) 90 capsule 3   potassium chloride (KLOR-CON M) 10 MEQ tablet Take 2 tablets (20 mEq total) by mouth daily as needed (take on days you take Lasix). 30 tablet 6   rosuvastatin (CRESTOR) 10 MG tablet Take 1 tablet (10 mg total) by mouth daily. 90 tablet 1   sacubitril-valsartan (ENTRESTO) 97-103 MG Take 1 tablet by  mouth 2 (two) times daily. 60 tablet 11   spironolactone (ALDACTONE) 25 MG tablet Take 1 tablet (25 mg total) by mouth daily. 90 tablet 1   No current facility-administered medications on file prior to visit.    Allergies  Allergen Reactions   Codeine Nausea And Vomiting   Penicillins Hives    Did it involve swelling of the face/tongue/throat, SOB, or low BP? No Did it involve sudden or severe rash/hives, skin peeling, or any reaction on the inside of your mouth or nose? No Did you need to seek medical attention at a hospital or doctor's office? No When did it last happen?      Childhood allergy If all above answers are "NO", may proceed with cephalosporin use.     Past Medical History:  Diagnosis Date   Allergy    Chronic headache    Former smoker    quit in 59   GERD (gastroesophageal reflux disease)    History of kidney stones    Hyperlipidemia    Left bundle branch block    Obesity    Systolic dysfunction, left ventricle    EF is 43% per Myoview; dyssynchrony of the septum noted on echo; has LBBB    Past Surgical History:  Procedure Laterality Date   CHOLECYSTECTOMY  01/31/2007   CYSTOSCOPY WITH RETROGRADE PYELOGRAM, URETEROSCOPY AND STENT PLACEMENT  03/06/2012   Procedure: CYSTOSCOPY WITH RETROGRADE PYELOGRAM, URETEROSCOPY AND STENT PLACEMENT;  Surgeon: Milford Cage, MD;  Location: [facility];  Service: Urology;  Laterality: Left;   CYSTOSCOPY WITH RETROGRADE PYELOGRAM, URETEROSCOPY AND STENT PLACEMENT Left 03/20/2012   Procedure: CYSTOSCOPY WITH RETROGRADE PYELOGRAM, URETEROSCOPY AND STENT PLACEMENT;  Surgeon: Milford Cage, MD;  Location: [facility];  Service: Urology;  Laterality: Left;  balloon dilitation   CYSTOSCOPY/URETEROSCOPY/HOLMIUM LASER/STENT PLACEMENT Left 03/08/2018   Procedure: CYSTOSCOPY/RETROGRADE/URETEROSCOPY/STENT PLACEMENT;  Surgeon: Rene Paci, MD;  Location: [facility];  Service: Urology;   Laterality: Left;   EXTRACORPOREAL SHOCK WAVE LITHOTRIPSY Left 01/24/2018   Procedure: EXTRACORPOREAL SHOCK WAVE LITHOTRIPSY (ESWL);  Surgeon: Sebastian Ache, MD;  Location: [facility];  Service: Urology;  Laterality: Left;   EXTRACORPOREAL SHOCK WAVE LITHOTRIPSY Right 02/07/2021   Procedure: EXTRACORPOREAL SHOCK WAVE LITHOTRIPSY (ESWL);  Surgeon: Crista Elliot, MD;  Location: [facility];  Service: Urology;  Laterality: Right;   FOOT SURGERY  12/30/2005   and 2022 - plantar fascitits   HOLMIUM LASER APPLICATION Left 03/20/2012   Procedure: HOLMIUM LASER APPLICATION;  Surgeon: Milford Cage, MD;  Location: [facility];  Service: Urology;  Laterality: Left;   TONSILLECTOMY  03/02/2010   WRIST SURGERY  11/30/2001    Social History   Tobacco Use  Smoking Status Former   Current packs/day: 0.00   Types: Cigarettes   Quit date: 01/31/1991   Years since quitting: 31.9  Smokeless Tobacco Never    Social History   Substance and Sexual Activity  Alcohol Use Yes   Comment: occ    Family History  Problem Relation Age of Onset   Hyperlipidemia Mother    Hyperlipidemia Brother    Hyperlipidemia Brother    Colon cancer Neg Hx    Colon polyps Neg Hx    Esophageal cancer Neg Hx    Rectal cancer Neg Hx    Stomach cancer Neg Hx     Reviw of Systems: Noted in current history, otherwise review of systems is negative.     Physical Exam: Blood pressure 102/64, pulse 73, height 5\' 10"  (1.778 m), weight 239 lb (108.4 kg), SpO2 96%.       GEN:  Well nourished, well developed in no acute distress HEENT: Normal NECK: No JVD; No carotid bruits LYMPHATICS: No lymphadenopathy CARDIAC: RRR , no murmurs, rubs, gallops RESPIRATORY:  Clear to auscultation without rales, wheezing or rhonchi  ABDOMEN: Soft, non-tender, non-distended MUSCULOSKELETAL:  No edema; No deformity  SKIN: Warm and dry NEUROLOGIC:  Alert and oriented x 3    ECG:             Assessment / Plan:   1. Left bundle branch block -       stable    2.   Chronic  Combined systolic  /  diastolic  CHF : Increase Entresto to 97-103 twice a day.    Will check a basic metabolic profile today. Will repeat her echocardiogram in April, 2025.  I will plan on seeing her again in May.  Upon my retirement I will have her follow-up with Dr. Timoteo Gaul .     3.   HLD -       4. Palpitations -    has PACs, PVCx      Kristeen Miss, MD  01/01/2023 4:04 PM    [facility] [address] [place],  [address] [place], [place]  [address]  Pager [phone] Phone: [phone]; Fax: [phone]

## 2023-01-01 ENCOUNTER — Encounter: Payer: Self-pay | Admitting: Cardiovascular Disease

## 2023-01-01 ENCOUNTER — Ambulatory Visit: Payer: BC Managed Care – PPO | Attending: Cardiovascular Disease | Admitting: Cardiovascular Disease

## 2023-01-01 VITALS — BP 106/60 | HR 73 | Ht 70.0 in | Wt 239.0 lb

## 2023-01-01 DIAGNOSIS — I5022 Chronic systolic (congestive) heart failure: Secondary | ICD-10-CM | POA: Diagnosis not present

## 2023-01-01 DIAGNOSIS — I447 Left bundle-branch block, unspecified: Secondary | ICD-10-CM

## 2023-01-01 NOTE — Patient Instructions (Signed)
Medication Instructions:  Your physician recommends that you continue on your current medications as directed. Please refer to the Current Medication list given to you today.  *If you need a refill on your cardiac medications before your next appointment, please call your pharmacy*  Lab Work: TODAY: BMET If you have labs (blood work) drawn today and your tests are completely normal, you will receive your results only by: MyChart Message (if you have MyChart) OR A paper copy in the mail If you have any lab test that is abnormal or we need to change your treatment, we will call you to review the results.  Testing/Procedures: Your physician has requested that you have an echocardiogram in April 2025. Echocardiography is a painless test that uses sound waves to create images of your heart. It provides your doctor with information about the size and shape of your heart and how well your heart's chambers and valves are working. This procedure takes approximately one hour. There are no restrictions for this procedure. Please do NOT wear cologne, perfume, aftershave, or lotions (deodorant is allowed). Please arrive 15 minutes prior to your appointment time.  Please note: We ask at that you not bring children with you during ultrasound (echo/ vascular) testing. Due to room size and safety concerns, children are not allowed in the ultrasound rooms during exams. Our front office staff cannot provide observation of children in our lobby area while testing is being conducted. An adult accompanying a patient to their appointment will only be allowed in the ultrasound room at the discretion of the ultrasound technician under special circumstances. We apologize for any inconvenience.  Follow-Up: At [facility], you and your health needs are our priority.  As part of our continuing mission to provide you with exceptional heart care, we have created designated Provider Care Teams.  These Care Teams include your  primary Cardiologist (physician) and Advanced Practice Providers (APPs -  Physician Assistants and Nurse Practitioners) who all work together to provide you with the care you need, when you need it.  Your next appointment:   6 month(s)  The format for your next appointment:   In Person  Provider:   Kristeen Miss, MD {

## 2023-01-02 LAB — BASIC METABOLIC PANEL
BUN/Creatinine Ratio: 12 (ref 9–23)
BUN: 10 mg/dL (ref 6–24)
CO2: 24 mmol/L (ref 20–29)
Calcium: 9.7 mg/dL (ref 8.7–10.2)
Chloride: 106 mmol/L (ref 96–106)
Creatinine, Ser: 0.81 mg/dL (ref 0.57–1.00)
Glucose: 104 mg/dL — ABNORMAL HIGH (ref 70–99)
Potassium: 4.8 mmol/L (ref 3.5–5.2)
Sodium: 144 mmol/L (ref 134–144)
eGFR: 88 mL/min/{1.73_m2} (ref >59)

## 2023-01-12 IMAGING — DX DG ABDOMEN 1V
2 series · 2 of 2 positions shown · non-contrast
Comparison: CT abdomen 12/07/2017, KUB 01/28/2021

CLINICAL DATA: Preop lithotripsy

EXAM:
ABDOMEN - 1 VIEW

[abdomen kub (1 of 2)]
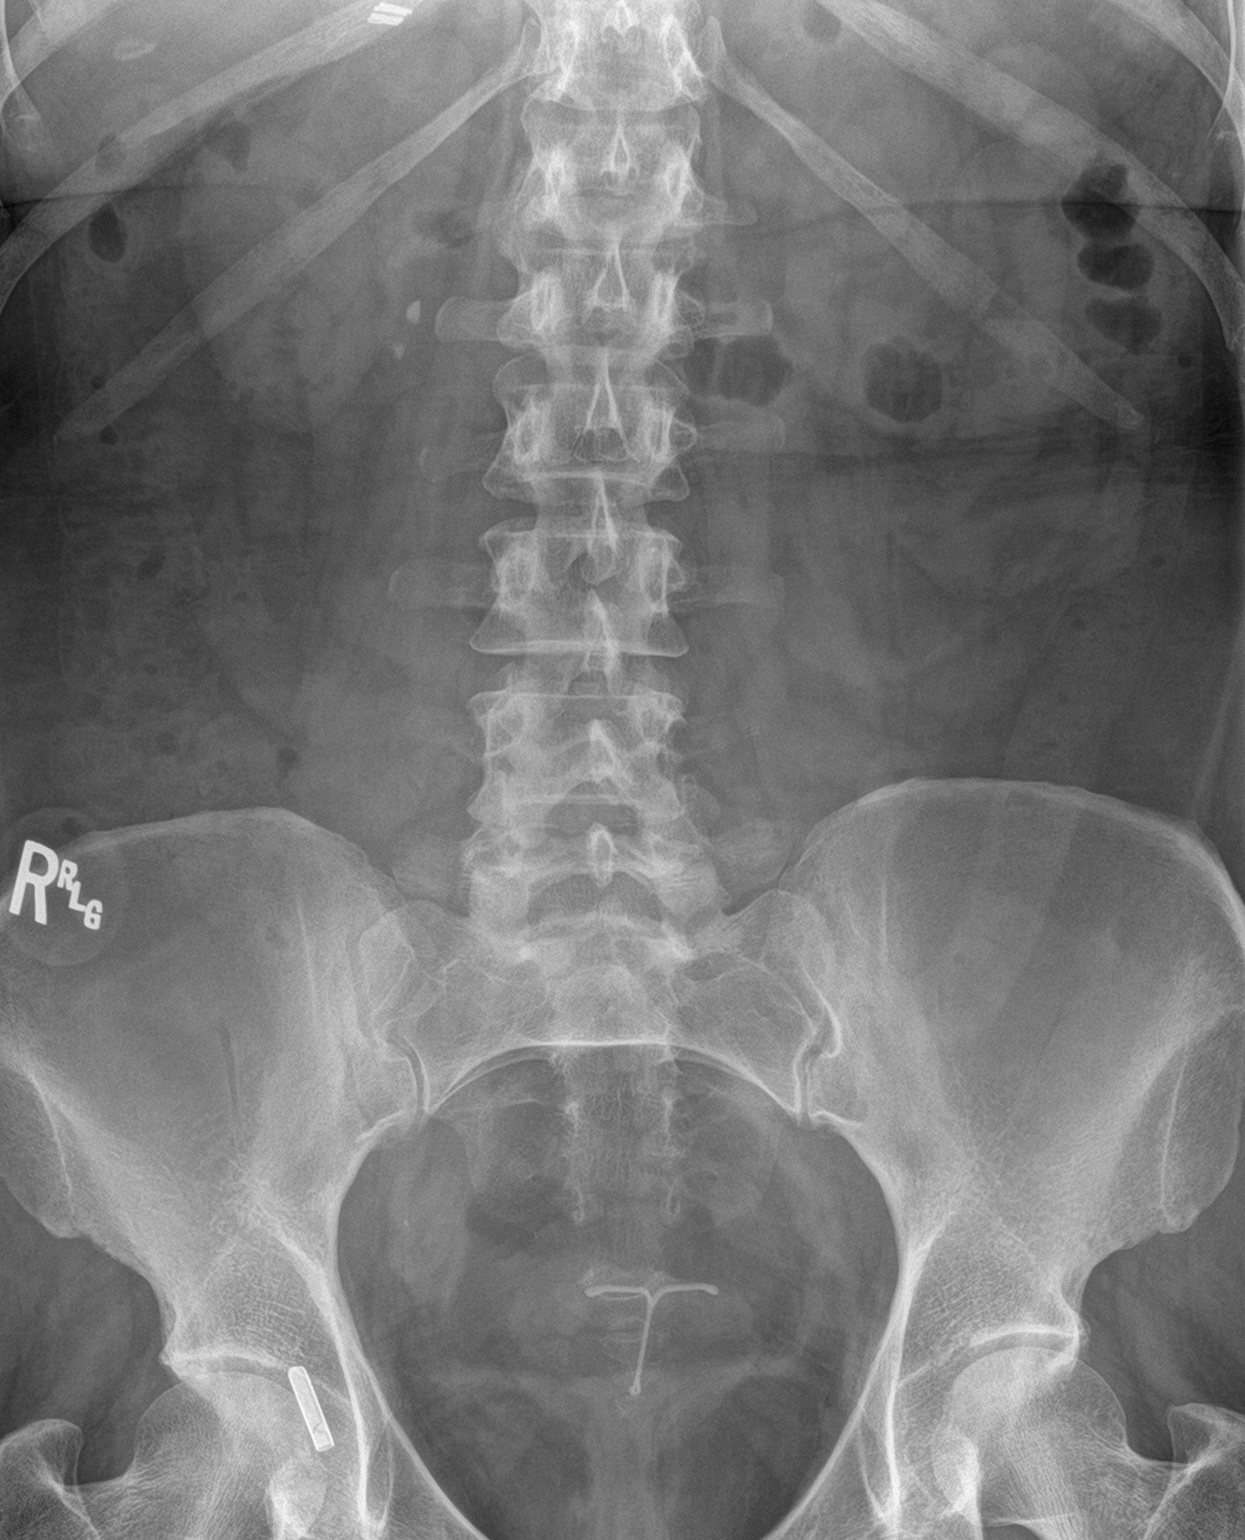

[abdomen kub (2 of 2)]
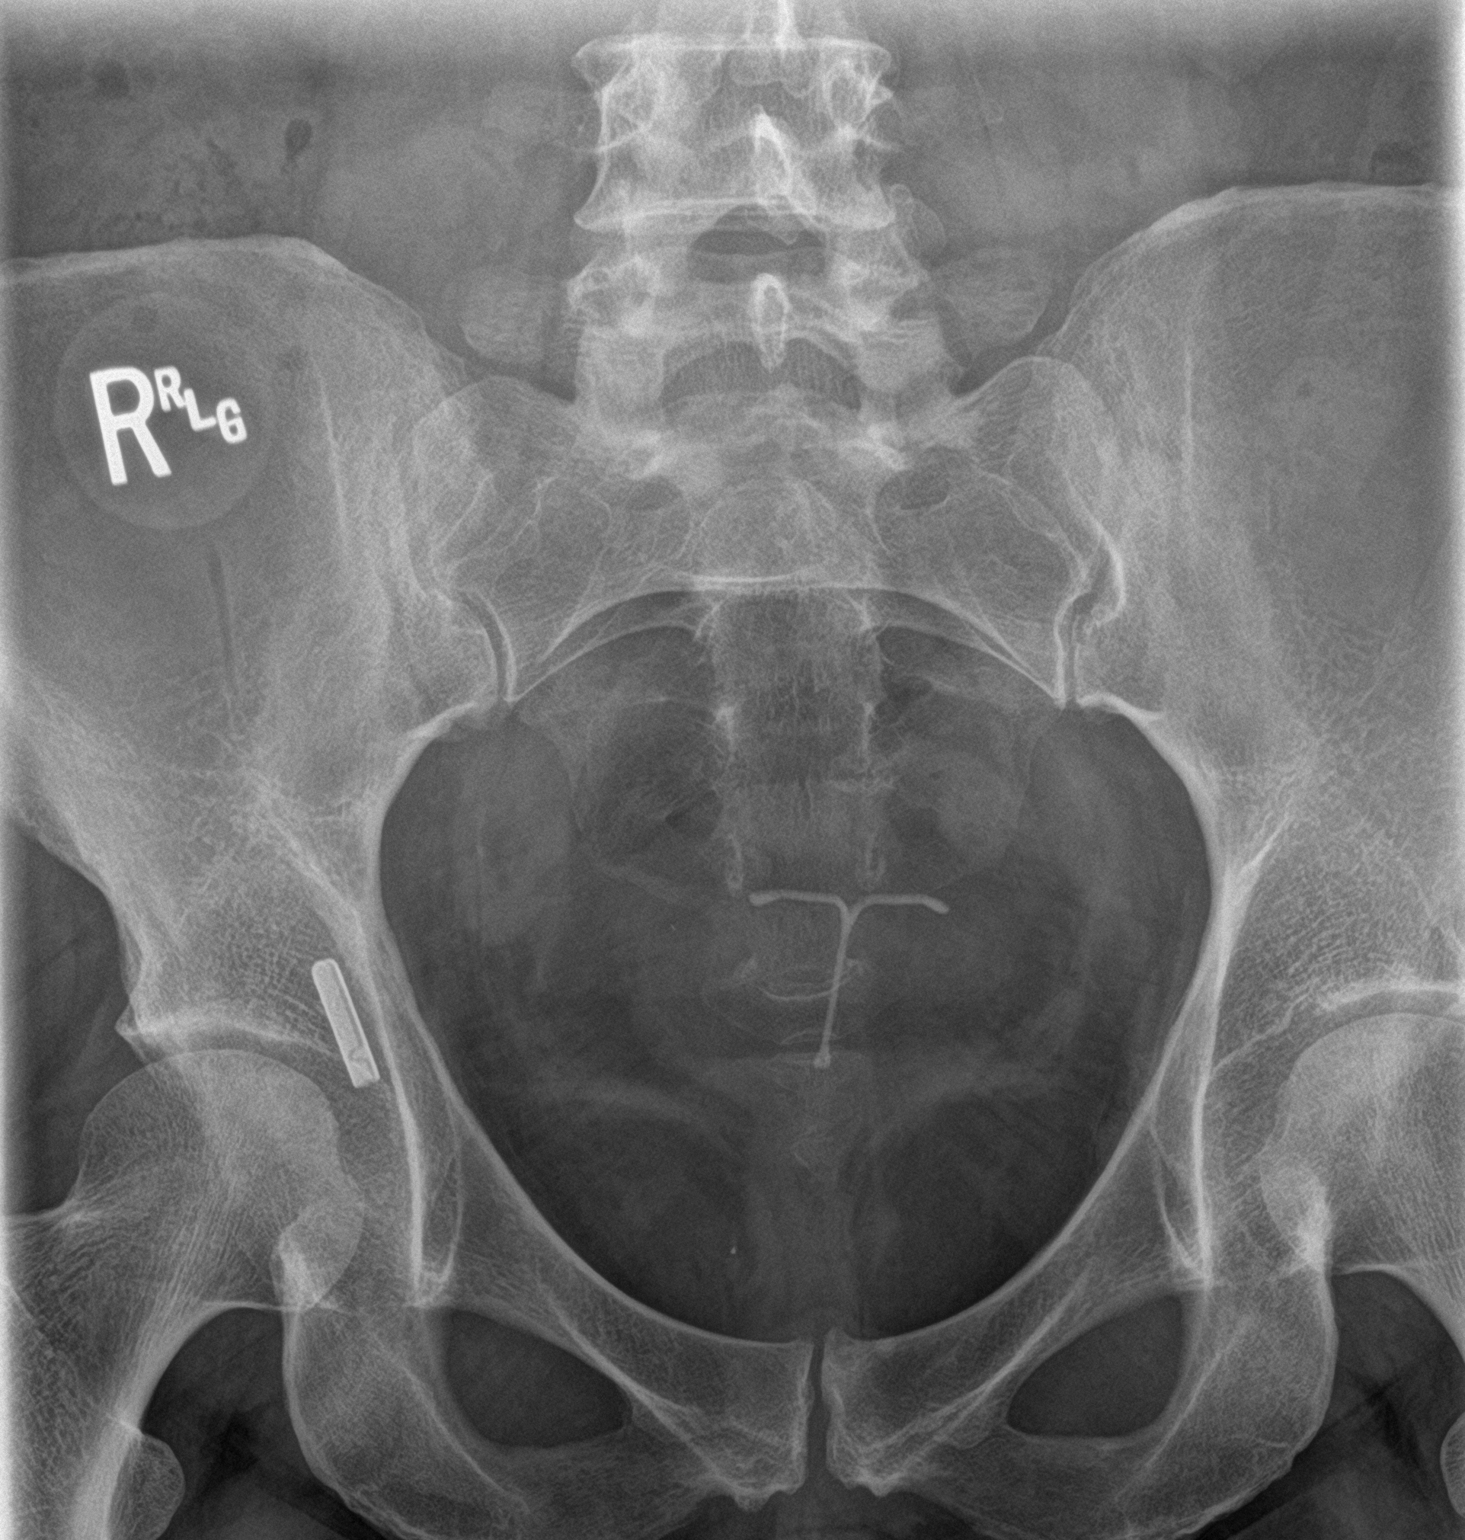

[2 of 2 positions shown; findings below may reference images not displayed]

FINDINGS: No bowel dilatation to suggest obstruction. No evidence of
pneumoperitoneum, portal venous gas or pneumatosis.

5 mm calcification at the level of the right L2 transverse process
concerning for a proximal right ureteral calculus with an adjacent
slightly more distal 3 mm calculus. These are unchanged compared
with 01/28/2021.

No acute osseous abnormality. IUD noted in the pelvis.
IMPRESSION: Two stable proximal right ureteral calculi measuring 5 mm and 3 mm
respectively. These are unchanged compared with 01/28/2021.

## 2023-01-21 ENCOUNTER — Other Ambulatory Visit: Payer: Self-pay

## 2023-01-21 ENCOUNTER — Ambulatory Visit (INDEPENDENT_AMBULATORY_CARE_PROVIDER_SITE_OTHER): Payer: BC Managed Care – PPO

## 2023-01-21 ENCOUNTER — Encounter: Payer: Self-pay | Admitting: Emergency Medicine

## 2023-01-21 ENCOUNTER — Ambulatory Visit
Admission: EM | Admit: 2023-01-21 | Discharge: 2023-01-21 | Disposition: A | Discharge status: Home / Self Care | Payer: BC Managed Care – PPO | Attending: Physician Assistant | Admitting: Physician Assistant

## 2023-01-21 DIAGNOSIS — M545 Low back pain, unspecified: Secondary | ICD-10-CM | POA: Diagnosis not present

## 2023-01-21 DIAGNOSIS — D225 Melanocytic nevi of trunk: Secondary | ICD-10-CM | POA: Insufficient documentation

## 2023-01-21 DIAGNOSIS — M5135 Other intervertebral disc degeneration, thoracolumbar region: Secondary | ICD-10-CM | POA: Diagnosis not present

## 2023-01-21 DIAGNOSIS — D2272 Melanocytic nevi of left lower limb, including hip: Secondary | ICD-10-CM | POA: Insufficient documentation

## 2023-01-21 DIAGNOSIS — L719 Rosacea, unspecified: Secondary | ICD-10-CM | POA: Insufficient documentation

## 2023-01-21 DIAGNOSIS — E669 Obesity, unspecified: Secondary | ICD-10-CM | POA: Insufficient documentation

## 2023-01-21 DIAGNOSIS — L819 Disorder of pigmentation, unspecified: Secondary | ICD-10-CM | POA: Insufficient documentation

## 2023-01-21 MED ORDER — PREDNISONE 20 MG PO TABS: 40.0000 mg | ORAL_TABLET | Freq: Every day | ORAL | 0 refills | Status: AC

## 2023-01-21 MED ORDER — CYCLOBENZAPRINE HCL 10 MG PO TABS: 10.0000 mg | ORAL_TABLET | Freq: Two times a day (BID) | ORAL | 0 refills | Status: AC | PRN

## 2023-01-21 NOTE — ED Triage Notes (Signed)
Pt sts hx of back pain x years but stated that after she lifter her foot yesterday to rinse in sink she felt "shift" and then had intense pain; pt sts still having right lower side back pain since event

## 2023-01-21 NOTE — ED Provider Notes (Signed)
[facility]    CSN: [id number] Arrival date & time: 01/21/23  0809      History   Chief Complaint Chief Complaint  Patient presents with   Back Pain    HPI Kendra [name] is a 52 y.o. female.   Patient here today for evaluation of right lower back pain that started yesterday after she felt a "shift" when she was trying to rinse her foot in the sink.  She reports that initially she thought she just had a stretch in the area however when she put her foot down to try to walk she had difficulty due to pain.  She continues to have some pain with certain movements of her back.  She notes she has had chronic back pain however nothing this severe.  She denies any numbness or tingling.  She states that pain does not radiate down her leg.  She does describe pain as sharp.  She had advanced imaging of her spine earlier this year.  The history is provided by the patient.  Back Pain Associated symptoms: no abdominal pain, no fever and no numbness     Past Medical History:  Diagnosis Date   Allergy    Chronic headache    Former smoker    quit in 89   GERD (gastroesophageal reflux disease)    History of kidney stones    Hyperlipidemia    Left bundle branch block    Obesity    Systolic dysfunction, left ventricle    EF is 43% per Myoview; dyssynchrony of the septum noted on echo; has LBBB    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   Anal skin tag 03/11/2019   COVID-19 12/17/2018   Eustachian tube dysfunction 03/10/2015   Acute bronchitis 04/28/2014   Diarrhea 11/18/2013   Right flank pain 11/18/2013   RLQ abdominal pain 11/18/2013   Routine general  medical examination at a health care facility 08/06/2013   Palpitations 04/22/2013   Chronic systolic congestive heart failure (HCC) 08/16/2011   Left bundle branch block 05/08/2011   Pleuritic chest pain 04/07/2011   Lower urinary tract infectious disease 03/15/2011   Ureteral stone 03/15/2011   Screening for HIV (human immunodeficiency virus) 09/16/2010   WEIGHT LOSS-ABNORMAL 01/11/2009   DEPRESSION 10/20/2008   HYPERLIPIDEMIA 06/22/2007   HEADACHE, CHRONIC 06/22/2007   NEPHROLITHIASIS, HX OF 06/22/2007   Hyperlipidemia 06/22/2007   ECZEMA 03/25/2007    Past Surgical History:  Procedure Laterality Date   CHOLECYSTECTOMY  01/31/2007   CYSTOSCOPY WITH RETROGRADE PYELOGRAM, URETEROSCOPY AND STENT PLACEMENT  03/06/2012   Procedure: CYSTOSCOPY WITH RETROGRADE PYELOGRAM, URETEROSCOPY AND STENT PLACEMENT;  Surgeon: Milford Cage, MD;  Location: [facility];  Service: Urology;  Laterality: Left;   CYSTOSCOPY WITH RETROGRADE PYELOGRAM, URETEROSCOPY AND STENT PLACEMENT Left 03/20/2012   Procedure: CYSTOSCOPY WITH RETROGRADE PYELOGRAM, URETEROSCOPY AND STENT PLACEMENT;  Surgeon: Milford Cage, MD;  Location: [facility];  Service: Urology;  Laterality: Left;  balloon dilitation   CYSTOSCOPY/URETEROSCOPY/HOLMIUM LASER/STENT PLACEMENT Left 03/08/2018   Procedure: CYSTOSCOPY/RETROGRADE/URETEROSCOPY/STENT PLACEMENT;  Surgeon: Rene Paci, MD;  Location: [facility];  Service: Urology;  Laterality: Left;  EXTRACORPOREAL SHOCK WAVE LITHOTRIPSY Left 01/24/2018   Procedure: EXTRACORPOREAL SHOCK WAVE LITHOTRIPSY (ESWL);  Surgeon: Sebastian Ache, MD;  Location: [facility];  Service: Urology;  Laterality: Left;   EXTRACORPOREAL SHOCK WAVE LITHOTRIPSY Right 02/07/2021   Procedure: EXTRACORPOREAL SHOCK WAVE LITHOTRIPSY (ESWL);  Surgeon: Crista Elliot, MD;  Location: [facility];  Service: Urology;  Laterality: Right;   FOOT SURGERY   12/30/2005   and 2022 - plantar fascitits   HOLMIUM LASER APPLICATION Left 03/20/2012   Procedure: HOLMIUM LASER APPLICATION;  Surgeon: Milford Cage, MD;  Location: [facility];  Service: Urology;  Laterality: Left;   TONSILLECTOMY  03/02/2010   WRIST SURGERY  11/30/2001    OB History   No obstetric history on file.      Home Medications    Prior to Admission medications   Medication Sig Start Date End Date Taking? Authorizing Provider  cyclobenzaprine (FLEXERIL) 10 MG tablet Take 1 tablet (10 mg total) by mouth 2 (two) times daily as needed for muscle spasms. 01/21/23  Yes Tomi Bamberger, PA-C  predniSONE (DELTASONE) 20 MG tablet Take 2 tablets (40 mg total) by mouth daily with breakfast for 5 days. 01/21/23 01/26/23 Yes Tomi Bamberger, PA-C  carvedilol (COREG) 12.5 MG tablet Take 1 tablet (12.5 mg total) by mouth 2 (two) times daily. 04/06/22   Nahser, Deloris Ping, MD  colestipol (COLESTID) 1 g tablet TAKE 2 TABLETS BY MOUTH 2 TIMES DAILY. 11/20/22   Doree Albee, PA-C  furosemide (LASIX) 20 MG tablet Take 1 tablet (20 mg total) by mouth daily as needed (for swelling or weight gain of 3 lbs overnight or 5 lbs in one week). 09/22/22   Nahser, Deloris Ping, MD  levonorgestrel (LILETTA) 19.5 MCG/DAY IUD IUD Liletta 19.5 mcg/24 hrs (5 yrs) 52 mg intrauterine device  Take 1 device by intrauterine route.    [provider]  omeprazole (PRILOSEC) 40 MG capsule Take 1 capsule (40 mg total) by mouth 2 (two) times daily. Patient taking differently: Take 40 mg by mouth 2 (two) times daily as needed (indegestion). 11/13/22   Jenel Lucks, MD  potassium chloride (KLOR-CON M) 10 MEQ tablet Take 2 tablets (20 mEq total) by mouth daily as needed (take on days you take Lasix). 09/22/22   Nahser, Deloris Ping, MD  rosuvastatin (CRESTOR) 10 MG tablet Take 1 tablet (10 mg total) by mouth daily. 11/01/22   Nahser, Deloris Ping, MD  sacubitril-valsartan (ENTRESTO) 97-103 MG Take  1 tablet by mouth 2 (two) times daily. 09/22/22   Nahser, Deloris Ping, MD  spironolactone (ALDACTONE) 25 MG tablet Take 1 tablet (25 mg total) by mouth daily. 11/01/22   Nahser, Deloris Ping, MD    Family History Family History  Problem Relation Age of Onset   Hyperlipidemia Mother    Hyperlipidemia Brother    Hyperlipidemia Brother    Colon cancer Neg Hx    Colon polyps Neg Hx    Esophageal cancer Neg Hx    Rectal cancer Neg Hx    Stomach cancer Neg Hx     Social History Social History   Tobacco Use   Smoking status: Former    Current packs/day: 0.00    Types: Cigarettes    Quit date: 01/31/1991    Years since quitting: 31.9   Smokeless tobacco: Never  Vaping Use   Vaping status: Never Used  Substance Use Topics   Alcohol use: Yes    Comment: occ  Drug use: No     Allergies   Codeine, Penicillin g, and Penicillins   Review of Systems Review of Systems  Constitutional:  Negative for chills and fever.  Eyes:  Negative for discharge and redness.  Gastrointestinal:  Negative for abdominal pain, nausea and vomiting.  Musculoskeletal:  Positive for back pain and myalgias.  Neurological:  Negative for numbness.     Physical Exam Triage Vital Signs ED Triage Vitals  Encounter Vitals Group     BP 01/21/23 0828 (!) 140/87     Systolic BP Percentile --      Diastolic BP Percentile --      Pulse Rate 01/21/23 0828 82     Resp 01/21/23 0828 18     Temp 01/21/23 0828 98.4 F (36.9 C)     Temp Source 01/21/23 0828 Oral     SpO2 01/21/23 0828 98 %     Weight --      Height --      Head Circumference --      Peak Flow --      Pain Score 01/21/23 0829 10     Pain Loc --      Pain Education --      Exclude from Growth Chart --    No data found.  Updated Vital Signs BP (!) 140/87 (BP Location: Left Arm)   Pulse 82   Temp 98.4 F (36.9 C) (Oral)   Resp 18   SpO2 98%   Visual Acuity Right Eye Distance:   Left Eye Distance:   Bilateral Distance:    Right Eye  Near:   Left Eye Near:    Bilateral Near:     Physical Exam Vitals and nursing note reviewed.  Constitutional:      General: She is not in acute distress.    Appearance: Normal appearance. She is not ill-appearing.  HENT:     Head: Normocephalic and atraumatic.  Eyes:     Conjunctiva/sclera: Conjunctivae normal.  Cardiovascular:     Rate and Rhythm: Normal rate.  Pulmonary:     Effort: Pulmonary effort is normal. No respiratory distress.  Musculoskeletal:     Comments: No tenderness palpation to midline thoracic or lumbar spine, mild tenderness to palpation to right lower back  Neurological:     Mental Status: She is alert.  Psychiatric:        Mood and Affect: Mood normal.        Behavior: Behavior normal.        Thought Content: Thought content normal.      UC Treatments / Results  Labs (all labs ordered are listed, but only abnormal results are displayed) Labs Reviewed - No data to display  EKG   Radiology DG Lumbar Spine Complete Result Date: 01/21/2023 CLINICAL DATA:  Pain. EXAM: LUMBAR SPINE - COMPLETE 4+ VIEW COMPARISON:  None Available. FINDINGS: No evidence for an acute fracture or subluxation. Loss of disc height with endplate spurring noted T12-L1. Facets are well aligned bilaterally. SI joints unremarkable. IMPRESSION: Degenerative disc disease at T12-L1. No acute bony findings. Electronically Signed   By: Kennith Center M.D.   On: 01/21/2023 08:57    Procedures Procedures (including critical care time)  Medications Ordered in UC Medications - No data to display  Initial Impression / Assessment and Plan / UC Course  I have reviewed the triage vital signs and the nursing notes.  Pertinent labs & imaging results that were available during my care of the patient  were reviewed by me and considered in my medical decision making (see chart for details).    X-ray without acute findings.  Recommended treatment for muscle strain with steroid burst and muscle  relaxer.  Advised muscle laxer may cause drowsiness and use with caution.  Recommended follow-up with PCP should symptoms not improve with treatment or sooner evaluation with any worsening.  Patient expresses understanding.  Final Clinical Impressions(s) / UC Diagnoses   Final diagnoses:  Acute right-sided low back pain without sciatica   Discharge Instructions   None    ED Prescriptions     Medication Sig Dispense Auth. Provider   predniSONE (DELTASONE) 20 MG tablet Take 2 tablets (40 mg total) by mouth daily with breakfast for 5 days. 10 tablet Erma Pinto F, PA-C   cyclobenzaprine (FLEXERIL) 10 MG tablet Take 1 tablet (10 mg total) by mouth 2 (two) times daily as needed for muscle spasms. 20 tablet Tomi Bamberger, PA-C      PDMP not reviewed this encounter.   Tomi Bamberger, PA-C 01/21/23 [phone]

## 2023-01-22 ENCOUNTER — Other Ambulatory Visit: Payer: Self-pay | Admitting: Cardiovascular Disease

## 2023-01-22 ENCOUNTER — Other Ambulatory Visit: Payer: Self-pay | Admitting: Physician Assistant

## 2023-03-01 ENCOUNTER — Other Ambulatory Visit: Payer: Self-pay | Admitting: Cardiovascular Disease

## 2023-03-14 DIAGNOSIS — M25561 Pain in right knee: Secondary | ICD-10-CM | POA: Diagnosis not present

## 2023-04-09 ENCOUNTER — Encounter: Payer: Self-pay | Admitting: Cardiovascular Disease

## 2023-04-30 ENCOUNTER — Other Ambulatory Visit (HOSPITAL_BASED_OUTPATIENT_CLINIC_OR_DEPARTMENT_OTHER): Payer: Self-pay | Admitting: Cardiovascular Disease

## 2023-05-08 ENCOUNTER — Ambulatory Visit (HOSPITAL_COMMUNITY): Payer: BC Managed Care – PPO | Attending: Cardiovascular Disease

## 2023-05-08 DIAGNOSIS — Z1231 Encounter for screening mammogram for malignant neoplasm of breast: Secondary | ICD-10-CM | POA: Diagnosis not present

## 2023-05-08 DIAGNOSIS — I447 Left bundle-branch block, unspecified: Secondary | ICD-10-CM | POA: Diagnosis not present

## 2023-05-08 DIAGNOSIS — I5022 Chronic systolic (congestive) heart failure: Secondary | ICD-10-CM | POA: Diagnosis not present

## 2023-05-08 DIAGNOSIS — Z01419 Encounter for gynecological examination (general) (routine) without abnormal findings: Secondary | ICD-10-CM | POA: Diagnosis not present

## 2023-05-08 LAB — ECHOCARDIOGRAM COMPLETE
Area-P 1/2: 6.27 cm2
S' Lateral: 3.4 cm

## 2023-05-11 ENCOUNTER — Encounter: Payer: Self-pay | Admitting: Cardiovascular Disease

## 2023-06-05 DIAGNOSIS — L814 Other melanin hyperpigmentation: Secondary | ICD-10-CM | POA: Diagnosis not present

## 2023-06-05 DIAGNOSIS — L570 Actinic keratosis: Secondary | ICD-10-CM | POA: Diagnosis not present

## 2023-06-05 DIAGNOSIS — L821 Other seborrheic keratosis: Secondary | ICD-10-CM | POA: Diagnosis not present

## 2023-06-05 DIAGNOSIS — Z129 Encounter for screening for malignant neoplasm, site unspecified: Secondary | ICD-10-CM | POA: Diagnosis not present

## 2023-07-02 DIAGNOSIS — R8279 Other abnormal findings on microbiological examination of urine: Secondary | ICD-10-CM | POA: Diagnosis not present

## 2023-07-02 DIAGNOSIS — N200 Calculus of kidney: Secondary | ICD-10-CM | POA: Diagnosis not present

## 2023-07-03 ENCOUNTER — Encounter (HOSPITAL_BASED_OUTPATIENT_CLINIC_OR_DEPARTMENT_OTHER): Payer: Self-pay | Admitting: Emergency Medicine

## 2023-07-03 ENCOUNTER — Emergency Department (HOSPITAL_BASED_OUTPATIENT_CLINIC_OR_DEPARTMENT_OTHER)

## 2023-07-03 ENCOUNTER — Other Ambulatory Visit: Payer: Self-pay

## 2023-07-03 DIAGNOSIS — M545 Low back pain, unspecified: Secondary | ICD-10-CM | POA: Diagnosis not present

## 2023-07-03 DIAGNOSIS — Z9049 Acquired absence of other specified parts of digestive tract: Secondary | ICD-10-CM | POA: Diagnosis not present

## 2023-07-03 DIAGNOSIS — R109 Unspecified abdominal pain: Secondary | ICD-10-CM | POA: Diagnosis not present

## 2023-07-03 DIAGNOSIS — R1031 Right lower quadrant pain: Secondary | ICD-10-CM | POA: Diagnosis not present

## 2023-07-03 LAB — BASIC METABOLIC PANEL WITH GFR
Anion gap: 13 (ref 5–15)
BUN: 11 mg/dL (ref 6–20)
CO2: 22 mmol/L (ref 22–32)
Calcium: 9.4 mg/dL (ref 8.9–10.3)
Chloride: 107 mmol/L (ref 98–111)
Creatinine, Ser: 0.94 mg/dL (ref 0.44–1.00)
GFR, Estimated: >60 mL/min (ref >60)
Glucose, Bld: 109 mg/dL — ABNORMAL HIGH (ref 70–99)
Potassium: 4.1 mmol/L (ref 3.5–5.1)
Sodium: 142 mmol/L (ref 135–145)

## 2023-07-03 LAB — URINALYSIS, ROUTINE W REFLEX MICROSCOPIC
Bilirubin Urine: NEGATIVE
Glucose, UA: NEGATIVE mg/dL
Ketones, ur: NEGATIVE mg/dL
Leukocytes,Ua: NEGATIVE
Nitrite: NEGATIVE
Protein, ur: NEGATIVE mg/dL
Specific Gravity, Urine: >=1.03 (ref 1.005–1.030)
pH: 5.5 (ref 5.0–8.0)

## 2023-07-03 LAB — HCG, QUANTITATIVE, PREGNANCY: hCG, Beta Chain, Quant, S: 2 m[IU]/mL (ref <5)

## 2023-07-03 LAB — CBC WITH DIFFERENTIAL/PLATELET
Abs Immature Granulocytes: 0.02 10*3/uL (ref 0.00–0.07)
Basophils Absolute: 0 10*3/uL (ref 0.0–0.1)
Basophils Relative: 1 %
Eosinophils Absolute: 0.1 10*3/uL (ref 0.0–0.5)
Eosinophils Relative: 2 %
HCT: 41.1 % (ref 36.0–46.0)
Hemoglobin: 13.5 g/dL (ref 12.0–15.0)
Immature Granulocytes: 0 %
Lymphocytes Relative: 40 %
Lymphs Abs: 2.4 10*3/uL (ref 0.7–4.0)
MCH: 29.5 pg (ref 26.0–34.0)
MCHC: 32.8 g/dL (ref 30.0–36.0)
MCV: 89.7 fL (ref 80.0–100.0)
Monocytes Absolute: 0.5 10*3/uL (ref 0.1–1.0)
Monocytes Relative: 8 %
Neutro Abs: 2.9 10*3/uL (ref 1.7–7.7)
Neutrophils Relative %: 49 %
Platelets: 249 10*3/uL (ref 150–400)
RBC: 4.58 MIL/uL (ref 3.87–5.11)
RDW: 13.2 % (ref 11.5–15.5)
WBC: 6 10*3/uL (ref 4.0–10.5)
nRBC: 0 % (ref 0.0–0.2)

## 2023-07-03 LAB — URINALYSIS, MICROSCOPIC (REFLEX)

## 2023-07-03 NOTE — ED Triage Notes (Addendum)
 Pt presents POV with right sided flank pain x 4 days.  Hx of kidney stones. States this feels the same.  Reports every time she has one it requires surgery or lithotripsy.  No n/v/d.   Pt went to [facility] yesterday and had US  and x-ray which were inconclusive and a CT was ordered but pain has progressed and unable to tolerate.  Did start Bactrim  yesterday for UTI.

## 2023-07-04 ENCOUNTER — Emergency Department (HOSPITAL_BASED_OUTPATIENT_CLINIC_OR_DEPARTMENT_OTHER)
Admission: EM | Admit: 2023-07-04 | Discharge: 2023-07-04 | Disposition: A | Discharge status: Home / Self Care | Attending: Emergency Medicine | Admitting: Emergency Medicine

## 2023-07-04 DIAGNOSIS — R109 Unspecified abdominal pain: Secondary | ICD-10-CM

## 2023-07-04 MED ORDER — METHOCARBAMOL 500 MG PO TABS
500.0000 mg | ORAL_TABLET | Freq: Once | ORAL | Status: AC
  Administered 2023-07-04: 500 mg via ORAL
  Filled 2023-07-04: qty 1

## 2023-07-04 MED ORDER — KETOROLAC TROMETHAMINE 30 MG/ML IJ SOLN
30.0000 mg | Freq: Once | INTRAMUSCULAR | Status: AC
  Administered 2023-07-04: 30 mg via INTRAMUSCULAR
  Filled 2023-07-04: qty 1

## 2023-07-04 MED ORDER — METHOCARBAMOL 500 MG PO TABS: 500.0000 mg | ORAL_TABLET | Freq: Two times a day (BID) | ORAL | 0 refills | Status: AC

## 2023-07-04 MED ORDER — NAPROXEN 500 MG PO TABS: 500.0000 mg | ORAL_TABLET | Freq: Two times a day (BID) | ORAL | 0 refills | Status: DC

## 2023-07-04 NOTE — ED Provider Notes (Signed)
 [facility]  Provider Note  CSN: [id number] Arrival date & time: 07/03/23 2200  History Chief Complaint  Patient presents with   Flank Pain    Kendra [name] is a 53 y.o. female with history of renal stones reports R flank pain onset 4 days ago, worsening in the interim. Saw Urology yesterday where US  and xray were inconclusive, given Rx for Bactrim  for UTI. She had an outpatient CT ordered, but pain became more severe tonight prompting ED visit.    Home Medications Prior to Admission medications   Medication Sig Start Date End Date Taking? Authorizing Provider  methocarbamol (ROBAXIN) 500 MG tablet Take 1 tablet (500 mg total) by mouth 2 (two) times daily. 07/04/23  Yes Charmayne Cooper, MD  naproxen (NAPROSYN) 500 MG tablet Take 1 tablet (500 mg total) by mouth 2 (two) times daily. 07/04/23  Yes Charmayne Cooper, MD  carvedilol  (COREG ) 12.5 MG tablet Take 1 tablet by mouth twice daily. 03/01/23   Nahser, Lela Purple, MD  colestipol  (COLESTID ) 1 g tablet TAKE 2 TABLETS BY MOUTH 2 TIMES DAILY. 11/20/22   Edmonia Gottron, PA-C  cyclobenzaprine  (FLEXERIL ) 10 MG tablet Take 1 tablet (10 mg total) by mouth 2 (two) times daily as needed for muscle spasms. 01/21/23   Vernestine Gondola, PA-C  famotidine  (PEPCID ) 40 MG tablet TAKE 1 TABLET BY MOUTH EVERYDAY AT BEDTIME 01/22/23   Collier, Amanda R, PA-C  furosemide  (LASIX ) 20 MG tablet Take 1 tablet (20 mg total) by mouth daily as needed (for swelling or weight gain of 3 lbs overnight or 5 lbs in one week). 09/22/22   Nahser, Lela Purple, MD  levonorgestrel  (LILETTA ) 19.5 MCG/DAY IUD IUD Liletta  19.5 mcg/24 hrs (5 yrs) 52 mg intrauterine device  Take 1 device by intrauterine route.    [provider]  omeprazole  (PRILOSEC) 40 MG capsule Take 1 capsule (40 mg total) by mouth 2 (two) times daily. Patient taking differently: Take 40 mg by mouth 2 (two) times daily as needed (indegestion). 11/13/22    Elois Hair, MD  potassium chloride  (KLOR-CON  M) 10 MEQ tablet Take 2 tablets (20 mEq total) by mouth daily as needed (take on days you take Lasix ). 09/22/22   Nahser, Lela Purple, MD  rosuvastatin  (CRESTOR ) 10 MG tablet Take 1 tablet by mouth daily. 04/30/23   Nahser, Lela Purple, MD  sacubitril -valsartan  (ENTRESTO ) 97-103 MG Take 1 tablet by mouth 2 (two) times daily. 09/22/22   Nahser, Lela Purple, MD  spironolactone  (ALDACTONE ) 25 MG tablet Take 1 tablet by mouth daily. 04/30/23   Nahser, Lela Purple, MD     Allergies    Codeine, Penicillin g, and Penicillins   Review of Systems   Review of Systems Please see HPI for pertinent positives and negatives  Physical Exam BP (!) 141/90 (BP Location: Right Arm)   Pulse 74   Temp 97.9 F (36.6 C)   Resp 18   SpO2 100%   Physical Exam Vitals and nursing note reviewed.  Constitutional:      Appearance: Normal appearance.  HENT:     Head: Normocephalic and atraumatic.     Nose: Nose normal.     Mouth/Throat:     Mouth: Mucous membranes are moist.  Eyes:     Extraocular Movements: Extraocular movements intact.     Conjunctiva/sclera: Conjunctivae normal.  Cardiovascular:     Rate and Rhythm: Normal rate.  Pulmonary:     Effort:  Pulmonary effort is normal.     Breath sounds: Normal breath sounds.  Abdominal:     General: Abdomen is flat.     Palpations: Abdomen is soft.     Tenderness: There is no abdominal tenderness.  Musculoskeletal:        General: Tenderness (R lumbar paraspinal muscles) present. No swelling. Normal range of motion.     Cervical back: Neck supple.  Skin:    General: Skin is warm and dry.  Neurological:     General: No focal deficit present.     Mental Status: She is alert.  Psychiatric:        Mood and Affect: Mood normal.     ED Results / Procedures / Treatments   EKG None  Procedures Procedures  Medications Ordered in the ED Medications  ketorolac  (TORADOL ) 30 MG/ML injection 30 mg (has no  administration in time range)  methocarbamol (ROBAXIN) tablet 500 mg (has no administration in time range)    Initial Impression and Plan  Patient here with L flank pain, some muscular tenderness, but concerning for renal colic or pyelonephritis. Labs done in triage show unremarkable CBC, BMP. UA without infection. I personally viewed the images from radiology studies and agree with radiologist interpretation: CT is neg for renal stone or other concerning findings. Suspect this is a MSK pain, will give IM toradol , oral robaxin. Rx for same, recommend rest, heat. Continue Abx for reported UTI at Urology office. PCP follow up, RTED for any other concerns.    ED Course       MDM Rules/Calculators/A&P Medical Decision Making Problems Addressed: Right flank pain: acute illness or injury  Amount and/or Complexity of Data Reviewed Labs: ordered. Decision-making details documented in ED Course. Radiology: ordered and independent interpretation performed. Decision-making details documented in ED Course.  Risk Prescription drug management.     Final Clinical Impression(s) / ED Diagnoses Final diagnoses:  Right flank pain    Rx / DC Orders ED Discharge Orders          Ordered    naproxen (NAPROSYN) 500 MG tablet  2 times daily        07/04/23 0100    methocarbamol (ROBAXIN) 500 MG tablet  2 times daily        07/04/23 0100             Charmayne Cooper, MD 07/04/23 0100

## 2023-07-06 DIAGNOSIS — R109 Unspecified abdominal pain: Secondary | ICD-10-CM | POA: Diagnosis not present

## 2023-07-18 ENCOUNTER — Encounter (HOSPITAL_BASED_OUTPATIENT_CLINIC_OR_DEPARTMENT_OTHER): Payer: Self-pay

## 2023-07-19 NOTE — Progress Notes (Unsigned)
 Kendra [name] Date of Birth  [birth year] [facility]        [address]    [address]     [place], [place]  [address]      [phone]  Fax  [phone]       Problem List: 1. Left bundle branch block 2. Chest pain 3. Kidney stone 4. Palpitations  5. Chronic  systolic CHF - in the setting of LBBB.  EF 43% by myoview  in April  2013.  EF 35% by echo Feb. 2019.      Kendra [name]  is a 53 y.o. female. She presented to her medical Dr. with some upper left-sided abdominal pain/breast pain.  This pain is occasionally pleuritic. She was thought to have a pulmonary illness. A d-dimer was subsequent on and found to be negative the An EKG showed left bundle branch block.   A subsequent echocardiogram revealed dyssynchrony of her septum.  She does not exercise on regular basis. She walks occasionally. She's been in the process of moving. She has noticed that she's a little bit more short of breath moving boxes. She's also noticed some left arm tingling and numbness which radiates down to her fingers.  We performed a Lexiscan  Myoview  which revealed an EF of 43%.  Overall Impression: Low risk stress nuclear study. No ischemia or infarct. Baseline ECG LBBB with decreased EF  LV Ejection Fraction: 43%. LV Wall Motion: Apical hypokinesis  We initially had her on Toprol -XL but she did not tolerate it. She felt very lethargic. We now have her on carvedilol  which he seems to tolerate much better.  She's exercising on occasion.  She has been measuring her BP on occasion.  Her readings have been ok but several have been elevated.  Feb. 27, 2014:  She has had several kidney surgeries since I last saw her.  She had some chest pressure when she woke up for anesthesia. She has had some bad indigestion since then.  Better with antiacids.  She has not been exercising much for the past month.  Her BP has been well controlled.    Nov. 10, 2014:  She is doing well. Having some PVCs on occasion.   She has been walking some.  She has had occaisonal ankle swelling.    No edema today.  She has a new job.   April 22, 2013:    Kendra [name]  presents today for further evaluation of palpitations. She's been under a little bit more stress than usual. She's also been fatigued.   We placed a Holter monitor. She had sinus tachycardia as well as sinus bradycardia. She has an underlying bundle branch block. There were no arrhythmias to suggest SVT   July 07, 2013:  Kendra [name] is doing well.  Able to do all of her normal activities without problems. Trying to exercise.  She has not had many palpitations.  Sleeping better.    Wants to delay getting the sleep study.  July 28, 2014:  Doing great . Not many palpitations  Exercising  - walking several times a week .   Doing great.  Has lost 8 lbs.   Sept. 1, 2017:  Doing well No CP , no dyspnea Walking ,  Has lost 9 lbs since her last visit .   February 06, 2017:  Feeling well. Has gained some weight.    Works in [employer]  ( [employer])   No CP   Not avoiding salt .  Feb. 5, 2019:  Kendra [name] is seen back for her chronic systolic congestive heart failure, left bundle branch block. Echocardiogram from January 21 reveals reduced left ventricular systolic function with an EF of 35%.   We added Entresto  24-26 mg twice a day. Has not been exercising   Feb. 28, 2019 Seen with husband, Kendra [name] Has had some low BP readings  Since adding Entresto   Has had 2-3 episodes of lightheadedness -  Starting to exercise but she has lots of DOE .  Has noticed some symptoms with exercise since she is short of breath .  May 08, 2017:  Kendra [name] is seen back for eval of her chf and LBBB  Started on Entresto  49-51  - tolerating it well Had a myoview  - which revealed septal defect - thought to be due to LBBB .   Coronary CT angiogram was normal  Still having some DOE  Climbing stairs  No near syncope ,   Tolerating meds well  Jun 15, 2017:   Kendra [name] is seen today for  follow-up of her chronic systolic congestive heart failure. We increased her carvedilol  to 6.25 mg twice a day at her last visit. Wt = 227,  Tolerating the coreg  well  Still has some shortness of breath .   Sept. 14, 2021  Kendra [name] is seen today for follow up of her chf. Had a vidio visit last year Echo from 2020 shows normal EF of 50-55% Some dyssynchrony from LBBB Feeling well Has some indigestion Has had some feet swelling  Had bronchitis several weeks ago , has been on some medrol  dose packs   Has not had the covid vaccine yet. Had covid in Nov. 2020  Sept. 12, 2022: Kendra [name] is seen today for follow up of her CHF Has LBBB with dyssynchrony Wt is 233 lbs.  No CP , no dyspnea  Doing some yoga  Does not like to walk ,  likes to work out in the AM .   Sept. 11, 2023 Seen with husband, Kendra [name],  Kendra [name] is seen for follow up of her LBBB   echo  10/06/21 - shows mildly reduced LVEF  40-45% Grade I DD Trivial MR  Wt is 198 lbs  Is taking Wegovy    Coronary CTA April 2019 -  CAC = 0 Normal coronaries  On Coreg  625 BID Entresto  49-51 BID  Spironolactone  25 QD  start Farxiga     Will get cardiac MRI   Dec. 18, 2023 Kendra [name] is seen for follow up of her CHF, LBBB Wt is 200 lbs   Cardiac MRI reveals EF of 46% There is no late gadolinium enhancement to suggest scarring  There is an increase in signal in the posterior lateral aspect of the left breast  The radiologist recommendation is to follow up with yearly mammograms   She followed up with a mammogram - every thing is clear      Did not tolerate the Farxiga .   Had dysuria , some itching  She stopped it   Is on coreg  6.25 BID ,  will increase to 12.5 BID Entresto  49-51 Spironolactone  25    July 18, 2022 No show, cancelled    Aug. 23, 2024  Kendra [name] is seen for follow up of her CHF , LBBB  Wt is 234 lbs  - stopped her Kendra [name]  Has been walking , no DOE  Echo from Sept. 2023 showed midly depressed LV  function  Coreg  was increased   She has questions about her rosuvastatin .  Labs from her primary medical doctor (Kendra [name]  from August, 2023 reveals total cholesterol of 194 HDL is 63 LDL is 109 Triglyceride levels 110  Frustrated that she cannot get Wegovy any longer  Tried Contrave but could not tolerate it   Has occasional palpitations , likley PVCs Will place a zio monitor   Has some leg edema  Will give her lasix  20 mg a day PRN Kdur    Dec. 2, 2024 Kendra [name] is seen for follow up of her LBBB, CHF, PVCs She is on Entresto  97-103 Spiro 25 Coreg  12.5 BID  We tried Farxiga  - she had UTIs  Event monitor from Sept. 2024 showed rare PACs, rare PVCs No significant arrhythmias  Wt is 230 ( down 4 lbs)   No CP Has some occasional heart flutters   The lasix  did not cause any diuresis   July 20, 2023 Kendra [name] is seen for follow up of hre LBBB , CHF, rare PVCs  Wt is 225 lbs   Echo from April 2025 shows LVEF of 40-45%.   Normal diastolic functino  Mild MR   We tried Jardiance  in the past but she had significant yeast infections .  She is no longer having any leg edema  Is exercising  Walks several times a week      Current Outpatient Medications on File Prior to Visit  Medication Sig Dispense Refill   carvedilol  (COREG ) 12.5 MG tablet Take 1 tablet by mouth twice daily. 180 tablet 3   cyclobenzaprine  (FLEXERIL ) 10 MG tablet Take 1 tablet (10 mg total) by mouth 2 (two) times daily as needed for muscle spasms. 20 tablet 0   furosemide  (LASIX ) 20 MG tablet Take 1 tablet (20 mg total) by mouth daily as needed (for swelling or weight gain of 3 lbs overnight or 5 lbs in one week). 30 tablet 6   levonorgestrel  (LILETTA ) 19.5 MCG/DAY IUD IUD Liletta  19.5 mcg/24 hrs (5 yrs) 52 mg intrauterine device  Take 1 device by intrauterine route.     methocarbamol  (ROBAXIN ) 500 MG tablet Take 1 tablet (500 mg total) by mouth 2 (two) times daily. 20 tablet 0   potassium chloride   (KLOR-CON  M) 10 MEQ tablet Take 2 tablets (20 mEq total) by mouth daily as needed (take on days you take Lasix ). 30 tablet 6   rosuvastatin  (CRESTOR ) 10 MG tablet Take 1 tablet by mouth daily. 90 tablet 2   sacubitril -valsartan  (ENTRESTO ) 97-103 MG Take 1 tablet by mouth 2 (two) times daily. 60 tablet 11   spironolactone  (ALDACTONE ) 25 MG tablet Take 1 tablet by mouth daily. 90 tablet 2   colestipol  (COLESTID ) 1 g tablet TAKE 2 TABLETS BY MOUTH 2 TIMES DAILY. 360 tablet 1   famotidine  (PEPCID ) 40 MG tablet TAKE 1 TABLET BY MOUTH EVERYDAY AT BEDTIME 90 tablet 0   naproxen  (NAPROSYN ) 500 MG tablet Take 1 tablet (500 mg total) by mouth 2 (two) times daily. 30 tablet 0   omeprazole  (PRILOSEC) 40 MG capsule Take 1 capsule (40 mg total) by mouth 2 (two) times daily. (Patient taking differently: Take 40 mg by mouth 2 (two) times daily as needed (indegestion).) 90 capsule 3   No current facility-administered medications on file prior to visit.    Allergies  Allergen Reactions   Codeine Nausea And Vomiting   Penicillin G     Other Reaction(s): Unknown   Penicillins Hives    Did it involve swelling of the face/tongue/throat, SOB, or low BP? No Did it  involve sudden or severe rash/hives, skin peeling, or any reaction on the inside of your mouth or nose? No Did you need to seek medical attention at a hospital or doctor's office? No When did it last happen?      Childhood allergy If all above answers are "NO", may proceed with cephalosporin use.     Past Medical History:  Diagnosis Date   Allergy    Chronic headache    Former smoker    quit in 70   GERD (gastroesophageal reflux disease)    History of kidney stones    Hyperlipidemia    Left bundle branch block    Obesity    Systolic dysfunction, left ventricle    EF is 43% per Myoview ; dyssynchrony of the septum noted on echo; has LBBB    Past Surgical History:  Procedure Laterality Date   CHOLECYSTECTOMY  01/31/2007   CYSTOSCOPY WITH  RETROGRADE PYELOGRAM, URETEROSCOPY AND STENT PLACEMENT  03/06/2012   Procedure: CYSTOSCOPY WITH RETROGRADE PYELOGRAM, URETEROSCOPY AND STENT PLACEMENT;  Surgeon: Soledad Dupes, MD;  Location: [facility];  Service: Urology;  Laterality: Left;   CYSTOSCOPY WITH RETROGRADE PYELOGRAM, URETEROSCOPY AND STENT PLACEMENT Left 03/20/2012   Procedure: CYSTOSCOPY WITH RETROGRADE PYELOGRAM, URETEROSCOPY AND STENT PLACEMENT;  Surgeon: Soledad Dupes, MD;  Location: [facility];  Service: Urology;  Laterality: Left;  balloon dilitation   CYSTOSCOPY/URETEROSCOPY/HOLMIUM LASER/STENT PLACEMENT Left 03/08/2018   Procedure: CYSTOSCOPY/RETROGRADE/URETEROSCOPY/STENT PLACEMENT;  Surgeon: Adelbert Homans, MD;  Location: [facility];  Service: Urology;  Laterality: Left;   EXTRACORPOREAL SHOCK WAVE LITHOTRIPSY Left 01/24/2018   Procedure: EXTRACORPOREAL SHOCK WAVE LITHOTRIPSY (ESWL);  Surgeon: Osborn Blaze, MD;  Location: [facility];  Service: Urology;  Laterality: Left;   EXTRACORPOREAL SHOCK WAVE LITHOTRIPSY Right 02/07/2021   Procedure: EXTRACORPOREAL SHOCK WAVE LITHOTRIPSY (ESWL);  Surgeon: Samson Croak, MD;  Location: [facility];  Service: Urology;  Laterality: Right;   FOOT SURGERY  12/30/2005   and 2022 - plantar fascitits   HOLMIUM LASER APPLICATION Left 03/20/2012   Procedure: HOLMIUM LASER APPLICATION;  Surgeon: Soledad Dupes, MD;  Location[facility];  Service: Urology;  Laterality: Left;   TONSILLECTOMY  03/02/2010   WRIST SURGERY  11/30/2001    Social History   Tobacco Use  Smoking Status Former   Current packs/day: 0.00   Types: Cigarettes   Quit date: 01/31/1991   Years since quitting: 32.4  Smokeless Tobacco Never    Social History   Substance and Sexual Activity  Alcohol  Use Yes   Comment: occ    Family History  Problem Relation Age of Onset   Hyperlipidemia Mother    Hyperlipidemia Brother     Hyperlipidemia Brother    Colon cancer Neg Hx    Colon polyps Neg Hx    Esophageal cancer Neg Hx    Rectal cancer Neg Hx    Stomach cancer Neg Hx     Reviw of Systems: Noted in current history, otherwise review of systems is negative.      Physical Exam: Blood pressure 114/76, pulse 80, height 5' 10 (1.778 m), weight 225 lb 12.8 oz (102.4 kg), SpO2 98%.       GEN:  Well nourished, well developed in no acute distress HEENT: Normal NECK: No JVD; No carotid bruits LYMPHATICS: No lymphadenopathy CARDIAC: RRR , no murmurs, rubs, gallops RESPIRATORY:  Clear to auscultation without rales, wheezing or rhonchi  ABDOMEN: Soft, non-tender, non-distended MUSCULOSKELETAL:  No edema;  No deformity  SKIN: Warm and dry NEUROLOGIC:  Alert and oriented x 3       ECG:            Assessment / Plan:   1. Left bundle branch block -       2.   Chronic  Combined systolic  /  diastolic  CHF : Increase Entresto  to 97-103 twice a day.    She is on coreg , spiro Has tried Jardiance  but developed yeast infections   Upon my retirement I will have her follow-up with Dr. Veleta Gerold .     3.   HLD -      Labs have been stable  4. Palpitations -          Ahmad Alert, MD  07/20/2023 5:40 PM    [facility] [address] [place],  [address] [place], [place]  [address] Pager [phone] Phone: [phone]; Fax: [phone]

## 2023-07-20 ENCOUNTER — Encounter: Payer: Self-pay | Admitting: Cardiovascular Disease

## 2023-07-20 ENCOUNTER — Ambulatory Visit: Attending: Cardiovascular Disease | Admitting: Cardiovascular Disease

## 2023-07-20 VITALS — BP 114/76 | HR 80 | Ht 70.0 in | Wt 225.8 lb

## 2023-07-20 DIAGNOSIS — I5042 Chronic combined systolic (congestive) and diastolic (congestive) heart failure: Secondary | ICD-10-CM

## 2023-07-20 DIAGNOSIS — I447 Left bundle-branch block, unspecified: Secondary | ICD-10-CM

## 2023-07-20 NOTE — Patient Instructions (Signed)
 Follow-Up: At [facility], you and your health needs are our priority.  As part of our continuing mission to provide you with exceptional heart care, our providers are all part of one team.  This team includes your primary Cardiologist (physician) and Advanced Practice Providers or APPs (Physician Assistants and Nurse Practitioners) who all work together to provide you with the care you need, when you need it.  Your next appointment:   1 year(s)  Provider:   Gloriann Larger, MD

## 2023-07-23 ENCOUNTER — Encounter: Payer: Self-pay | Admitting: Sports Medicine

## 2023-07-23 ENCOUNTER — Ambulatory Visit (INDEPENDENT_AMBULATORY_CARE_PROVIDER_SITE_OTHER): Admitting: Sports Medicine

## 2023-07-23 DIAGNOSIS — M5136 Other intervertebral disc degeneration, lumbar region with discogenic back pain only: Secondary | ICD-10-CM

## 2023-07-23 DIAGNOSIS — M533 Sacrococcygeal disorders, not elsewhere classified: Secondary | ICD-10-CM | POA: Diagnosis not present

## 2023-07-23 DIAGNOSIS — G8929 Other chronic pain: Secondary | ICD-10-CM

## 2023-07-23 MED ORDER — MELOXICAM 15 MG PO TABS: 15.0000 mg | ORAL_TABLET | Freq: Every day | ORAL | 0 refills | Status: DC

## 2023-07-23 NOTE — Progress Notes (Addendum)
 Kendra [name] - 53 y.o. female MRN [id number]  Date of birth: January 23, 1971  Office Visit Note: Visit Date: 07/23/2023 PCP: Stephane Leita DEL, MD Referred by: Stephane Leita DEL, MD  Subjective: Chief Complaint  Patient presents with   Lower Back - Pain   HPI: Kendra [name] is a pleasant 53 y.o. female who presents today for acute on chronic right-sided low back pain.  She has had right-sided low back pain for a few years now.  She has been seen mostly at urgent cares or ED and has had x-rays which did not reveal significant pathology or give her a good diagnosis of her pathology.  A few weeks ago she did have a fall when she lost her balance holding a large umbrella and landed on her back/side. She did take an oral prednisone  pack and she states this did improve her pain vastly although after she finished her pain continues to come back.  She has had kidney stones in the past and recently was evaluated for this with a CT renal study which was negative.  Recommended following up with orthopedics or sports medicine.  She takes naproxen  or ibuprofen  only as needed which helps somewhat.  She denies any radicular pain going down the leg.  Pain is localized to the right low back pointing to the SI joint region.  She has not done any physical therapy in the past.  She has no personal or family history of rheumatologic conditions.  Note reviewed from [facility] urgent care on 01/21/2023 diagnosed with lumbar strain and treated with prednisone  and Flexeril .  Was recently seen at the [facility] health ED on 07/04/2023, note reviewed, negative for kidney stone or renal pathology.  Pertinent ROS were reviewed with the patient and found to be negative unless otherwise specified above in HPI.   Assessment & Plan: Visit Diagnoses:  1. Chronic right SI joint pain   2. Degeneration of intervertebral disc of lumbar region with discogenic back pain    Plan: Impression is acute on chronic right-sided low back pain with  symptoms and physical exam findings consistent with right SI joint pathology.  She does not have any significant SI joint arthritic change, so I believe this is moreso SI joint dysfunction with overlying connective tissue and ligamental insufficiency.  She does have mild upper lumbar DDD, but I do not think this is significantly contributing to her pain.  I would like to get her started in formalized physical therapy for the SI joint and the lumbar and pelvic stabilizers.  To reduce inflammatory burden, we will start her on a short course of meloxicam 15 mg which she will take daily with food for the next 2-3 weeks and then may discontinue.  We will see how she responds to the above treatment and I will see her back in about 6 weeks.  Additional treatment considerations: SI joint injection  Follow-up: Return in about 6 weeks (around 09/03/2023) for R-SI joint    Meds & Orders: No orders of the defined types were placed in this encounter.  No orders of the defined types were placed in this encounter.    Procedures: No procedures performed      Clinical History: No specialty comments available.  She reports that she quit smoking about 32 years ago. Her smoking use included cigarettes. She has never used smokeless tobacco. No results for input(s): HGBA1C, LABURIC in the last 8760 hours.  Objective:    Physical Exam  Gen: Well-appearing, in no  acute distress; non-toxic CV: Well-perfused. Warm.  Resp: Breathing unlabored on room air; no wheezing. Psych: Fluid speech in conversation; appropriate affect; normal thought process  Ortho Exam - Low back/Pelvis: There is no midline spinous process tenderness of the lumbar spine.  There is full range of motion with flexion and extension although there is worsening of pain on the right with extension.  There is poor activation of lower lumbar paraspinal musculature raising from ASIS flexed/seated position. + Fortin's point test on the right, SI joint  compression test on the right, positive Deri.  Both hips move fluidly with internal and external logroll.  Negative FADIR testing.  There is 4.5/5 weakness with right hip abduction testing compared to 5/5 strength of the contralateral side.  Imaging:  *Independent review and interpretation of complete lumbar x-ray from 01/21/2023 as well as CT renal scan from 07/03/2023 with attention to the musculoskeletal component was performed by myself today.  CT Renal Stone Study CLINICAL DATA:  Right-sided flank pain.  EXAM: CT ABDOMEN AND PELVIS WITHOUT CONTRAST  TECHNIQUE: Multidetector CT imaging of the abdomen and pelvis was performed following the standard protocol without IV contrast.  RADIATION DOSE REDUCTION: This exam was performed according to the departmental dose-optimization program which includes automated exposure control, adjustment of the mA and/or kV according to patient size and/or use of iterative reconstruction technique.  COMPARISON:  01/14/2018.  FINDINGS: Lower chest: No acute abnormality. No pericardial or pleural effusions.  Hepatobiliary: No focal liver abnormality is seen. Status post cholecystectomy. No biliary dilatation.  Pancreas: Unremarkable. No pancreatic ductal dilatation or surrounding inflammatory changes.  Spleen: Normal in size without focal abnormality.  Adrenals/Urinary Tract: Adrenal glands are unremarkable. Kidneys are normal, without renal calculi, focal lesion, or hydronephrosis. Bladder is unremarkable.  Stomach/Bowel: Stomach is within normal limits. Appendix appears normal. No evidence of bowel wall thickening, distention, or inflammatory changes.  Vascular/Lymphatic: No significant vascular findings are present. No enlarged abdominal or pelvic lymph nodes.  Reproductive: Uterus and bilateral adnexa are unremarkable. There is an IUD.  Other: No abdominal wall hernia or abnormality. No abdominopelvic ascites.  Musculoskeletal:  No acute or significant osseous findings.  IMPRESSION: No acute abdominal or pelvic pathology identified.  Electronically Signed   By: Fonda Field M.D.   On: 07/03/2023 22:39  Narrative & Impression  CLINICAL DATA:  Pain.   EXAM: LUMBAR SPINE - COMPLETE 4+ VIEW   COMPARISON:  None Available.   FINDINGS: No evidence for an acute fracture or subluxation. Loss of disc height with endplate spurring noted T12-L1. Facets are well aligned bilaterally. SI joints unremarkable.   IMPRESSION: Degenerative disc disease at T12-L1. No acute bony findings.     Electronically Signed   By: Camellia Candle M.D.   On: 01/21/2023 08:57    Past Medical/Family/Surgical/Social History: Medications & Allergies reviewed per EMR, new medications updated. 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   Anal skin tag 03/11/2019   COVID-19 12/17/2018   Eustachian tube dysfunction 03/10/2015   Acute bronchitis 04/28/2014   Diarrhea 11/18/2013   Right flank pain 11/18/2013   RLQ abdominal pain 11/18/2013   Routine general medical examination at a  health care facility 08/06/2013   Palpitations 04/22/2013   Chronic systolic congestive heart failure (HCC) 08/16/2011   Left bundle branch block 05/08/2011   Pleuritic chest pain 04/07/2011   Lower urinary tract infectious disease 03/15/2011   Ureteral stone 03/15/2011   Screening for HIV (human immunodeficiency virus) 09/16/2010   WEIGHT LOSS-ABNORMAL 01/11/2009   DEPRESSION 10/20/2008   HYPERLIPIDEMIA 06/22/2007   HEADACHE, CHRONIC 06/22/2007   NEPHROLITHIASIS, HX OF 06/22/2007   Hyperlipidemia 06/22/2007   ECZEMA 03/25/2007   Past  Medical History:  Diagnosis Date   Allergy    Chronic headache    Former smoker    quit in 17   GERD (gastroesophageal reflux disease)    History of kidney stones    Hyperlipidemia    Left bundle branch block    Obesity    Systolic dysfunction, left ventricle    EF is 43% per Myoview ; dyssynchrony of the septum noted on echo; has LBBB   Family History  Problem Relation Age of Onset   Hyperlipidemia Mother    Hyperlipidemia Brother    Hyperlipidemia Brother    Colon cancer Neg Hx    Colon polyps Neg Hx    Esophageal cancer Neg Hx    Rectal cancer Neg Hx    Stomach cancer Neg Hx    Past Surgical History:  Procedure Laterality Date   CHOLECYSTECTOMY  01/31/2007   CYSTOSCOPY WITH RETROGRADE PYELOGRAM, URETEROSCOPY AND STENT PLACEMENT  03/06/2012   Procedure: CYSTOSCOPY WITH RETROGRADE PYELOGRAM, URETEROSCOPY AND STENT PLACEMENT;  Surgeon: Toribio Neysa Repine, MD;  [facility];  Service: Urology;  Laterality: Left;   CYSTOSCOPY WITH RETROGRADE PYELOGRAM, URETEROSCOPY AND STENT PLACEMENT Left 03/20/2012   Procedure: CYSTOSCOPY WITH RETROGRADE PYELOGRAM, URETEROSCOPY AND STENT PLACEMENT;  Surgeon: Toribio Neysa Repine, MD;  Location[facility];  Service: Urology;  Laterality: Left;  balloon dilitation   CYSTOSCOPY/URETEROSCOPY/HOLMIUM LASER/STENT PLACEMENT Left 03/08/2018   Procedure: CYSTOSCOPY/RETROGRADE/URETEROSCOPY/STENT PLACEMENT;  Surgeon: Devere Lonni Righter, MD;  Location: [facility];  Service: Urology;  Laterality: Left;   EXTRACORPOREAL SHOCK WAVE LITHOTRIPSY Left 01/24/2018   Procedure: EXTRACORPOREAL SHOCK WAVE LITHOTRIPSY (ESWL);  Surgeon: Alvaro Hummer, MD;  Location: [facility];  Service: Urology;  Laterality: Left;   EXTRACORPOREAL SHOCK WAVE LITHOTRIPSY Right 02/07/2021   Procedure: EXTRACORPOREAL SHOCK WAVE LITHOTRIPSY (ESWL);  Surgeon: Carolee Sherwood JONETTA DOUGLAS, MD;  Location: [facility];  Service: Urology;   Laterality: Right;   FOOT SURGERY  12/30/2005   and 2022 - plantar fascitits   HOLMIUM LASER APPLICATION Left 03/20/2012   Procedure: HOLMIUM LASER APPLICATION;  Surgeon: Toribio Neysa Repine, MD;  Location: [facility];  Service: Urology;  Laterality: Left;   TONSILLECTOMY  03/02/2010   WRIST SURGERY  11/30/2001   Social History   Occupational History   Occupation: [employer]  Tobacco Use   Smoking status: Former    Current packs/day: 0.00    Types: Cigarettes    Quit date: 01/31/1991    Years since quitting: 32.4   Smokeless tobacco: Never  Vaping Use   Vaping status: Never Used  Substance and Sexual Activity   Alcohol  use: Yes    Comment: occ   Drug use: No   Sexual activity: Not on file

## 2023-07-23 NOTE — Progress Notes (Signed)
 Patient says that she has had right-sided low back pain for years. She says that her x-rays have never shown any abnormalities. She has been to Urgent Care and the ED where she has taken oral steroids that have always given her relief. Her pain does return when she stops taking the steroids. About three weeks ago, she fell onto a chair. She says that she was out of work for 3 days due to pain. Her pain felt similar to past kidney stones, which she has had multiple surgeries for in the past. She has never done physical therapy for her back pain specifically. She has tried heat, but does not use this often as it does not give her much relief. She has taken Naproxen  and Ibuprofen  only as needed. Patient denies any pain, numbness, or tingling down the legs.

## 2023-08-08 DIAGNOSIS — L821 Other seborrheic keratosis: Secondary | ICD-10-CM | POA: Diagnosis not present

## 2023-08-08 DIAGNOSIS — L570 Actinic keratosis: Secondary | ICD-10-CM | POA: Diagnosis not present

## 2023-08-30 ENCOUNTER — Other Ambulatory Visit: Payer: Self-pay | Admitting: Sports Medicine

## 2023-10-14 ENCOUNTER — Other Ambulatory Visit: Payer: Self-pay | Admitting: Sports Medicine

## 2023-10-25 ENCOUNTER — Ambulatory Visit (INDEPENDENT_AMBULATORY_CARE_PROVIDER_SITE_OTHER): Admitting: Psychology

## 2023-10-25 DIAGNOSIS — Z630 Problems in relationship with spouse or partner: Secondary | ICD-10-CM

## 2023-10-25 DIAGNOSIS — Z639 Problem related to primary support group, unspecified: Secondary | ICD-10-CM

## 2023-10-25 DIAGNOSIS — F4323 Adjustment disorder with mixed anxiety and depressed mood: Secondary | ICD-10-CM | POA: Diagnosis not present

## 2023-10-25 NOTE — Progress Notes (Signed)
 [facility] Counselor Initial Adult Exam  Name: Kendra [name] Date: 10/25/2023 MRN: [id number] DOB: Jan 22, 1971 PCP: Stephane Leita DEL, MD  Time spent: 60 minutes  Time in: 9:00  Time out: 10:00  Guardian/Payee:  self    Paperwork requested: No   Reason for Visit /Presenting Problem: The patient came in to the office with her partner for couples counseling.  They report that they want to be able to be proactive and make sure that they are taking care of their relationship the way they need to.  Mental Status Exam: Appearance:   Casual     Behavior:  Appropriate  Motor:  Normal  Speech/Language:   normal  Affect:  Appropriate  Mood:  normal  Thought process:  normal  Thought content:    WNL  Sensory/Perceptual disturbances:    WNL  Orientation:  oriented to person, place, time/date, and situation  Attention:  Good  Concentration:  Good  Memory:  WNL  Fund of knowledge:   Good  Insight:    Good  Judgment:   Good  Impulse Control:  Good     Reported Symptoms: The patient reports that and her partner are doing relatively well but they do have some issues that they feel like they need to work on.  Sometimes they have arguments which is basically their main symptom and they want to find out how to indicate in a much better way so that they are not getting into arguments seem to go anywhere.  Risk Assessment: Danger to Self:  No Self-injurious Behavior: No Danger to Others: No Duty to Warn:no Physical Aggression / Violence:No  Access to Firearms a concern: No  Gang Involvement:No  Patient / guardian was educated about steps to take if suicide or homicide risk level increases between visits: n/a While future psychiatric events cannot be accurately predicted, the patient does not currently require acute inpatient psychiatric care and does not currently meet [place]  involuntary commitment criteria.  Substance Abuse History: Current substance abuse: No      Past Psychiatric History:   No previous psychological problems have been observed Outpatient Providers:n/a History of Psych Hospitalization: No  Psychological Testing: n/a   Abuse History:  Victim of: No., n/a   Report needed: No. Victim of Neglect:No. Perpetrator of n/a  Witness / Exposure to Domestic Violence: No   Protective Services Involvement: No  Witness to [employer] Violence:  No   Family History:  Family History  Problem Relation Age of Onset   Hyperlipidemia Mother    Hyperlipidemia Brother    Hyperlipidemia Brother    Colon cancer Neg Hx    Colon polyps Neg Hx    Esophageal cancer Neg Hx    Rectal cancer Neg Hx    Stomach cancer Neg Hx     Living situation: the patient lives with their family  Sexual Orientation: Straight  Relationship Status: divorced  Name of spouse / other:Donnie Coghill If a parent, number of children / ages:Patient has two sons - Leontine who is 50 years old and Rankin who is 40 years old.  Her partner has a grown son who is 79 and he is out of the house.  Support Systems: significant other friends parents  Financial Stress:  No   Income/Employment/Disability: Employment  Financial planner: No   Educational History: Education: Risk manager: Protestant  Any cultural differences that may affect / interfere with treatment:  not applicable   Recreation/Hobbies: unknown  Stressors: Marital or  family conflict    Strengths: Supportive Relationships, Family, Friends, Journalist, newspaper, and Able to Communicate Effectively  Barriers:  none  Legal History: Pending legal issue / charges: The patient has no significant history of legal issues. History of legal issue / charges: n/a  Medical History/Surgical History: reviewed Past Medical History:  Diagnosis Date   Allergy    Chronic headache    Former smoker    quit in 76   GERD (gastroesophageal reflux disease)    History of kidney stones     Hyperlipidemia    Left bundle branch block    Obesity    Systolic dysfunction, left ventricle    EF is 43% per Myoview ; dyssynchrony of the septum noted on echo; has LBBB    Past Surgical History:  Procedure Laterality Date   CHOLECYSTECTOMY  01/31/2007   CYSTOSCOPY WITH RETROGRADE PYELOGRAM, URETEROSCOPY AND STENT PLACEMENT  03/06/2012   Procedure: CYSTOSCOPY WITH RETROGRADE PYELOGRAM, URETEROSCOPY AND STENT PLACEMENT;  Surgeon: Toribio Neysa Repine, MD;  Location: [facility];  Service: Urology;  Laterality: Left;   CYSTOSCOPY WITH RETROGRADE PYELOGRAM, URETEROSCOPY AND STENT PLACEMENT Left 03/20/2012   Procedure: CYSTOSCOPY WITH RETROGRADE PYELOGRAM, URETEROSCOPY AND STENT PLACEMENT;  Surgeon: Toribio Neysa Repine, MD;  Location: [facility];  Service: Urology;  Laterality: Left;  balloon dilitation   CYSTOSCOPY/URETEROSCOPY/HOLMIUM LASER/STENT PLACEMENT Left 03/08/2018   Procedure: CYSTOSCOPY/RETROGRADE/URETEROSCOPY/STENT PLACEMENT;  Surgeon: Devere Lonni Righter, MD;  Location: [facility];  Service: Urology;  Laterality: Left;   EXTRACORPOREAL SHOCK WAVE LITHOTRIPSY Left 01/24/2018   Procedure: EXTRACORPOREAL SHOCK WAVE LITHOTRIPSY (ESWL);  Surgeon: Alvaro Hummer, MD;  Location: [facility];  Service: Urology;  Laterality: Left;   EXTRACORPOREAL SHOCK WAVE LITHOTRIPSY Right 02/07/2021   Procedure: EXTRACORPOREAL SHOCK WAVE LITHOTRIPSY (ESWL);  Surgeon: Carolee Sherwood JONETTA DOUGLAS, MD;  Location: [facility];  Service: Urology;  Laterality: Right;   FOOT SURGERY  12/30/2005   and 2022 - plantar fascitits   HOLMIUM LASER APPLICATION Left 03/20/2012   Procedure: HOLMIUM LASER APPLICATION;  Surgeon: Toribio Neysa Repine, MD;  Location: [facility];  Service: Urology;  Laterality: Left;   TONSILLECTOMY  03/02/2010   WRIST SURGERY  11/30/2001    Medications: Current Outpatient Medications  Medication Sig Dispense Refill   carvedilol   (COREG ) 12.5 MG tablet Take 1 tablet by mouth twice daily. 180 tablet 3   colestipol  (COLESTID ) 1 g tablet TAKE 2 TABLETS BY MOUTH 2 TIMES DAILY. 360 tablet 1   cyclobenzaprine  (FLEXERIL ) 10 MG tablet Take 1 tablet (10 mg total) by mouth 2 (two) times daily as needed for muscle spasms. 20 tablet 0   famotidine  (PEPCID ) 40 MG tablet TAKE 1 TABLET BY MOUTH EVERYDAY AT BEDTIME 90 tablet 0   furosemide  (LASIX ) 20 MG tablet Take 1 tablet (20 mg total) by mouth daily as needed (for swelling or weight gain of 3 lbs overnight or 5 lbs in one week). 30 tablet 6   levonorgestrel  (LILETTA ) 19.5 MCG/DAY IUD IUD Liletta  19.5 mcg/24 hrs (5 yrs) 52 mg intrauterine device  Take 1 device by intrauterine route.     meloxicam  (MOBIC ) 15 MG tablet TAKE 1 TABLET (15 MG TOTAL) BY MOUTH DAILY. 30 tablet 0   methocarbamol  (ROBAXIN ) 500 MG tablet Take 1 tablet (500 mg total) by mouth 2 (two) times daily. 20 tablet 0   omeprazole  (PRILOSEC) 40 MG capsule Take 1 capsule (40 mg total) by mouth 2 (two) times daily. (Patient taking differently: Take 40 mg by  mouth 2 (two) times daily as needed (indegestion).) 90 capsule 3   potassium chloride  (KLOR-CON  M) 10 MEQ tablet Take 2 tablets (20 mEq total) by mouth daily as needed (take on days you take Lasix ). 30 tablet 6   rosuvastatin  (CRESTOR ) 10 MG tablet Take 1 tablet by mouth daily. 90 tablet 2   sacubitril -valsartan  (ENTRESTO ) 97-103 MG Take 1 tablet by mouth 2 (two) times daily. 60 tablet 11   spironolactone  (ALDACTONE ) 25 MG tablet Take 1 tablet by mouth daily. 90 tablet 2   No current facility-administered medications for this visit.    Allergies  Allergen Reactions   Codeine Nausea And Vomiting   Penicillin G     Other Reaction(s): Unknown   Penicillins Hives    Did it involve swelling of the face/tongue/throat, SOB, or low BP? No Did it involve sudden or severe rash/hives, skin peeling, or any reaction on the inside of your mouth or nose? No Did you need to seek  medical attention at a hospital or doctor's office? No When did it last happen?      Childhood allergy If all above answers are "NO", may proceed with cephalosporin use.     Diagnoses:  Adjustment disorder with mixed anxiety and depressed mood  Relationship problems  Plan of Care: Treatment Plan Client Abilities/Strengths Intelligent, strong marriage, good insight and judgment Client Treatment Preferences Marriage counseling Client Statement of Needs My husband does not know how to take care of me,  I have always been the one taking care of everyone else, and now I need someone to take care of me Treatment Level Outpatient couples therapy Symptoms  Difficulty resolving problems.: (Status: maintained).  Infrequent communication with partner regarding intimate matters.:  (Status: maintained ) (Status: maintained). Problems Addressed Communication, Communication, Loss of Love/Affection, Loss of Love/Affection, Loss of Love/Affection, Loss of Love/Affection, Communication, Depression Due to Relationship Problems, Depression Due to Relationship Problems  Goals 1. Each partner listens to and understands the other partner's perspective. 2. Each partner notices and verbalizes appreciation to the other for acts of kindness, thoughtfulness, and caring.    Target Date: 10/24/2024 Frequency: weekly to bi weekly Progress: 0 Modality: individual Objective Verbalize realistic, attainable time goals for expression of feelings of affection, and intimacy. Target Date: 10/24/2024 Frequency: weekly to bi weekly Progress: 0 Modality: couples Related Interventions 1. Have the partners provide feedback to each other about specific actions that will help build mutual trust. 2. Reinforce attempts by clients to be verbally supportive of each other. 3. Obtain an agreement from the partners regarding specific helpful, thoughtful gestures each will perform during the week . 4. Teach the  partners to agree to their mutual satisfaction that a problem has been correctly pinpointed before actually trying to solve the problem; have them practice this in session on areas of conflict. Objective Commit to moving forward and to letting go of past issues. Target Date: 10/24/2024 Frequency: weekly to bi weekly Progress: 0 Modality: couples Related Interventions 1. Obtain a verbal commitment between the partners and the therapist to attempt to move forward and to put past issues behind them, at least for a specified period. 2. Teach the partners to maintain eye contact with each other while speaking and listening. Objective Increase eye contact and communication with the partner. Target Date: 10/24/2024 Frequency: weekly to bi weekly Progress: 0 Modality: individual Related Interventions 1. Provide feedback to the partners about the extent to which each maintains eye contact with the other and communicates needs.  2. Obtain a commitment from the partners to minimize critical and hostile comments toward each other (or assign Alternatives to Destructive Anger in the Adult Psychotherapy Homework Planner, 2nd ed. by Jenniffer). Objective Identify activities that self and the partner would enjoy together. Target Date: 10/24/2024 Frequency: weekly to bi weekly Progress: 0 Modality: couples 6. Increase time spent together in social and recreational activities. 7. Partners discuss and resolve problems effectively without verbal fighting.  Objective Practice problem-solving techniques within the session. Target Date: 10/24/2024 Frequency: weekly to bi weekly Progress: 0 Modality: couples  Objective Make positive statements to self and others about the future.  Target Date: 10/24/2024 Frequency: weekly to biweekly Progress: 0 Modality: couples Objective Verbalize own level of commitment to remain in the relationship. Target Date: 10/24/2024 Frequency: Weekly to biweekly Progress:  0 Modality: couples   Objective Describe overall level of satisfaction with the relationship. Target Date: 10/24/2024 Frequency: weekly to biweekly Progress: 0 Modality: Couples  Related Interventions 1. Explore each partner's thoughts and feelings concerning the relationship. Diagnosis  F43.23  Adjustment disorder with Mixed anxiety and depression Z63.9 (Relationship distress with spouse or intimate partner) - Open - [Signifier: n/a] Relationship Distress with Spouse or Intimate Partner Conditions For Discharge Achievement of treatment goals and objectives    Acasia Skilton G Remee Charley, LCSW

## 2023-11-02 ENCOUNTER — Ambulatory Visit: Admitting: Psychology

## 2023-11-02 DIAGNOSIS — F4323 Adjustment disorder with mixed anxiety and depressed mood: Secondary | ICD-10-CM | POA: Diagnosis not present

## 2023-11-02 DIAGNOSIS — Z630 Problems in relationship with spouse or partner: Secondary | ICD-10-CM

## 2023-11-02 DIAGNOSIS — Z639 Problem related to primary support group, unspecified: Secondary | ICD-10-CM

## 2023-11-04 NOTE — Progress Notes (Signed)
 [facility] Counselor/Therapist Progress Note  Patient ID: NIEMA CARRARA, MRN: [id number],    Date: 11/04/2023  Time Spent: 120 minutes Time in:9:00  Time out: 11:00  Treatment Type: Family with patient  Reported Symptoms: sadness, conflict  Mental Status Exam: Appearance:  Casual     Behavior: Appropriate  Motor: Normal  Speech/Language:  Clear and Coherent  Affect: Appropriate  Mood: normal  Thought process: normal  Thought content:   WNL  Sensory/Perceptual disturbances:   WNL  Orientation: oriented to person, place, time/date, and situation  Attention: Good  Concentration: Good  Memory: WNL  Fund of knowledge:  Good  Insight:   Good  Judgment:  Good  Impulse Control: Good   Risk Assessment: Danger to Self:  No Self-injurious Behavior: No Danger to Others: No Duty to Warn:no Physical Aggression / Violence:No  Access to Firearms a concern: No  Gang Involvement:No   Subjective: Saw both the patient and her significant other in the office today to gather a little more history about their relationship and to see what it is they want to work on in therapy.  It seems that she is more interested in having more conversations and working through problems and learning how to have better coping strategies to do that.  He seems to be more interested and negotiating what he feels like might be injustices or unfairness because they are living in the same home and her children live with them and he feels like they are old enough to be out on their own.  We will have to work on helping them understand how to communicate better and how to talk about the things that they need as they do not do a lot of that at this point in time.  Interventions: Cognitive Behavioral Therapy, Assertiveness/Communication, Family Systems, and Interpersonal  Diagnosis:Adjustment disorder with mixed anxiety and depressed mood  Relationship problems  Plan: Plan of Care: Treatment  Plan Client Abilities/Strengths Intelligent, strong marriage, good insight and judgment Client Treatment Preferences Marriage counseling Client Statement of Needs We want to work on helping our relationship be strong Treatment Level Outpatient couples therapy Symptoms  Difficulty resolving problems.: (Status: maintained).  Infrequent communication with partner regarding intimate matters.:  (Status: maintained ) (Status: maintained). Problems Addressed Communication, Communication, Loss of Love/Affection, Depression Due to Relationship Problems  Goals 1. Each partner listens to and understands the other partner's perspective. 2. Each partner notices and verbalizes appreciation to the other for acts of kindness, thoughtfulness, and caring.    Target Date: 10/24/2024 Frequency: weekly to bi weekly Progress: 0 Modality: individual Objective Verbalize realistic, attainable time goals for expression of feelings of affection, and intimacy. Target Date: 10/24/2024 Frequency: weekly to bi weekly Progress: 0 Modality: couples Related Interventions 1. Have the partners provide feedback to each other about specific actions that will help build mutual trust. 2. Reinforce attempts by clients to be verbally supportive of each other. 3. Obtain an agreement from the partners regarding specific helpful, thoughtful gestures each will perform during the week . 4. Teach the partners to agree to their mutual satisfaction that a problem has been correctly pinpointed before actually trying to solve the problem; have them practice this in session on areas of conflict. Objective Commit to moving forward and to letting go of past issues. Target Date: 10/24/2024 Frequency: weekly to bi weekly Progress: 0 Modality: couples Related Interventions 1. Obtain a verbal commitment between the partners and the therapist to attempt to move forward and to put  past issues behind them, at least for a specified  period. 2. Teach the partners to maintain eye contact with each other while speaking and listening. Objective Increase eye contact and communication with the partner. Target Date: 10/24/2024 Frequency: weekly to bi weekly Progress: 0 Modality: individual Related Interventions 1. Provide feedback to the partners about the extent to which each maintains eye contact with the other and communicates needs.  2. Obtain a commitment from the partners to minimize critical and hostile comments toward each other (or assign Alternatives to Destructive Anger in the Adult Psychotherapy Homework Planner, 2nd ed. by Jenniffer). Objective Identify activities that self and the partner would enjoy together. Target Date: 10/24/2024 Frequency: weekly to bi weekly Progress: 0 Modality: couples 6. Increase time spent together in social and recreational activities. 7. Partners discuss and resolve problems effectively without verbal fighting.  Objective Practice problem-solving techniques within the session. Target Date: 10/24/2024 Frequency: weekly to bi weekly Progress: 0 Modality: couples  Objective Make positive statements to self and others about the future.  Target Date: 10/24/2024 Frequency: weekly to biweekly Progress: 0 Modality: couples Objective Verbalize own level of commitment to remain in the relationship. Target Date: 10/24/2024 Frequency: Weekly to biweekly Progress: 0 Modality: couples   Objective Describe overall level of satisfaction with the relationship. Target Date: 10/24/2024 Frequency: weekly to biweekly Progress: 0 Modality: Couples  Related Interventions 1. Explore each partner's thoughts and feelings concerning the relationship. Diagnosis  F43.23  Adjustment disorder with Mixed anxiety and depression Z63.9 (Relationship distress with spouse or intimate partner) - Open - [Signifier: n/a] Relationship Distress with Spouse or Intimate Partner Conditions For  Discharge Achievement of treatment goals and objectives  Naleyah Ohlinger G Mikhael Hendriks, LCSW                  Icy Fuhrmann G Ruthellen Tippy, LCSW

## 2023-11-12 ENCOUNTER — Other Ambulatory Visit: Payer: Self-pay

## 2023-11-12 ENCOUNTER — Encounter: Payer: Self-pay | Admitting: Sports Medicine

## 2023-11-12 ENCOUNTER — Ambulatory Visit (INDEPENDENT_AMBULATORY_CARE_PROVIDER_SITE_OTHER): Admitting: Sports Medicine

## 2023-11-12 DIAGNOSIS — M5136 Other intervertebral disc degeneration, lumbar region with discogenic back pain only: Secondary | ICD-10-CM | POA: Diagnosis not present

## 2023-11-12 DIAGNOSIS — G8929 Other chronic pain: Secondary | ICD-10-CM

## 2023-11-12 DIAGNOSIS — M545 Low back pain, unspecified: Secondary | ICD-10-CM

## 2023-11-12 DIAGNOSIS — M533 Sacrococcygeal disorders, not elsewhere classified: Secondary | ICD-10-CM

## 2023-11-12 DIAGNOSIS — M7918 Myalgia, other site: Secondary | ICD-10-CM | POA: Diagnosis not present

## 2023-11-12 DIAGNOSIS — M25561 Pain in right knee: Secondary | ICD-10-CM | POA: Diagnosis not present

## 2023-11-12 NOTE — Progress Notes (Signed)
 Patient says that she is having quite a bit of pain. She has been doing physical therapy exercises and stretches that she found online, although they do not seem to be helping. She says that standing for longer than 15 minutes, rolling over in bed, and moving into any back extension seem to worsen her pain. She is open to an injection today, if advised that it would be helpful. She does mention that her back between her bra line and her hips has been very painful to the touch of her shirt over the last couple of months.  Patient says that a week and a half ago she twisted and felt/heard a pop in her right knee while walking. She has had sharp pain with specific movements in the time since then. She says that her back/SI joint pain is her priority today, but would like to look at her knee as well.

## 2023-11-12 NOTE — Progress Notes (Signed)
 Kendra [name] - 53 y.o. female MRN [id number]  Date of birth: 1970-10-08  Office Visit Note: Visit Date: 11/12/2023 PCP: Stephane Leita DEL, MD Referred by: Stephane Leita DEL, MD  Subjective: Chief Complaint  Patient presents with   Lower Back - Follow-up   HPI: Kendra [name] is a pleasant 53 y.o. female who presents today for follow-up of chronic right-sided low back/SI-joint pain.  Kendra [name] is having quite a bit of pain in the low back in the SI joint region.  I did see her back in June and diagnosed her with more pain from the SI joint complex.  She had not been able to get scheduled in formalized physical therapy but has been doing stretches and rehab exercises she has been researching online.  Initially meloxicam  15 mg daily was helpful in the first few weeks, she is still taking but not noticing as much relief.  She is seeking chiropractic care as well.  Just over a week ago she twisted and felt a pop in the right knee, she was able to continue walking and did not have much pain at that time until the following day that she felt pain in the knee.  This is certainly not as bothersome as her low back however.  She is taking meloxicam  15 mg daily but no additional treatment specifically for the knee.  She never has had issues with her knees in the past.  Pertinent ROS were reviewed with the patient and found to be negative unless otherwise specified above in HPI.   Assessment & Plan: Visit Diagnoses:  1. Chronic right SI joint pain   2. Degeneration of intervertebral disc of lumbar region with discogenic back pain   3. Myofascial low back pain   4. Pain in right buttock   5. Acute pain of right knee    Plan: Impression is acute on chronic right sided SI joint pain and dysfunction.  She also has reciprocal lumbar paraspinal hypertonicity but only mild lumbar DDD underlying.  Through shared decision making, we did proceed with ultrasound-guided right SI joint injection both for diagnostic  and therapeutic purposes, patient tolerated well.  Advised on postinjection protocol.  We will get her started in formalized physical therapy for the SI joint complex but also to work on the lumbar paraspinal musculature in both low back and pelvic stability.  I would like her to continue her meloxicam  15 mg through the rest of this week only and then discontinue going forward.  In terms of her right knee, she does have some pain and mild swelling but this is not significantly bothersome to her at this point.  Possible knee sprain (cannot r/o meniscal), will reevaluate this in her SI joint back in the next 4-6 weeks.  She may call or return earlier if any issues arise.  With the x-rays if still having pain at that time.  Follow-up: Return in about 4-6 weeks (around 12/17/2023) for R-SI joint, knee f/u  Meds & Orders: No orders of the defined types were placed in this encounter.   Orders Placed This Encounter  Procedures   US  Guided Needle Placement - No Linked Charges   Ambulatory referral to Physical Therapy     Procedures: U/S-guided SI-joint injection, Right   After discussion of risk/benefits/indications, informed verbal consent was obtained. A timeout was then performed. The patient was positioned in a prone position on exam room table with a pillow placed under the pelvis for mild hip flexion. The SI  joint area was cleaned and prepped with betadine and alcohol  swabs. Sterile ultrasound gel was applied and the ultrasound transducer was placed in an anatomic axial plane over the PSIS, then moved distally over the SI-joint. Using ultrasound guidance, a 22-gauge, 3.5 needle was inserted from a medial to lateral approach utilizing an in-plane approach and directed into the SI-joint. The SI-joint was then injected with a mixture of 4:2 lidocaine :depomedrol with visualization of the injectate flow into the SI-joint under ultrasound visualization. The patient tolerated the procedure well without  immediate complications.       Clinical History: No specialty comments available.  She reports that she quit smoking about 32 years ago. Her smoking use included cigarettes. She has never used smokeless tobacco. No results for input(s): HGBA1C, LABURIC in the last 8760 hours.  Objective:    Physical Exam  Gen: Well-appearing, in no acute distress; non-toxic CV: Well-perfused. Warm.  Resp: Breathing unlabored on room air; no wheezing. Psych: Fluid speech in conversation; appropriate affect; normal thought process  Ortho Exam - Low back/SI joint: + TTP over the right SI joint just inferior to the PSIS.  Positive SI joint compression test and Fortin's point test.  There is no midline spinous process TTP of the lumbar spine.  There is right greater than left lumbar paraspinal hypertonicity.  Lumbar extension does worsen her pain in her back and SI joint, full range lumbar flexion.  5/5 strength in bilateral lower extremities.  - Right knee: Positive medial joint line TTP.  There is mild swelling about the knee joint although no significant effusion.  There is pain with endrange flexion.  Imaging:  Narrative & Impression  CLINICAL DATA:  Pain.   EXAM: LUMBAR SPINE - COMPLETE 4+ VIEW   COMPARISON:  None Available.   FINDINGS: No evidence for an acute fracture or subluxation. Loss of disc height with endplate spurring noted T12-L1. Facets are well aligned bilaterally. SI joints unremarkable.   IMPRESSION: Degenerative disc disease at T12-L1. No acute bony findings.     Electronically Signed   By: Camellia Candle M.D.   On: 01/21/2023 08:57    Past Medical/Family/Surgical/Social History: Medications & Allergies reviewed per EMR, new medications updated. 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   Anal skin tag 03/11/2019   COVID-19 12/17/2018   Eustachian tube dysfunction 03/10/2015   Acute bronchitis 04/28/2014   Diarrhea 11/18/2013   Right flank pain 11/18/2013   RLQ abdominal pain 11/18/2013   Routine general medical examination at a health care facility 08/06/2013   Palpitations 04/22/2013   Chronic systolic congestive heart failure (HCC) 08/16/2011   Left bundle branch block 05/08/2011   Pleuritic chest pain 04/07/2011   Lower urinary tract infectious disease 03/15/2011   Ureteral stone 03/15/2011   Screening for HIV (human immunodeficiency virus) 09/16/2010   WEIGHT LOSS-ABNORMAL 01/11/2009   DEPRESSION 10/20/2008   HYPERLIPIDEMIA 06/22/2007   HEADACHE, CHRONIC 06/22/2007   NEPHROLITHIASIS, HX OF 06/22/2007   Hyperlipidemia 06/22/2007   ECZEMA 03/25/2007   Past Medical History:  Diagnosis Date   Allergy    Chronic headache    Former smoker    quit in 61  GERD (gastroesophageal reflux disease)    History of kidney stones    Hyperlipidemia    Left bundle branch block    Obesity    Systolic dysfunction, left ventricle    EF is 43% per Myoview ; dyssynchrony of the septum noted on echo; has LBBB   Family History  Problem Relation Age of Onset   Hyperlipidemia Mother    Hyperlipidemia Brother    Hyperlipidemia Brother    Colon cancer Neg Hx    Colon polyps Neg Hx    Esophageal cancer Neg Hx    Rectal cancer Neg Hx    Stomach cancer Neg Hx    Past Surgical History:  Procedure Laterality Date   CHOLECYSTECTOMY  01/31/2007   CYSTOSCOPY WITH RETROGRADE PYELOGRAM, URETEROSCOPY AND STENT PLACEMENT  03/06/2012   Procedure: CYSTOSCOPY WITH RETROGRADE PYELOGRAM, URETEROSCOPY AND STENT PLACEMENT;  Surgeon: Toribio Neysa Repine, MD;  Location: [facility];  Service: Urology;  Laterality: Left;   CYSTOSCOPY WITH  RETROGRADE PYELOGRAM, URETEROSCOPY AND STENT PLACEMENT Left 03/20/2012   Procedure: CYSTOSCOPY WITH RETROGRADE PYELOGRAM, URETEROSCOPY AND STENT PLACEMENT;  Surgeon: Toribio Neysa Repine, MD;  Location: [facility];  Service: Urology;  Laterality: Left;  balloon dilitation   CYSTOSCOPY/URETEROSCOPY/HOLMIUM LASER/STENT PLACEMENT Left 03/08/2018   Procedure: CYSTOSCOPY/RETROGRADE/URETEROSCOPY/STENT PLACEMENT;  Surgeon: Devere Lonni Righter, MD;  Location: [facility];  Service: Urology;  Laterality: Left;   EXTRACORPOREAL SHOCK WAVE LITHOTRIPSY Left 01/24/2018   Procedure: EXTRACORPOREAL SHOCK WAVE LITHOTRIPSY (ESWL);  Surgeon: Alvaro Hummer, MD;  Location: [facility];  Service: Urology;  Laterality: Left;   EXTRACORPOREAL SHOCK WAVE LITHOTRIPSY Right 02/07/2021   Procedure: EXTRACORPOREAL SHOCK WAVE LITHOTRIPSY (ESWL);  Surgeon: Carolee Sherwood JONETTA DOUGLAS, MD;  Location: [facility];  Service: Urology;  Laterality: Right;   FOOT SURGERY  12/30/2005   and 2022 - plantar fascitits   HOLMIUM LASER APPLICATION Left 03/20/2012   Procedure: HOLMIUM LASER APPLICATION;  Surgeon: Toribio Neysa Repine, MD;  Location: [facility];  Service: Urology;  Laterality: Left;   TONSILLECTOMY  03/02/2010   WRIST SURGERY  11/30/2001   Social History   Occupational History   Occupation: [employer]  Tobacco Use   Smoking status: Former    Current packs/day: 0.00    Types: Cigarettes    Quit date: 01/31/1991    Years since quitting: 32.8   Smokeless tobacco: Never  Vaping Use   Vaping status: Never Used  Substance and Sexual Activity   Alcohol  use: Yes    Comment: occ   Drug use: No   Sexual activity: Not on file

## 2023-11-15 ENCOUNTER — Ambulatory Visit: Admitting: Psychology

## 2023-11-15 DIAGNOSIS — F4323 Adjustment disorder with mixed anxiety and depressed mood: Secondary | ICD-10-CM

## 2023-11-15 DIAGNOSIS — Z630 Problems in relationship with spouse or partner: Secondary | ICD-10-CM | POA: Diagnosis not present

## 2023-11-15 DIAGNOSIS — Z639 Problem related to primary support group, unspecified: Secondary | ICD-10-CM

## 2023-11-16 NOTE — Progress Notes (Signed)
 [facility] Counselor/Therapist Progress Note  Patient ID: JALA DUNDON, MRN: [id number],    Date: 11/15/2023  Time Spent: 60 minutes Time in:9:00  Time out: 10:00  Treatment Type: Family with patient  Reported Symptoms: sadness, conflict  Mental Status Exam: Appearance:  Casual     Behavior: Appropriate  Motor: Normal  Speech/Language:  Clear and Coherent  Affect: Appropriate  Mood: normal  Thought process: normal  Thought content:   WNL  Sensory/Perceptual disturbances:   WNL  Orientation: oriented to person, place, time/date, and situation  Attention: Good  Concentration: Good  Memory: WNL  Fund of knowledge:  Good  Insight:   Good  Judgment:  Good  Impulse Control: Good   Risk Assessment: Danger to Self:  No Self-injurious Behavior: No Danger to Others: No Duty to Warn:no Physical Aggression / Violence:No  Access to Firearms a concern: No  Gang Involvement:No   Subjective: The patient and her partner came in today for a couples counseling session.  They both were pleasant and cooperative during the session.  We did some education today about expectations of therapy and what therapy is about in regards to couples counseling.  We talked about the need for each 1 to focus on themselves and how that they are interacting in the relationship and also to be aware and consider that relationships are about growth and service as opposed to ease in self.  We began talking about the importance of appropriate communications with each other and being mindful of tone and communication that is nonverbal.  They seem to be very open to doing the work they need to do to help that relationship prosper. Interventions: Cognitive Behavioral Therapy, Assertiveness/Communication, Family Systems, and Interpersonal  Diagnosis:Adjustment disorder with mixed anxiety and depressed mood  Relationship problems  Plan: Plan of Care: Treatment Plan Client  Abilities/Strengths Intelligent, strong marriage, good insight and judgment Client Treatment Preferences Marriage counseling Client Statement of Needs We want to work on helping our relationship be strong Treatment Level Outpatient couples therapy Symptoms  Difficulty resolving problems.: (Status: maintained).  Infrequent communication with partner regarding intimate matters.:  (Status: maintained ) (Status: maintained). Problems Addressed Communication, Communication, Loss of Love/Affection, Depression Due to Relationship Problems  Goals 1. Each partner listens to and understands the other partner's perspective. 2. Each partner notices and verbalizes appreciation to the other for acts of kindness, thoughtfulness, and caring.    Target Date: 10/24/2024 Frequency: weekly to bi weekly Progress: 0 Modality: individual Objective Verbalize realistic, attainable time goals for expression of feelings of affection, and intimacy. Target Date: 10/24/2024 Frequency: weekly to bi weekly Progress: 0 Modality: couples Related Interventions 1. Have the partners provide feedback to each other about specific actions that will help build mutual trust. 2. Reinforce attempts by clients to be verbally supportive of each other. 3. Obtain an agreement from the partners regarding specific helpful, thoughtful gestures each will perform during the week . 4. Teach the partners to agree to their mutual satisfaction that a problem has been correctly pinpointed before actually trying to solve the problem; have them practice this in session on areas of conflict. Objective Commit to moving forward and to letting go of past issues. Target Date: 10/24/2024 Frequency: weekly to bi weekly Progress: 0 Modality: couples Related Interventions 1. Obtain a verbal commitment between the partners and the therapist to attempt to move forward and to put past issues behind them, at least for a specified period. 2.  Teach the partners to maintain eye  contact with each other while speaking and listening. Objective Increase eye contact and communication with the partner. Target Date: 10/24/2024 Frequency: weekly to bi weekly Progress: 0 Modality: individual Related Interventions 1. Provide feedback to the partners about the extent to which each maintains eye contact with the other and communicates needs.  2. Obtain a commitment from the partners to minimize critical and hostile comments toward each other (or assign Alternatives to Destructive Anger in the Adult Psychotherapy Homework Planner, 2nd ed. by Jenniffer). Objective Identify activities that self and the partner would enjoy together. Target Date: 10/24/2024 Frequency: weekly to bi weekly Progress: 0 Modality: couples 6. Increase time spent together in social and recreational activities. 7. Partners discuss and resolve problems effectively without verbal fighting.  Objective Practice problem-solving techniques within the session. Target Date: 10/24/2024 Frequency: weekly to bi weekly Progress: 0 Modality: couples  Objective Make positive statements to self and others about the future.  Target Date: 10/24/2024 Frequency: weekly to biweekly Progress: 0 Modality: couples Objective Verbalize own level of commitment to remain in the relationship. Target Date: 10/24/2024 Frequency: Weekly to biweekly Progress: 0 Modality: couples   Objective Describe overall level of satisfaction with the relationship. Target Date: 10/24/2024 Frequency: weekly to biweekly Progress: 0 Modality: Couples  Related Interventions 1. Explore each partner's thoughts and feelings concerning the relationship. Diagnosis  F43.23  Adjustment disorder with Mixed anxiety and depression Z63.9 (Relationship distress with spouse or intimate partner) - Open - [Signifier: n/a] Relationship Distress with Spouse or Intimate Partner Conditions For Discharge Achievement  of treatment goals and objectives  Naria Abbey G Lashan Macias, LCSW                                 Savan Ruta G Jaasiel Hollyfield, LCSW

## 2023-11-19 ENCOUNTER — Other Ambulatory Visit: Payer: Self-pay | Admitting: Sports Medicine

## 2023-11-20 DIAGNOSIS — Z1389 Encounter for screening for other disorder: Secondary | ICD-10-CM | POA: Diagnosis not present

## 2023-11-20 DIAGNOSIS — Z0189 Encounter for other specified special examinations: Secondary | ICD-10-CM | POA: Diagnosis not present

## 2023-11-20 DIAGNOSIS — E785 Hyperlipidemia, unspecified: Secondary | ICD-10-CM | POA: Diagnosis not present

## 2023-11-23 ENCOUNTER — Ambulatory Visit: Admitting: Psychology

## 2023-11-26 ENCOUNTER — Encounter: Payer: Self-pay | Admitting: Sports Medicine

## 2023-11-27 DIAGNOSIS — E785 Hyperlipidemia, unspecified: Secondary | ICD-10-CM | POA: Diagnosis not present

## 2023-11-27 DIAGNOSIS — I5022 Chronic systolic (congestive) heart failure: Secondary | ICD-10-CM | POA: Diagnosis not present

## 2023-11-27 DIAGNOSIS — R5383 Other fatigue: Secondary | ICD-10-CM | POA: Diagnosis not present

## 2023-11-27 DIAGNOSIS — Z0000 Encounter for general adult medical examination without abnormal findings: Secondary | ICD-10-CM | POA: Diagnosis not present

## 2023-11-27 DIAGNOSIS — Z1339 Encounter for screening examination for other mental health and behavioral disorders: Secondary | ICD-10-CM | POA: Diagnosis not present

## 2023-11-27 DIAGNOSIS — Z1331 Encounter for screening for depression: Secondary | ICD-10-CM | POA: Diagnosis not present

## 2023-11-29 ENCOUNTER — Ambulatory Visit: Admitting: Psychology

## 2023-11-29 DIAGNOSIS — Z630 Problems in relationship with spouse or partner: Secondary | ICD-10-CM

## 2023-11-29 DIAGNOSIS — F4323 Adjustment disorder with mixed anxiety and depressed mood: Secondary | ICD-10-CM | POA: Diagnosis not present

## 2023-11-29 DIAGNOSIS — Z639 Problem related to primary support group, unspecified: Secondary | ICD-10-CM

## 2023-11-30 NOTE — Progress Notes (Signed)
 [facility] Counselor/Therapist Progress Note  Patient ID: Kendra [name], MRN: [id number],    Date: 11/29/2023  Time Spent: 60 minutes Time in:4:00  Time out:5:00  Treatment Type: Family with patient  Reported Symptoms: sadness, conflict  Mental Status Exam: Appearance:  Casual     Behavior: Appropriate  Motor: Normal  Speech/Language:  Clear and Coherent  Affect: Appropriate  Mood: normal  Thought process: normal  Thought content:   WNL  Sensory/Perceptual disturbances:   WNL  Orientation: oriented to person, place, time/date, and situation  Attention: Good  Concentration: Good  Memory: WNL  Fund of knowledge:  Good  Insight:   Good  Judgment:  Good  Impulse Control: Good   Risk Assessment: Danger to Self:  No Self-injurious Behavior: No Danger to Others: No Duty to Warn:no Physical Aggression / Violence:No  Access to Firearms a concern: No  Gang Involvement:No   Subjective: The patient and her partner came in today for a couples counseling session.  They both were pleasant and cooperative during the session.  Patient and her partner already reports that it seems like the therapy is helping.  They were able to talk about 2 situations that had arisen over the last few weeks and that it was apparent they both were trying to work with themselves on being able to respond in an appropriate way.  We talked today about several different things that came up during the initial assessment that we need to focus on.  I listed those out for the patient and asked them to prioritize what it is that they were wanting to start with.  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  He stated he wanted to work on his frustrations.  One of the things that has been very frustrating for him has been that her sons are now living in their home and they do not seem to be as respectful or responsible as he would feel like they need to be to her  or work with him.  I brought that up and he was not quite ready to address that but we  will look at that pretty quickly because I do think that is a major issue for him. Interventions: Cognitive Behavioral Therapy, Assertiveness/Communication, Family Systems, and Interpersonal  Diagnosis:Adjustment disorder with mixed anxiety and depressed mood  Relationship problems  Plan: Plan of Care: Treatment Plan Client Abilities/Strengths Intelligent, strong marriage, good insight and judgment Client Treatment Preferences Marriage counseling Client Statement of Needs We want to work on helping our relationship be strong Treatment Level Outpatient couples therapy Symptoms  Difficulty resolving problems.: (Status: maintained).  Infrequent communication with partner regarding intimate matters.:  (Status: maintained ) (Status: maintained). Problems Addressed Communication, Communication, Loss of Love/Affection, Depression Due to Relationship Problems  Goals 1. Each partner listens to and understands the other partner's perspective. 2. Each partner notices and verbalizes appreciation to the other for acts of kindness, thoughtfulness, and caring.    Target Date: 10/24/2024 Frequency: weekly to bi weekly Progress: 0 Modality: individual Objective Verbalize realistic, attainable time goals for expression of feelings of affection, and intimacy. Target Date: 10/24/2024 Frequency: weekly to bi weekly Progress: 0 Modality: couples Related Interventions 1. Have the partners provide feedback to each other about specific actions that will help build mutual trust. 2. Reinforce attempts by clients to be verbally supportive of each other. 3. Obtain an agreement from the partners regarding specific helpful, thoughtful gestures each will perform during the  week . 4. Teach the partners to agree to their mutual satisfaction that a problem has been correctly pinpointed before actually trying to solve  the problem; have them practice this in session on areas of conflict. Objective Commit to moving forward and to letting go of past issues. Target Date: 10/24/2024 Frequency: weekly to bi weekly Progress: 0 Modality: couples Related Interventions 1. Obtain a verbal commitment between the partners and the therapist to attempt to move forward and to put past issues behind them, at least for a specified period. 2. Teach the partners to maintain eye contact with each other while speaking and listening. Objective Increase eye contact and communication with the partner. Target Date: 10/24/2024 Frequency: weekly to bi weekly Progress: 0 Modality: individual Related Interventions 1. Provide feedback to the partners about the extent to which each maintains eye contact with the other and communicates needs.  2. Obtain a commitment from the partners to minimize critical and hostile comments toward each other (or assign Alternatives to Destructive Anger in the Adult Psychotherapy Homework Planner, 2nd ed. by Jenniffer). Objective Identify activities that self and the partner would enjoy together. Target Date: 10/24/2024 Frequency: weekly to bi weekly Progress: 0 Modality: couples 6. Increase time spent together in social and recreational activities. 7. Partners discuss and resolve problems effectively without verbal fighting.  Objective Practice problem-solving techniques within the session. Target Date: 10/24/2024 Frequency: weekly to bi weekly Progress: 0 Modality: couples  Objective Make positive statements to self and others about the future.  Target Date: 10/24/2024 Frequency: weekly to biweekly Progress: 0 Modality: couples Objective Verbalize own level of commitment to remain in the relationship. Target Date: 10/24/2024 Frequency: Weekly to biweekly Progress: 0 Modality: couples   Objective Describe overall level of satisfaction with the relationship. Target Date: 10/24/2024  Frequency: weekly to biweekly Progress: 0 Modality: Couples  Related Interventions 1. Explore each partner's thoughts and feelings concerning the relationship. Diagnosis  F43.23  Adjustment disorder with Mixed anxiety and depression Z63.9 (Relationship distress with spouse or intimate partner) - Open - [Signifier: n/a] Relationship Distress with Spouse or Intimate Partner Conditions For Discharge Achievement of treatment goals and objectives  Kambri Dismore G Krysta Bloomfield, LCSW

## 2023-12-03 ENCOUNTER — Encounter: Payer: Self-pay | Admitting: Radiology

## 2023-12-04 ENCOUNTER — Ambulatory Visit (INDEPENDENT_AMBULATORY_CARE_PROVIDER_SITE_OTHER)

## 2023-12-04 DIAGNOSIS — M6281 Muscle weakness (generalized): Secondary | ICD-10-CM

## 2023-12-04 DIAGNOSIS — M25551 Pain in right hip: Secondary | ICD-10-CM | POA: Diagnosis not present

## 2023-12-04 DIAGNOSIS — R2689 Other abnormalities of gait and mobility: Secondary | ICD-10-CM | POA: Diagnosis not present

## 2023-12-04 DIAGNOSIS — M5459 Other low back pain: Secondary | ICD-10-CM | POA: Diagnosis not present

## 2023-12-04 NOTE — Therapy (Signed)
 OUTPATIENT PHYSICAL THERAPY THORACOLUMBAR EVALUATION   Patient Name: Kendra [name] MRN: [id number] DOB:1970-06-22, 53 y.o., female Today's Date: 12/04/2023  END OF SESSION:  PT End of Session - 12/04/23 0806     Visit Number 1    Number of Visits 16    Date for Recertification  01/29/24    Authorization Type BCBS 30 VL, 30% COINSURANCE, NO AUTH NEEDED    Progress Note Due on Visit 10    PT Start Time 0806    PT Stop Time 0847    PT Time Calculation (min) 41 min    Activity Tolerance Patient tolerated treatment well    Behavior During Therapy WFL for tasks assessed/performed          Past Medical History:  Diagnosis Date   Allergy    Chronic headache    Former smoker    quit in 89   GERD (gastroesophageal reflux disease)    History of kidney stones    Hyperlipidemia    Left bundle branch block    Obesity    Systolic dysfunction, left ventricle    EF is 43% per Myoview ; dyssynchrony of the septum noted on echo; has LBBB   Past Surgical History:  Procedure Laterality Date   CHOLECYSTECTOMY  01/31/2007   CYSTOSCOPY WITH RETROGRADE PYELOGRAM, URETEROSCOPY AND STENT PLACEMENT  03/06/2012   Procedure: CYSTOSCOPY WITH RETROGRADE PYELOGRAM, URETEROSCOPY AND STENT PLACEMENT;  Surgeon: Toribio Neysa Repine, MD;  Location: [facility];  Service: Urology;  Laterality: Left;   CYSTOSCOPY WITH RETROGRADE PYELOGRAM, URETEROSCOPY AND STENT PLACEMENT Left 03/20/2012   Procedure: CYSTOSCOPY WITH RETROGRADE PYELOGRAM, URETEROSCOPY AND STENT PLACEMENT;  Surgeon: Toribio Neysa Repine, MD;  Location: [facility];  Service: Urology;  Laterality: Left;  balloon dilitation   CYSTOSCOPY/URETEROSCOPY/HOLMIUM LASER/STENT PLACEMENT Left 03/08/2018   Procedure: CYSTOSCOPY/RETROGRADE/URETEROSCOPY/STENT PLACEMENT;  Surgeon: Devere Lonni Righter, MD;  Location: [facility];  Service: Urology;  Laterality: Left;   EXTRACORPOREAL SHOCK WAVE LITHOTRIPSY Left 01/24/2018    Procedure: EXTRACORPOREAL SHOCK WAVE LITHOTRIPSY (ESWL);  Surgeon: Alvaro Hummer, MD;  Location: [facility];  Service: Urology;  Laterality: Left;   EXTRACORPOREAL SHOCK WAVE LITHOTRIPSY Right 02/07/2021   Procedure: EXTRACORPOREAL SHOCK WAVE LITHOTRIPSY (ESWL);  Surgeon: Carolee Sherwood JONETTA DOUGLAS, MD;  Location: [facility];  Service: Urology;  Laterality: Right;   FOOT SURGERY  12/30/2005   and 2022 - plantar fascitits   HOLMIUM LASER APPLICATION Left 03/20/2012   Procedure: HOLMIUM LASER APPLICATION;  Surgeon: Toribio Neysa Repine, MD;  Location: [facility];  Service: Urology;  Laterality: Left;   TONSILLECTOMY  03/02/2010   WRIST SURGERY  11/30/2001   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   Anal skin tag 03/11/2019   COVID-19 12/17/2018   Eustachian tube dysfunction 03/10/2015   Acute bronchitis 04/28/2014   Diarrhea 11/18/2013   Right flank pain 11/18/2013   RLQ abdominal pain 11/18/2013   Routine general medical examination at a health care facility 08/06/2013   Palpitations 04/22/2013   Chronic systolic congestive heart failure (HCC) 08/16/2011   Left bundle branch block 05/08/2011   Pleuritic chest pain 04/07/2011   Lower urinary  tract infectious disease 03/15/2011   Ureteral stone 03/15/2011   Screening for HIV (human immunodeficiency virus) 09/16/2010   WEIGHT LOSS-ABNORMAL 01/11/2009   DEPRESSION 10/20/2008   HYPERLIPIDEMIA 06/22/2007   HEADACHE, CHRONIC 06/22/2007   NEPHROLITHIASIS, HX OF 06/22/2007   Hyperlipidemia 06/22/2007   ECZEMA 03/25/2007    PCP: Leita VEAR Blind, MD   REFERRING PROVIDER: Lonell Sprang, DO   REFERRING  DIAG: [phone] (ICD-10-CM) - Chronic right SI joint pain M51.360 (ICD-10-CM) - Degeneration of intervertebral disc of lumbar region with discogenic back pain M54.50 (ICD-10-CM) - Myofascial low back pain  Rationale for Evaluation and Treatment: Rehabilitation  THERAPY DIAG:  Other low back pain  Pain in right hip  Muscle weakness (generalized)  Other abnormalities of gait and mobility  ONSET DATE: 6-8 years of chronic back pain   SUBJECTIVE:                                                                                                                                                                                           SUBJECTIVE STATEMENT: Apparently I have an SI Joint issue.   PERTINENT HISTORY:  Patient endorses chronic back pain, mainly at Rt SIJ/Rt lower back quadrant, that she has received a steroid injection for about 3 weeks ago with no residual relief. Patient endorses no trauma or surgeries to the area but potential small injuries that may have led to throwing her back out (once when going to catch something that fell out of her hands and once with the dog/stairs). Patient reports no other surgeries, trauma, or injuries to the area or surrounding areas, though had a foot surgery in 2007 and wrist surgery in 1998. Patient has been to a chiropractor in the past with no relief. Can only stand for about 20 minutes before required to sit due to pain.  See PMH or personal factors for in depth comorbidities   PAIN:  Are you having pain? Yes: NPRS scale: 0/10 at rest, 10/10 at max prior to steroid injection, 7-8/10 post-injection when it occurs  Pain location: Rt lower quadrant in the back  Pain description: sharp, shooting, aching, dull, throbbing; always a dull ache  Aggravating factors: rolling over, putting leg in pants, turning Rt while standing   Relieving factors: nothing, tried naproxen  but doesn't like taking medication, seat warmers in the car     PRECAUTIONS: None  RED FLAGS: Bowel or bladder incontinence: No and Cauda equina syndrome: No   I can feel my pulse in my back sometimes.  WEIGHT BEARING RESTRICTIONS: No  FALLS:  Has patient fallen in last 6 months? No and caught herself from falling in  May which may have aggravated back pain   LIVING ENVIRONMENT: Lives with: lives with their family Lives in: House/apartment Stairs: Yes: Internal: 4 steps; on right going up Has following equipment at home: None  OCCUPATION: banker, at a desk for most of the day   PLOF: Independent  PATIENT GOALS: to not hurt   NEXT MD VISIT: 12/18/2023 with Dr. Burnetta   OBJECTIVE:  Note: Objective measures were completed at Evaluation unless otherwise noted.  DIAGNOSTIC FINDINGS:  IMPRESSION: 01/21/2023 Degenerative disc disease at T12-L1. No acute bony findings.  PATIENT SURVEYS:  PSFS: THE PATIENT SPECIFIC FUNCTIONAL SCALE  Place score of 0-10 (0 = unable to perform activity and 10 = able to perform activity at the same level as before injury or problem)  Activity Date: 12/04/2023    Standing for prolonged periods   5    2.  Rolling over in bed   2    3. Lifting   6    4.      Total Score 4.33      Total Score = Sum of activity scores/number of activities  Minimally Detectable Change: 3 points (for single activity); 2 points (for average score)  [facility] (nd). The Patient Specific Functional Scale . Retrieved from [email or web address]   COGNITION: Overall cognitive status: Within functional limits for tasks assessed     SENSATION: Light touch: WFL  MUSCLE LENGTH: Not formally assessed, but subjectively tight in both hamstring and hip flexors   POSTURE: rounded shoulders and increased thoracic kyphosis  PALPATION: TTP at Rt piriformis, Rt glutes, Rt SIJ, Rt greater trochanter   LUMBAR ROM:   AROM Eval 12/04/2023  Flexion WFL  Extension Highly  limited due to pain  Right lateral flexion Highly limited due to pain  Left lateral flexion WFL  Right rotation Highly limited due to pain  Left rotation WFL   (Blank rows = not tested)  LOWER EXTREMITY ROM:     Passive  Right Eval 12/04/2023 Left Eval 12/04/2023  Hip flexion (supine) [facility] WFL  Hip extension    Hip abduction    Hip adduction    Hip internal rotation (supine) [facility] WFL  Hip external rotation (supine) [facility] WFL  Knee flexion (supine) [facility] WFL  Knee extension (supine) [facility] WFL  Ankle dorsiflexion    Ankle plantarflexion    Ankle inversion    Ankle eversion     (Blank rows = not tested)  LOWER EXTREMITY MMT:    MMT Right Eval 12/04/2023 Left Eval 12/04/2023  Hip flexion (seated) 4/5 5/5  Hip extension (prone) 3+/5, painful 4/5  Hip abduction (sidelying) 4+/5 4+/5  Hip adduction    Hip internal rotation    Hip external rotation    Knee flexion (seated) 5/5 5/5  Knee extension (seated) 5/5 5/5  Ankle dorsiflexion    Ankle plantarflexion    Ankle inversion    Ankle eversion     (Blank rows = not tested)  LUMBAR SPECIAL TESTS:  FABER test: Negative and FADIR: negative   FUNCTIONAL TESTS:    GAIT: Distance walked: not objectively measured  Assistive device utilized: None Level of assistance: supervision Comments: no overt deviations   TREATMENT DATE:  12/04/2023 TherEx:  HEP handout provided with patient performing one set of each activity for appropriate form. Verbal and tactile cues provided.   Self-Care:  PT discussed POC, dry needling, causes of back/SIJ pain, STM with tennis ball for piriformis/glutes  PATIENT EDUCATION:  Education details: HEP, POC, dry needling, causes of pain Person educated: Patient Education method: Explanation, Demonstration, Tactile cues, Verbal cues, and Handouts Education comprehension:  verbalized understanding, returned demonstration, verbal cues required, and tactile cues required  HOME EXERCISE PROGRAM: Access Code: [email or web address] URL: [email or web address] Date: 12/04/2023 Prepared by: Susannah Daring  Exercises - Seated Piriformis Stretch  - 1 x daily - 7 x weekly - 2 sets - 30s hold - Seated Figure 4 Piriformis Stretch  - 1 x daily - 7 x weekly - 2 sets - 30s hold - Seated Hamstring Stretch  - 1 x daily - 7 x weekly - 2 sets - 30s hold - Hip Flexor Stretch at Edge of Bed  - 1 x daily - 7 x weekly - 2 sets - 30s hold - 90/90 SI Joint Self-Correction  - 1 x daily - 7 x weekly - 2 sets - 10 reps - 5s hold  ASSESSMENT:  CLINICAL IMPRESSION: Patient is a 53 y.o. F who was seen today for physical therapy evaluation and treatment for chronic Rt low back/SIJ pain with functional mobility deficits, motor coordination deficits, postural deficits, general strength deficits, and pain. Patient is mostly limited by pain that is affecting her ability to comfortably perform daily activities. Patient will benefit from skilled PT to address above noted deficits.   OBJECTIVE IMPAIRMENTS: Abnormal gait, decreased activity tolerance, decreased coordination, decreased ROM, decreased strength, increased muscle spasms, impaired flexibility, improper body mechanics, postural dysfunction, obesity, and pain.   ACTIVITY LIMITATIONS: carrying, lifting, bending, sitting, standing, squatting, sleeping, and bed mobility  PARTICIPATION LIMITATIONS: cleaning, laundry, community activity, and occupation  PERSONAL FACTORS: Past/current experiences, Profession, Time since onset of injury/illness/exacerbation, and 3+ comorbidities: asthma, GERD, HLD, HTN, systolic dysfunction of left ventricle, chronic headache, left bundle branch block are also affecting patient's functional outcome.   REHAB POTENTIAL: Good  CLINICAL DECISION MAKING: Evolving/moderate complexity  EVALUATION COMPLEXITY:  Moderate   GOALS: Goals reviewed with patient? Yes  SHORT TERM GOALS: Target date: 01/01/2024  Patient will be compliant with initial HEP. Baseline: Goal status: INITIAL  2.  Patient will report pain levels no greater than 6/10 to show overall improved quality of life. Baseline:  Goal status: INITIAL  3.  Patient will report ability to stand for 30 minutes before requiring a seated rest break due to pain. Baseline:  Goal status: INITIAL    LONG TERM GOALS: Target date: 01/29/2024  Patient will be independent with final HEP in order to maintain and progress upon functional gains made within PT. Baseline:  Goal status: INITIAL  2.  Patient will report pain levels no greater than 4/10 to show overall improved quality of life. Baseline:  Goal status: INITIAL  3.  Patient will increase PSFS to at least 6.33 in order to show a significant improvement in subjective disability rating. Baseline:  Goal status: INITIAL  4.  Patient will increase Rt hip extension to match Lt hip extension in order to improve overall biomechanics with functional mobility. Baseline:  Goal status: INITIAL  5.  Patient will report ability to stand for 45 minutes before requiring a seated rest break secondary to pain. Baseline:  Goal status: INITIAL    PLAN:  PT FREQUENCY: 1-2x/week  PT DURATION: 8 weeks  PLANNED INTERVENTIONS: 97164- PT Re-evaluation, 97750- Physical Performance Testing, 97110-Therapeutic exercises, 97530- Therapeutic activity, [id number]- Neuromuscular re-education, 97535- Self Care, 02859- Manual therapy, [id number]- Gait training, [id number]- Orthotic Initial, [id number]- Orthotic/Prosthetic subsequent, [phone]- Canalith repositioning, [id number]- Aquatic Therapy, H9716-  Electrical stimulation (unattended), [id number]- Electrical stimulation (manual), 02983- Vasopneumatic device, [id number]- Ultrasound, [id number]- Traction (mechanical), 02966- Ionotophoresis 4mg /ml Dexamethasone , 20560 (1-2 muscles), 20561 (3+  muscles)- Dry Needling, Patient/Family education, Balance training, Stair training, Taping, Joint mobilization, Joint manipulation, Spinal manipulation, Spinal mobilization, Vestibular training, DME instructions, Cryotherapy, and Moist heat.  PLAN FOR NEXT SESSION: review HEP, assess leg length, mobs to SIJ, dry needling, generalized LE and postural strengthening, addition of interventions for potential trochanteric bursitis   Susannah Daring, PT, DPT 12/04/23 9:55 AM

## 2023-12-05 ENCOUNTER — Ambulatory Visit: Admitting: Psychology

## 2023-12-05 DIAGNOSIS — Z639 Problem related to primary support group, unspecified: Secondary | ICD-10-CM | POA: Diagnosis not present

## 2023-12-05 DIAGNOSIS — F4323 Adjustment disorder with mixed anxiety and depressed mood: Secondary | ICD-10-CM

## 2023-12-06 NOTE — Progress Notes (Signed)
 [facility] Counselor/Therapist Progress Note  Patient ID: Kendra [name], MRN: [id number],    Date: 12/05/2023  Time Spent: 60 minutes Time in:8:00  Time out:9:00  Treatment Type: Family with patient  Reported Symptoms: sadness, conflict  Mental Status Exam: Appearance:  Casual     Behavior: Appropriate  Motor: Normal  Speech/Language:  Clear and Coherent  Affect: Appropriate  Mood: normal  Thought process: normal  Thought content:   WNL  Sensory/Perceptual disturbances:   WNL  Orientation: oriented to person, place, time/date, and situation  Attention: Good  Concentration: Good  Memory: WNL  Fund of knowledge:  Good  Insight:   Good  Judgment:  Good  Impulse Control: Good   Risk Assessment: Danger to Self:  No Self-injurious Behavior: No Danger to Others: No Duty to Warn:no Physical Aggression / Violence:No  Access to Firearms a concern: No  Gang Involvement:No   Subjective: The patient and her partner came in today for a couples counseling session.  They both were pleasant and cooperative during the session.  The patient's partner did not feel well today so we did some education on communication skills.  I educated them on statements and also reflective listening and asked them to implement these skills during the next week.  They both report that they are doing better since they started therapy.  They are communicating better and more aware of what they are doing as far as interactions.  We still are going to move toward talking about her sons being in the home and how to set limits and navigate situation. Interventions: Cognitive Behavioral Therapy, Assertiveness/Communication, Family Systems, and Interpersonal  Diagnosis:Adjustment disorder with mixed anxiety and depressed mood  Relationship problems  Plan: Plan of Care: Treatment Plan Client Abilities/Strengths Intelligent, strong marriage, good insight and judgment Client Treatment  Preferences Marriage counseling Client Statement of Needs We want to work on helping our relationship be strong Treatment Level Outpatient couples therapy Symptoms  Difficulty resolving problems.: (Status: maintained).  Infrequent communication with partner regarding intimate matters.:  (Status: maintained ) (Status: maintained). Problems Addressed Communication, Communication, Loss of Love/Affection, Depression Due to Relationship Problems  Goals 1. Each partner listens to and understands the other partner's perspective. 2. Each partner notices and verbalizes appreciation to the other for acts of kindness, thoughtfulness, and caring.    Target Date: 10/24/2024 Frequency: weekly to bi weekly Progress: 10 Modality: individual Objective Verbalize realistic, attainable time goals for expression of feelings of affection, and intimacy. Target Date: 10/24/2024 Frequency: weekly to bi weekly Progress: 0 Modality: couples Related Interventions 1. Have the partners provide feedback to each other about specific actions that will help build mutual trust. 2. Reinforce attempts by clients to be verbally supportive of each other. 3. Obtain an agreement from the partners regarding specific helpful, thoughtful gestures each will perform during the week . 4. Teach the partners to agree to their mutual satisfaction that a problem has been correctly pinpointed before actually trying to solve the problem; have them practice this in session on areas of conflict. Objective Commit to moving forward and to letting go of past issues. Target Date: 10/24/2024 Frequency: weekly to bi weekly Progress: 10 Modality: couples Related Interventions 1. Obtain a verbal commitment between the partners and the therapist to attempt to move forward and to put past issues behind them, at least for a specified period. 2. Teach the partners to maintain eye contact with each other while speaking and  listening. Objective Increase eye contact and communication  with the partner. Target Date: 10/24/2024 Frequency: weekly to bi weekly Progress: 0 Modality: individual Related Interventions 1. Provide feedback to the partners about the extent to which each maintains eye contact with the other and communicates needs.  2. Obtain a commitment from the partners to minimize critical and hostile comments toward each other (or assign Alternatives to Destructive Anger in the Adult Psychotherapy Homework Planner, 2nd ed. by Jenniffer). Objective Identify activities that self and the partner would enjoy together. Target Date: 10/24/2024 Frequency: weekly to bi weekly Progress: 0 Modality: couples 6. Increase time spent together in social and recreational activities. 7. Partners discuss and resolve problems effectively without verbal fighting.  Objective Practice problem-solving techniques within the session. Target Date: 10/24/2024 Frequency: weekly to bi weekly Progress: 0 Modality: couples  Objective Make positive statements to self and others about the future.  Target Date: 10/24/2024 Frequency: weekly to biweekly Progress: 0 Modality: couples Objective Verbalize own level of commitment to remain in the relationship. Target Date: 10/24/2024 Frequency: Weekly to biweekly Progress: 0 Modality: couples   Objective Describe overall level of satisfaction with the relationship. Target Date: 10/24/2024 Frequency: weekly to biweekly Progress: 0 Modality: Couples  Related Interventions 1. Explore each partner's thoughts and feelings concerning the relationship. Diagnosis  F43.23  Adjustment disorder with Mixed anxiety and depression Z63.9 (Relationship distress with spouse or intimate partner) - Open - [Signifier: n/a] Relationship Distress with Spouse or Intimate Partner Conditions For Discharge Achievement of treatment goals and objectives  Kendra [name] G Kendra Benway,  LCSW

## 2023-12-11 ENCOUNTER — Ambulatory Visit (INDEPENDENT_AMBULATORY_CARE_PROVIDER_SITE_OTHER): Admitting: Psychology

## 2023-12-11 DIAGNOSIS — F4323 Adjustment disorder with mixed anxiety and depressed mood: Secondary | ICD-10-CM

## 2023-12-11 DIAGNOSIS — Z639 Problem related to primary support group, unspecified: Secondary | ICD-10-CM

## 2023-12-12 NOTE — Progress Notes (Signed)
 [facility] Counselor/Therapist Progress Note  Patient ID: Kendra [name], MRN: [id number],    Date: 12/11/2023  Time Spent: 60 minutes Time in:4:00  Time out:5:00  Treatment Type: Family with patient  Reported Symptoms: sadness, conflict  Mental Status Exam: Appearance:  Casual     Behavior: Appropriate  Motor: Normal  Speech/Language:  Clear and Coherent  Affect: Appropriate  Mood: normal  Thought process: normal  Thought content:   WNL  Sensory/Perceptual disturbances:   WNL  Orientation: oriented to person, place, time/date, and situation  Attention: Good  Concentration: Good  Memory: WNL  Fund of knowledge:  Good  Insight:   Good  Judgment:  Good  Impulse Control: Good   Risk Assessment: Danger to Self:  No Self-injurious Behavior: No Danger to Others: No Duty to Warn:no Physical Aggression / Violence:No  Access to Firearms a concern: No  Gang Involvement:No   Subjective: The patient and her partner came in today for a couples counseling session.  They both were pleasant and cooperative during the session.  The patient and her partner brought up the situation that had happened earlier today and they really were not seeing things out of the eye today.  Some of the issue is that it seems that the patient's partner is somewhat rigid and his belief system and it is difficult for him to adjust his perspective to see how something else should be happening in the circumstance.  The situation that happened was about her asking him what time they were going to leave to go to an appointment earlier today.  He perceived that she should not go that he wanted to be prompted and that he was going to leave at a certain time.  I explained to him that she does not think like he thinks and that he is making the assumption she does.  She also has a tendency to ask a lot of questions which causes him to be defensive.  We talked about those 2 things and I think that we are  going to have to continue to do work on trying to help them understand that neither one wants to be harmful in any way to the other person and that they do not have to be defensive with each other or have attitude.  I will continue to work with them on how to communicate more effectively.  Interventions: Cognitive Behavioral Therapy, Assertiveness/Communication, Family Systems, and Interpersonal  Diagnosis:Adjustment disorder with mixed anxiety and depressed mood  Relationship problems  Plan: Plan of Care: Treatment Plan Client Abilities/Strengths Intelligent, strong marriage, good insight and judgment Client Treatment Preferences Marriage counseling Client Statement of Needs We want to work on helping our relationship be strong Treatment Level Outpatient couples therapy Symptoms  Difficulty resolving problems.: (Status: maintained).  Infrequent communication with partner regarding intimate matters.:  (Status: maintained ) (Status: maintained). Problems Addressed Communication, Communication, Loss of Love/Affection, Depression Due to Relationship Problems  Goals 1. Each partner listens to and understands the other partner's perspective. 2. Each partner notices and verbalizes appreciation to the other for acts of kindness, thoughtfulness, and caring.    Target Date: 10/24/2024 Frequency: weekly to bi weekly Progress: 20 Modality: individual Objective Verbalize realistic, attainable time goals for expression of feelings of affection, and intimacy. Target Date: 10/24/2024 Frequency: weekly to bi weekly Progress: 20 Modality: couples Related Interventions 1. Have the partners provide feedback to each other about specific actions that will help build mutual trust. 2. Reinforce attempts by clients to  be verbally supportive of each other. 3. Obtain an agreement from the partners regarding specific helpful, thoughtful gestures each will perform during the week . 4. Teach the  partners to agree to their mutual satisfaction that a problem has been correctly pinpointed before actually trying to solve the problem; have them practice this in session on areas of conflict. Objective Commit to moving forward and to letting go of past issues. Target Date: 10/24/2024 Frequency: weekly to bi weekly Progress: 20 Modality: couples Related Interventions 1. Obtain a verbal commitment between the partners and the therapist to attempt to move forward and to put past issues behind them, at least for a specified period. 2. Teach the partners to maintain eye contact with each other while speaking and listening. Objective Increase eye contact and communication with the partner. Target Date: 10/24/2024 Frequency: weekly to bi weekly Progress: 10 Modality: individual Related Interventions 1. Provide feedback to the partners about the extent to which each maintains eye contact with the other and communicates needs.  2. Obtain a commitment from the partners to minimize critical and hostile comments toward each other (or assign Alternatives to Destructive Anger in the Adult Psychotherapy Homework Planner, 2nd ed. by Jenniffer). Objective Identify activities that self and the partner would enjoy together. Target Date: 10/24/2024 Frequency: weekly to bi weekly Progress: 0 Modality: couples 6. Increase time spent together in social and recreational activities. 7. Partners discuss and resolve problems effectively without verbal fighting.  Objective Practice problem-solving techniques within the session. Target Date: 10/24/2024 Frequency: weekly to bi weekly Progress: 0 Modality: couples  Objective Make positive statements to self and others about the future.  Target Date: 10/24/2024 Frequency: weekly to biweekly Progress: 0 Modality: couples Objective Verbalize own level of commitment to remain in the relationship. Target Date: 10/24/2024 Frequency: Weekly to  biweekly Progress: 0 Modality: couples   Objective Describe overall level of satisfaction with the relationship. Target Date: 10/24/2024 Frequency: weekly to biweekly Progress: 0 Modality: Couples  Related Interventions 1. Explore each partner's thoughts and feelings concerning the relationship. Diagnosis  F43.23  Adjustment disorder with Mixed anxiety and depression Z63.9 (Relationship distress with spouse or intimate partner) - Open - [Signifier: n/a] Relationship Distress with Spouse or Intimate Partner Conditions For Discharge Achievement of treatment goals and objectives  Aailyah Dunbar G Almira Phetteplace, LCSW

## 2023-12-13 DIAGNOSIS — R030 Elevated blood-pressure reading, without diagnosis of hypertension: Secondary | ICD-10-CM | POA: Diagnosis not present

## 2023-12-14 ENCOUNTER — Ambulatory Visit (INDEPENDENT_AMBULATORY_CARE_PROVIDER_SITE_OTHER): Admitting: Rehabilitative and Restorative Service Providers"

## 2023-12-14 ENCOUNTER — Encounter: Payer: Self-pay | Admitting: Rehabilitative and Restorative Service Providers"

## 2023-12-14 DIAGNOSIS — M5459 Other low back pain: Secondary | ICD-10-CM

## 2023-12-14 DIAGNOSIS — M6281 Muscle weakness (generalized): Secondary | ICD-10-CM

## 2023-12-14 DIAGNOSIS — R2689 Other abnormalities of gait and mobility: Secondary | ICD-10-CM

## 2023-12-14 DIAGNOSIS — M25551 Pain in right hip: Secondary | ICD-10-CM | POA: Diagnosis not present

## 2023-12-14 NOTE — Patient Instructions (Signed)

## 2023-12-14 NOTE — Therapy (Addendum)
 " OUTPATIENT PHYSICAL THERAPY TREATMENT / DISCHARGE   Patient Name: Kendra [name] MRN: [id number] DOB:05-11-1970, 53 y.o., female Today's Date: 12/14/2023  END OF SESSION:  PT End of Session - 12/14/23 0820     Visit Number 2    Number of Visits 16    Date for Recertification  01/29/24    Authorization Type BCBS 30 VL, 30% COINSURANCE, NO AUTH NEEDED    Progress Note Due on Visit 10    PT Start Time 0820    PT Stop Time 0903    PT Time Calculation (min) 43 min    Activity Tolerance Patient tolerated treatment well    Behavior During Therapy WFL for tasks assessed/performed           Past Medical History:  Diagnosis Date   Allergy    Chronic headache    Former smoker    quit in 89   GERD (gastroesophageal reflux disease)    History of kidney stones    Hyperlipidemia    Left bundle branch block    Obesity    Systolic dysfunction, left ventricle    EF is 43% per Myoview ; dyssynchrony of the septum noted on echo; has LBBB   Past Surgical History:  Procedure Laterality Date   CHOLECYSTECTOMY  01/31/2007   CYSTOSCOPY WITH RETROGRADE PYELOGRAM, URETEROSCOPY AND STENT PLACEMENT  03/06/2012   Procedure: CYSTOSCOPY WITH RETROGRADE PYELOGRAM, URETEROSCOPY AND STENT PLACEMENT;  Surgeon: Toribio Neysa Repine, MD;  Location: [facility];  Service: Urology;  Laterality: Left;   CYSTOSCOPY WITH RETROGRADE PYELOGRAM, URETEROSCOPY AND STENT PLACEMENT Left 03/20/2012   Procedure: CYSTOSCOPY WITH RETROGRADE PYELOGRAM, URETEROSCOPY AND STENT PLACEMENT;  Surgeon: Toribio Neysa Repine, MD;  Location[facility];  Service: Urology;  Laterality: Left;  balloon dilitation   CYSTOSCOPY/URETEROSCOPY/HOLMIUM LASER/STENT PLACEMENT Left 03/08/2018   Procedure: CYSTOSCOPY/RETROGRADE/URETEROSCOPY/STENT PLACEMENT;  Surgeon: Devere Lonni Righter, MD;  Location: [facility];  Service: Urology;  Laterality: Left;   EXTRACORPOREAL SHOCK WAVE LITHOTRIPSY Left 01/24/2018    Procedure: EXTRACORPOREAL SHOCK WAVE LITHOTRIPSY (ESWL);  Surgeon: Alvaro Hummer, MD;  Location: [facility];  Service: Urology;  Laterality: Left;   EXTRACORPOREAL SHOCK WAVE LITHOTRIPSY Right 02/07/2021   Procedure: EXTRACORPOREAL SHOCK WAVE LITHOTRIPSY (ESWL);  Surgeon: Carolee Sherwood JONETTA DOUGLAS, MD;  Location: [facility];  Service: Urology;  Laterality: Right;   FOOT SURGERY  12/30/2005   and 2022 - plantar fascitits   HOLMIUM LASER APPLICATION Left 03/20/2012   Procedure: HOLMIUM LASER APPLICATION;  Surgeon: Toribio Neysa Repine, MD;  Location: [facility];  Service: Urology;  Laterality: Left;   TONSILLECTOMY  03/02/2010   WRIST SURGERY  11/30/2001   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   Anal skin tag 03/11/2019   COVID-19 12/17/2018   Eustachian tube dysfunction 03/10/2015   Acute bronchitis 04/28/2014   Diarrhea 11/18/2013   Right flank pain 11/18/2013   RLQ abdominal pain 11/18/2013   Routine general medical examination at a health care facility 08/06/2013   Palpitations 04/22/2013   Chronic systolic congestive heart failure (HCC) 08/16/2011   Left bundle branch block 05/08/2011   Pleuritic chest pain 04/07/2011  Lower urinary tract infectious disease 03/15/2011   Ureteral stone 03/15/2011   Screening for HIV (human immunodeficiency virus) 09/16/2010   WEIGHT LOSS-ABNORMAL 01/11/2009   DEPRESSION 10/20/2008   HYPERLIPIDEMIA 06/22/2007   HEADACHE, CHRONIC 06/22/2007   NEPHROLITHIASIS, HX OF 06/22/2007   Hyperlipidemia 06/22/2007   ECZEMA 03/25/2007    PCP: Leita VEAR Blind, MD   REFERRING PROVIDER: Lonell Sprang, DO   REFERRING  DIAG: [phone] (ICD-10-CM) - Chronic right SI joint pain M51.360 (ICD-10-CM) - Degeneration of intervertebral disc of lumbar region with discogenic back pain M54.50 (ICD-10-CM) - Myofascial low back pain  Rationale for Evaluation and Treatment: Rehabilitation  THERAPY DIAG:  Other low back pain  Pain in right hip  Muscle weakness (generalized)  Other abnormalities of gait and mobility  ONSET DATE: 6-8 years of chronic back pain   SUBJECTIVE:                                                                                                                                                                                           SUBJECTIVE STATEMENT: Pt indicated feeling about the same overall.  Reported no pain increase in HEP but not long lasting improvement.  Pt indicated desire to discuss dry needling.      PERTINENT HISTORY:  Patient endorses chronic back pain, mainly at Rt SIJ/Rt lower back quadrant, that she has received a steroid injection for about 3 weeks ago with no residual relief. Patient endorses no trauma or surgeries to the area but potential small injuries that may have led to throwing her back out (once when going to catch something that fell out of her hands and once with the dog/stairs). Patient reports no other surgeries, trauma, or injuries to the area or surrounding areas, though had a foot surgery in 2007 and wrist surgery in 1998. Patient has been to a chiropractor in the past with no relief. Can only stand for about 20 minutes before required to sit due to pain.  See PMH or personal factors for in depth comorbidities   PAIN:  NPRS scale    :upon arrival  : 5/10, up to 7-8/10 at night.  Pain location: Rt lower quadrant in the back  Pain description: sharp, shooting, aching, dull, throbbing; always a dull ache  Aggravating factors: rolling over, putting leg in pants, turning Rt while standing   Relieving factors: nothing, tried naproxen  but doesn't like  taking medication, seat warmers in the car    PRECAUTIONS: None  RED FLAGS: Bowel or bladder incontinence: No and Cauda equina syndrome: No   I can feel my pulse in my back sometimes.  WEIGHT BEARING RESTRICTIONS:  No  FALLS:  Has patient fallen in last 6 months? No and caught herself from falling in May which may have aggravated back pain   LIVING ENVIRONMENT: Lives with: lives with their family Lives in: House/apartment Stairs: Yes: Internal: 4 steps; on right going up Has following equipment at home: None  OCCUPATION: banker, at a desk for most of the day   PLOF: Independent  PATIENT GOALS: to not hurt   NEXT MD VISIT: 12/18/2023 with Dr. Burnetta   OBJECTIVE:  Note: Objective measures were completed at Evaluation unless otherwise noted.  DIAGNOSTIC FINDINGS:  IMPRESSION: 01/21/2023 Degenerative disc disease at T12-L1. No acute bony findings.  PATIENT SURVEYS:  PSFS: THE PATIENT SPECIFIC FUNCTIONAL SCALE  Place score of 0-10 (0 = unable to perform activity and 10 = able to perform activity at the same level as before injury or problem)  Activity Date: 12/04/2023    Standing for prolonged periods   5    2.  Rolling over in bed   2    3. Lifting   6    4.      Total Score 4.33      Total Score = Sum of activity scores/number of activities  Minimally Detectable Change: 3 points (for single activity); 2 points (for average score)  [facility] (nd). The Patient Specific Functional Scale . Retrieved from [email or web address]   COGNITION: 12/04/2023 Overall cognitive status: Within functional limits for tasks assessed     SENSATION: 12/04/2023 Light touch: WFL  MUSCLE LENGTH: 12/04/2023 Not formally assessed, but subjectively tight in both hamstring and hip flexors   POSTURE: 12/04/2023  rounded shoulders and increased thoracic kyphosis  PALPATION: 12/04/2023 TTP at Rt piriformis, Rt glutes, Rt  SIJ, Rt greater trochanter   LUMBAR ROM:   AROM Eval 12/04/2023  12/14/2023  Flexion WFL   Extension Highly limited due to pain 50% WFL with end range Rt lumbar  Right lateral flexion Highly limited due to pain   Left lateral flexion WFL   Right rotation Highly limited due to pain   Left rotation WFL    (Blank rows = not tested)  LOWER EXTREMITY ROM:     Passive  Right Eval 12/04/2023 Left Eval 12/04/2023  Hip flexion (supine) [facility] WFL  Hip extension    Hip abduction    Hip adduction    Hip internal rotation (supine) [facility] WFL  Hip external rotation (supine) [facility] WFL  Knee flexion (supine) [facility] WFL  Knee extension (supine) [facility] WFL  Ankle dorsiflexion    Ankle plantarflexion    Ankle inversion    Ankle eversion     (Blank rows = not tested)  LOWER EXTREMITY MMT:    MMT Right Eval 12/04/2023 Left Eval 12/04/2023  Hip flexion (seated) 4/5 5/5  Hip extension (prone) 3+/5, painful 4/5  Hip abduction (sidelying) 4+/5 4+/5  Hip adduction    Hip internal rotation    Hip external rotation    Knee flexion (seated) 5/5 5/5  Knee extension (seated) 5/5 5/5  Ankle dorsiflexion    Ankle plantarflexion    Ankle inversion    Ankle eversion     (Blank rows = not tested)  LUMBAR SPECIAL TESTS:  12/04/2023 FABER test: Negative and FADIR: negative   FUNCTIONAL TESTS:  12/04/2023 No testing  GAIT: 12/04/2023 Distance walked: not objectively measured  Assistive device utilized: None Level of assistance: supervision Comments: no overt deviations  TREATMENT       DATE: 12/14/2023 Therex: Supine hooklying lumbar trunk rotation 15 sec x 3 bilateral Supine hooklying figure 4 pull towards stretch 30 sec x 3 bilateral Supine hooklying bridge 2-3 sec hold 2 x 10  Standing lumbar extension AROM x 5.   HEP review and cues on use and techniques.    Self Care Education verbally on post manual and needling soreness possibility and effective strategy to help address  and improve following visit.  Strategies included but not limited to:  heat/ice prn, increased water intake, use of HEP and general mobility to move muscle soreness out.  Pt voiced understanding.   Trigger Point Dry Needling  Initial Treatment: Pt instructed on Dry Needling rational, procedures, and possible side effects. Pt instructed to expect mild to moderate muscle soreness later in the day and/or into the next day.  Pt instructed in methods to reduce muscle soreness. Pt instructed to continue prescribed HEP. Patient was educated on signs and symptoms of infection and other risk factors and advised to seek medical attention should they occur.  Patient verbalized understanding of these instructions and education.   Patient Verbal Consent Given: Yes Education Handout Provided: Yes Muscles Treated: Rt QL, Rt lumbar paraspinals inferiorly  Treatment Response/Outcome: local twitch response    TREATMENT       DATE: 12/04/2023 TherEx:  HEP handout provided with patient performing one set of each activity for appropriate form. Verbal and tactile cues provided.   Self-Care:  PT discussed POC, dry needling, causes of back/SIJ pain, STM with tennis ball for piriformis/glutes                                                                                                                               PATIENT EDUCATION:  Education details: HEP, POC, dry needling, causes of pain Person educated: Patient Education method: Explanation, Demonstration, Tactile cues, Verbal cues, and Handouts Education comprehension: verbalized understanding, returned demonstration, verbal cues required, and tactile cues required  HOME EXERCISE PROGRAM: Access Code: [email or web address] URL: [email or web address] Date: 12/14/2023 Prepared by: Ozell Silvan  Exercises - Seated Piriformis Stretch  - 1 x daily - 7 x weekly - 2 sets - 30s hold - Seated Figure 4 Piriformis Stretch  - 1 x daily - 7 x weekly - 2  sets - 30s hold - Seated Hamstring Stretch  - 1 x daily - 7 x weekly - 2 sets - 30s hold - Hip Flexor Stretch at Edge of Bed  - 1 x daily - 7 x weekly - 2 sets - 30s hold - 90/90 SI Joint Self-Correction  - 1 x daily - 7 x weekly - 2 sets - 10 reps - 5s hold - Supine Bridge  - 1-2 x daily - 7 x weekly - 1-2 sets - 10 reps - 2 hold - Supine Lower Trunk Rotation  - 2-3 x daily - 7 x weekly - 1 sets - 3-5 reps -  15 hold - Standing Lumbar Extension  - 2-4 x daily - 7 x weekly - 1 sets - 5-10 reps  ASSESSMENT:  CLINICAL IMPRESSION: Localized concordant symptoms in lower lumbar from dry needing intervention.  Able to progress mobility in lumbar with reduction of symptoms post treatment today.  Additional lumbar mobility in HEP added with specific cues to adapt based off any symptoms response.  Reviewed self care for post dry needling response. Continued skilled PT services indicated.     OBJECTIVE IMPAIRMENTS: Abnormal gait, decreased activity tolerance, decreased coordination, decreased ROM, decreased strength, increased muscle spasms, impaired flexibility, improper body mechanics, postural dysfunction, obesity, and pain.   ACTIVITY LIMITATIONS: carrying, lifting, bending, sitting, standing, squatting, sleeping, and bed mobility  PARTICIPATION LIMITATIONS: cleaning, laundry, community activity, and occupation  PERSONAL FACTORS: Past/current experiences, Profession, Time since onset of injury/illness/exacerbation, and 3+ comorbidities: asthma, GERD, HLD, HTN, systolic dysfunction of left ventricle, chronic headache, left bundle branch block are also affecting patient's functional outcome.   REHAB POTENTIAL: Good  CLINICAL DECISION MAKING: Evolving/moderate complexity  EVALUATION COMPLEXITY: Moderate   GOALS: Goals reviewed with patient? Yes  SHORT TERM GOALS: Target date: 01/01/2024  Patient will be compliant with initial HEP. Baseline: Goal status: on going 12/14/2023  2.  Patient  will report pain levels no greater than 6/10 to show overall improved quality of life. Baseline:  Goal status: on going 12/14/2023  3.  Patient will report ability to stand for 30 minutes before requiring a seated rest break due to pain. Baseline:  Goal status: on going 12/14/2023    LONG TERM GOALS: Target date: 01/29/2024  Patient will be independent with final HEP in order to maintain and progress upon functional gains made within PT. Baseline:  Goal status: INITIAL  2.  Patient will report pain levels no greater than 4/10 to show overall improved quality of life. Baseline:  Goal status: INITIAL  3.  Patient will increase PSFS to at least 6.33 in order to show a significant improvement in subjective disability rating. Baseline:  Goal status: INITIAL  4.  Patient will increase Rt hip extension to match Lt hip extension in order to improve overall biomechanics with functional mobility. Baseline:  Goal status: INITIAL  5.  Patient will report ability to stand for 45 minutes before requiring a seated rest break secondary to pain. Baseline:  Goal status: INITIAL    PLAN:  PT FREQUENCY: 1-2x/week  PT DURATION: 8 weeks  PLANNED INTERVENTIONS: 97164- PT Re-evaluation, 97750- Physical Performance Testing, 97110-Therapeutic exercises, 97530- Therapeutic activity, [id number]- Neuromuscular re-education, 97535- Self Care, 02859- Manual therapy, [id number]- Gait training, [phone]- Orthotic Initial, [phone]- Orthotic/Prosthetic subsequent, [phone]- Canalith repositioning, [phone]- Aquatic Therapy, [phone]- Electrical stimulation (unattended), [phone]- Electrical stimulation (manual), [id number]- Vasopneumatic device, [id number]- Ultrasound, [id number]- Traction (mechanical), [id number]- Ionotophoresis 4mg /ml Dexamethasone , 79439 (1-2 muscles), 20561 (3+ muscles)- Dry Needling, Patient/Family education, Balance training, Stair training, Taping, Joint mobilization, Joint manipulation, Spinal manipulation, Spinal mobilization,  Vestibular training, DME instructions, Cryotherapy, and Moist heat.  PLAN FOR NEXT SESSION: Dry needling lumbar follow up.    Susannah Daring, PT, DPT 12/14/23 9:14 AM     PHYSICAL THERAPY DISCHARGE SUMMARY  Visits from Start of Care: 2  Current functional level related to goals / functional outcomes: See note   Remaining deficits: See note   Education / Equipment: HEP  Patient goals were partially met. Patient is being discharged due to not returning since the last visit.  SABRAmw   "

## 2023-12-17 ENCOUNTER — Ambulatory Visit: Admitting: Sports Medicine

## 2023-12-18 ENCOUNTER — Ambulatory Visit: Admitting: Sports Medicine

## 2023-12-18 ENCOUNTER — Encounter: Admitting: Physical Therapy

## 2023-12-20 ENCOUNTER — Ambulatory Visit: Admitting: Psychology

## 2023-12-20 DIAGNOSIS — Z639 Problem related to primary support group, unspecified: Secondary | ICD-10-CM | POA: Diagnosis not present

## 2023-12-20 DIAGNOSIS — F4323 Adjustment disorder with mixed anxiety and depressed mood: Secondary | ICD-10-CM

## 2023-12-21 NOTE — Progress Notes (Signed)
  Behavioral Health Counselor/Therapist Progress Note  Patient ID: Kendra [name], MRN: [id number],    Date: 12/20/2023  Time Spent: 60 minutes Time in:8:00  Time out:9:00  Treatment Type: Family with patient  Reported Symptoms: sadness, conflict  Mental Status Exam: Appearance:  Casual     Behavior: Appropriate  Motor: Normal  Speech/Language:  Clear and Coherent  Affect: Appropriate  Mood: normal  Thought process: normal  Thought content:   WNL  Sensory/Perceptual disturbances:   WNL  Orientation: oriented to person, place, time/date, and situation  Attention: Good  Concentration: Good  Memory: WNL  Fund of knowledge:  Good  Insight:   Good  Judgment:  Good  Impulse Control: Good   Risk Assessment: Danger to Self:  No Self-injurious Behavior: No Danger to Others: No Duty to Warn:no Physical Aggression / Violence:No  Access to Firearms a concern: No  Gang Involvement:No   Subjective: The patient and her partner came in today for a couples counseling session.  They both were pleasant and cooperative during the session.  Today the topic of conversation was about the patient's sons living in the home and how her partner feels about things and also about the limits that  probably need to be set as he feels like he is being taken advantage of.  We talked about what he agreement is right now and it seems that she has made the statement that they can have 1 to 2 years to figure out how to get themselves to a different place so that they can move out.  I recommended that they consider 6 months to a year from now and that we give Rankin 2 years being there and low then more time to get himself together.  We talked about them also needing to probably pay rent as they are currently not paying for anything and they are being encouraged to save money but they do not seem to be doing that right now.  We talked about the need for rent to be more to help them get into a better  rhythm of being able to pay for something.  She was saying if we took the rent that they would save it and give it back to them but I feel like that may be encouraging entitlement if they do that.  I think it would be beneficial for them to set a limit of say $300 a month and have the expectation that they are going to pay that on a regular basis.  We talked about her thinking with her mom brain and that it is going to be difficult for her to set these limits because she has not done it so far.  Their homework is to talk about it and figure out what they do want to set and they need to come to some agreement between the 2 of them.  In some ways I was a little bit strong during the session so that they could negotiate and hopefully he would be a little softer and they could come up with their own plan.  I do believe that the patient's partner has been harboring feelings about this for quite some time and that this will help them feel better and be able to get to a place of talking more about things between themselves.  I think he has been holding it in and not saying anything for fear that he would alienate her and intern has just let it build up to the point of  being really frustrated about it. Interventions: Cognitive Behavioral Therapy, Assertiveness/Communication, Family Systems, and Interpersonal  Diagnosis:Adjustment disorder with mixed anxiety and depressed mood  Relationship problems  Plan: Plan of Care: Treatment Plan Client Abilities/Strengths Intelligent, strong marriage, good insight and judgment Client Treatment Preferences Marriage counseling Client Statement of Needs We want to work on helping our relationship be strong Treatment Level Outpatient couples therapy Symptoms  Difficulty resolving problems.: (Status: maintained).  Infrequent communication with partner regarding intimate matters.:  (Status: maintained ) (Status: maintained). Problems Addressed Communication,  Communication, Loss of Love/Affection, Depression Due to Relationship Problems  Goals 1. Each partner listens to and understands the other partner's perspective. 2. Each partner notices and verbalizes appreciation to the other for acts of kindness, thoughtfulness, and caring.    Target Date: 10/24/2024 Frequency: weekly to bi weekly Progress: 20 Modality: individual Objective Verbalize realistic, attainable time goals for expression of feelings of affection, and intimacy. Target Date: 10/24/2024 Frequency: weekly to bi weekly Progress: 20 Modality: couples Related Interventions 1. Have the partners provide feedback to each other about specific actions that will help build mutual trust. 2. Reinforce attempts by clients to be verbally supportive of each other. 3. Obtain an agreement from the partners regarding specific helpful, thoughtful gestures each will perform during the week . 4. Teach the partners to agree to their mutual satisfaction that a problem has been correctly pinpointed before actually trying to solve the problem; have them practice this in session on areas of conflict. Objective Commit to moving forward and to letting go of past issues. Target Date: 10/24/2024 Frequency: weekly to bi weekly Progress: 20 Modality: couples Related Interventions 1. Obtain a verbal commitment between the partners and the therapist to attempt to move forward and to put past issues behind them, at least for a specified period. 2. Teach the partners to maintain eye contact with each other while speaking and listening. Objective Increase eye contact and communication with the partner. Target Date: 10/24/2024 Frequency: weekly to bi weekly Progress: 10 Modality: individual Related Interventions 1. Provide feedback to the partners about the extent to which each maintains eye contact with the other and communicates needs.  2. Obtain a commitment from the partners to minimize critical and  hostile comments toward each other (or assign Alternatives to Destructive Anger in the Adult Psychotherapy Homework Planner, 2nd ed. by Jenniffer). Objective Identify activities that self and the partner would enjoy together. Target Date: 10/24/2024 Frequency: weekly to bi weekly Progress: 0 Modality: couples 6. Increase time spent together in social and recreational activities. 7. Partners discuss and resolve problems effectively without verbal fighting.  Objective Practice problem-solving techniques within the session. Target Date: 10/24/2024 Frequency: weekly to bi weekly Progress: 0 Modality: couples  Objective Make positive statements to self and others about the future.  Target Date: 10/24/2024 Frequency: weekly to biweekly Progress: 0 Modality: couples Objective Verbalize own level of commitment to remain in the relationship. Target Date: 10/24/2024 Frequency: Weekly to biweekly Progress: 0 Modality: couples   Objective Describe overall level of satisfaction with the relationship. Target Date: 10/24/2024 Frequency: weekly to biweekly Progress: 0 Modality: Couples  Related Interventions 1. Explore each partner's thoughts and feelings concerning the relationship. Diagnosis  F43.23  Adjustment disorder with Mixed anxiety and depression Z63.9 (Relationship distress with spouse or intimate partner) - Open - [Signifier: n/a] Relationship Distress with Spouse or Intimate Partner Conditions For Discharge Achievement of treatment goals and objectives  Kimm Sider G Silvia Markuson, LCSW

## 2023-12-25 ENCOUNTER — Encounter: Admitting: Rehabilitative and Restorative Service Providers"

## 2023-12-26 ENCOUNTER — Ambulatory Visit: Admitting: Psychology

## 2023-12-26 DIAGNOSIS — Z639 Problem related to primary support group, unspecified: Secondary | ICD-10-CM

## 2023-12-26 DIAGNOSIS — F4323 Adjustment disorder with mixed anxiety and depressed mood: Secondary | ICD-10-CM | POA: Diagnosis not present

## 2023-12-26 NOTE — Progress Notes (Signed)
 [facility] Counselor/Therapist Progress Note  Patient ID: Kendra [name], MRN: [id number],    Date: 12/26/2023  Time Spent: 60 minutes Time in:2:00  Time out:3:00  Treatment Type: Family with patient  Reported Symptoms: sadness, conflict  Mental Status Exam: Appearance:  Casual     Behavior: Appropriate  Motor: Normal  Speech/Language:  Clear and Coherent  Affect: Appropriate  Mood: normal  Thought process: normal  Thought content:   WNL  Sensory/Perceptual disturbances:   WNL  Orientation: oriented to person, place, time/date, and situation  Attention: Good  Concentration: Good  Memory: WNL  Fund of knowledge:  Good  Insight:   Good  Judgment:  Good  Impulse Control: Good   Risk Assessment: Danger to Self:  No Self-injurious Behavior: No Danger to Others: No Duty to Warn:no Physical Aggression / Violence:No  Access to Firearms a concern: No  Gang Involvement:No   Subjective: The patient and her partner came in today for a couples counseling session.  They both were pleasant and cooperative during the session.  The patient and her partner seems to be doing really well right now.  They said that they feel like the therapy is helping because they are more aware of what they are doing and making more of an effort to be aware.  Today we did an exercise that involved communication skills.  I have some cards that have scenarios on them that we did some talking about and they seem to like that exercise.  The questions were very thought provoking and they were able to come up with examples of how they can use the communication skills that they learned. Interventions: Cognitive Behavioral Therapy, Assertiveness/Communication, Family Systems, and Interpersonal  Diagnosis:Adjustment disorder with mixed anxiety and depressed mood  Relationship problems  Plan: Plan of Care: Treatment Plan Client Abilities/Strengths Intelligent, strong marriage, good insight and  judgment Client Treatment Preferences Marriage counseling Client Statement of Needs We want to work on helping our relationship be strong Treatment Level Outpatient couples therapy Symptoms  Difficulty resolving problems.: (Status: maintained).  Infrequent communication with partner regarding intimate matters.:  (Status: maintained ) (Status: maintained). Problems Addressed Communication, Communication, Loss of Love/Affection, Depression Due to Relationship Problems  Goals 1. Each partner listens to and understands the other partner's perspective. 2. Each partner notices and verbalizes appreciation to the other for acts of kindness, thoughtfulness, and caring.    Target Date: 10/24/2024 Frequency: weekly to bi weekly Progress: 30 Modality: individual Objective Verbalize realistic, attainable time goals for expression of feelings of affection, and intimacy. Target Date: 10/24/2024 Frequency: weekly to bi weekly Progress: 30 Modality: couples Related Interventions 1. Have the partners provide feedback to each other about specific actions that will help build mutual trust. 2. Reinforce attempts by clients to be verbally supportive of each other. 3. Obtain an agreement from the partners regarding specific helpful, thoughtful gestures each will perform during the week . 4. Teach the partners to agree to their mutual satisfaction that a problem has been correctly pinpointed before actually trying to solve the problem; have them practice this in session on areas of conflict. Objective Commit to moving forward and to letting go of past issues. Target Date: 10/24/2024 Frequency: weekly to bi weekly Progress: 30 Modality: couples Related Interventions 1. Obtain a verbal commitment between the partners and the therapist to attempt to move forward and to put past issues behind them, at least for a specified period. 2. Teach the partners to maintain eye contact with each  other while  speaking and listening. Objective Increase eye contact and communication with the partner. Target Date: 10/24/2024 Frequency: weekly to bi weekly Progress: 20 Modality: individual Related Interventions 1. Provide feedback to the partners about the extent to which each maintains eye contact with the other and communicates needs.  2. Obtain a commitment from the partners to minimize critical and hostile comments toward each other (or assign Alternatives to Destructive Anger in the Adult Psychotherapy Homework Planner, 2nd ed. by Jenniffer). Objective Identify activities that self and the partner would enjoy together. Target Date: 10/24/2024 Frequency: weekly to bi weekly Progress: 0 Modality: couples 6. Increase time spent together in social and recreational activities. 7. Partners discuss and resolve problems effectively without verbal fighting.  Objective Practice problem-solving techniques within the session. Target Date: 10/24/2024 Frequency: weekly to bi weekly Progress: 10 Modality: couples  Objective Make positive statements to self and others about the future.  Target Date: 10/24/2024 Frequency: weekly to biweekly Progress: 10 Modality: couples Objective Verbalize own level of commitment to remain in the relationship. Target Date: 10/24/2024 Frequency: Weekly to biweekly Progress: 10 Modality: couples   Objective Describe overall level of satisfaction with the relationship. Target Date: 10/24/2024 Frequency: weekly to biweekly Progress: 10 Modality: Couples  Related Interventions 1. Explore each partner's thoughts and feelings concerning the relationship. Diagnosis  F43.23  Adjustment disorder with Mixed anxiety and depression Z63.9 (Relationship distress with spouse or intimate partner) - Open - [Signifier: n/a] Relationship Distress with Spouse or Intimate Partner Conditions For Discharge Achievement of treatment goals and objectives  Doyne Ellinger G Itzayana Pardy,  LCSW

## 2024-01-01 ENCOUNTER — Encounter: Admitting: Physical Therapy

## 2024-01-02 DIAGNOSIS — L570 Actinic keratosis: Secondary | ICD-10-CM | POA: Diagnosis not present

## 2024-01-02 DIAGNOSIS — L821 Other seborrheic keratosis: Secondary | ICD-10-CM | POA: Diagnosis not present

## 2024-01-02 DIAGNOSIS — Z85828 Personal history of other malignant neoplasm of skin: Secondary | ICD-10-CM | POA: Diagnosis not present

## 2024-01-02 DIAGNOSIS — Z08 Encounter for follow-up examination after completed treatment for malignant neoplasm: Secondary | ICD-10-CM | POA: Diagnosis not present

## 2024-01-02 DIAGNOSIS — L814 Other melanin hyperpigmentation: Secondary | ICD-10-CM | POA: Diagnosis not present

## 2024-01-03 ENCOUNTER — Ambulatory Visit: Admitting: Psychology

## 2024-01-03 DIAGNOSIS — Z639 Problem related to primary support group, unspecified: Secondary | ICD-10-CM

## 2024-01-03 DIAGNOSIS — F4323 Adjustment disorder with mixed anxiety and depressed mood: Secondary | ICD-10-CM

## 2024-01-06 ENCOUNTER — Encounter: Payer: Self-pay | Admitting: Internal Medicine

## 2024-01-06 NOTE — Progress Notes (Signed)
 [facility] Counselor/Therapist Progress Note  Patient ID: Kendra [name], MRN: [id number],    Date: 01/03/2024  Time Spent: 70 minutes Time in:4:10 Time out:5:20  Treatment Type: Family with patient  Reported Symptoms: sadness, conflict  Mental Status Exam: Appearance:  Casual     Behavior: Appropriate  Motor: Normal  Speech/Language:  Clear and Coherent  Affect: Appropriate  Mood: normal  Thought process: normal  Thought content:   WNL  Sensory/Perceptual disturbances:   WNL  Orientation: oriented to person, place, time/date, and situation  Attention: Good  Concentration: Good  Memory: WNL  Fund of knowledge:  Good  Insight:   Good  Judgment:  Good  Impulse Control: Good   Risk Assessment: Danger to Self:  No Self-injurious Behavior: No Danger to Others: No Duty to Warn:no Physical Aggression / Violence:No  Access to Firearms a concern: No  Gang Involvement:No   Subjective: The patient and her partner came in today for a couples counseling session.  They both were pleasant and cooperative during the session.  The patient and her partner continue to do well.  We talked about some of the things they have learned in therapy but they have not really been implementing very many of them.  We talked about the need for them to have a conversation about her sons and the limits that they are going to set around that.  We got into a heated discussion about how to do that.  She and he have been avoiding that conversation and I think the avoidance has been because she does not want to really have a conversation with them and he does not want to be perceived as the bad guy.  I explained that it is important for younger people to have limits and boundaries and that in them having a conversation and having parameters and bullet points about what the significant issues are it would probably help them be able to feel better and help carry his partner not to feel quite as  resentful about some of the things that are going on in the house.  She became tearful during the session and I think it was because she did not know how he felt about it and I explained that it is understandable that he would struggle because he perceives that they should think liking things and that he is providing a good situation for them and that they need to be more respectful of both of them.  I encouraged them to have a conversation between now and her next session.  Interventions: Cognitive Behavioral Therapy, Assertiveness/Communication, Family Systems, and Interpersonal  Diagnosis:Adjustment disorder with mixed anxiety and depressed mood  Relationship problems  Plan: Plan of Care: Treatment Plan Client Abilities/Strengths Intelligent, strong marriage, good insight and judgment Client Treatment Preferences Marriage counseling Client Statement of Needs We want to work on helping our relationship be strong Treatment Level Outpatient couples therapy Symptoms  Difficulty resolving problems.: (Status: maintained).  Infrequent communication with partner regarding intimate matters.:  (Status: maintained ) (Status: maintained). Problems Addressed Communication, Communication, Loss of Love/Affection, Depression Due to Relationship Problems  Goals 1. Each partner listens to and understands the other partner's perspective. 2. Each partner notices and verbalizes appreciation to the other for acts of kindness, thoughtfulness, and caring.    Target Date: 10/24/2024 Frequency: weekly to bi weekly Progress: 30 Modality: individual Objective Verbalize realistic, attainable time goals for expression of feelings of affection, and intimacy. Target Date: 10/24/2024 Frequency: weekly to bi weekly Progress:  30 Modality: couples Related Interventions 1. Have the partners provide feedback to each other about specific actions that will help build mutual trust. 2. Reinforce attempts by  clients to be verbally supportive of each other. 3. Obtain an agreement from the partners regarding specific helpful, thoughtful gestures each will perform during the week . 4. Teach the partners to agree to their mutual satisfaction that a problem has been correctly pinpointed before actually trying to solve the problem; have them practice this in session on areas of conflict. Objective Commit to moving forward and to letting go of past issues. Target Date: 10/24/2024 Frequency: weekly to bi weekly Progress: 30 Modality: couples Related Interventions 1. Obtain a verbal commitment between the partners and the therapist to attempt to move forward and to put past issues behind them, at least for a specified period. 2. Teach the partners to maintain eye contact with each other while speaking and listening. Objective Increase eye contact and communication with the partner. Target Date: 10/24/2024 Frequency: weekly to bi weekly Progress: 20 Modality: individual Related Interventions 1. Provide feedback to the partners about the extent to which each maintains eye contact with the other and communicates needs.  2. Obtain a commitment from the partners to minimize critical and hostile comments toward each other (or assign Alternatives to Destructive Anger in the Adult Psychotherapy Homework Planner, 2nd ed. by Jenniffer). Objective Identify activities that self and the partner would enjoy together. Target Date: 10/24/2024 Frequency: weekly to bi weekly Progress: 0 Modality: couples 6. Increase time spent together in social and recreational activities. 7. Partners discuss and resolve problems effectively without verbal fighting.  Objective Practice problem-solving techniques within the session. Target Date: 10/24/2024 Frequency: weekly to bi weekly Progress: 10 Modality: couples  Objective Make positive statements to self and others about the future.  Target Date: 10/24/2024 Frequency:  weekly to biweekly Progress: 10 Modality: couples Objective Verbalize own level of commitment to remain in the relationship. Target Date: 10/24/2024 Frequency: Weekly to biweekly Progress: 20 Modality: couples   Objective Describe overall level of satisfaction with the relationship. Target Date: 10/24/2024 Frequency: weekly to biweekly Progress: 10 Modality: Couples  Related Interventions 1. Explore each partner's thoughts and feelings concerning the relationship. Diagnosis  F43.23  Adjustment disorder with Mixed anxiety and depression Z63.9 (Relationship distress with spouse or intimate partner) - Open - [Signifier: n/a] Relationship Distress with Spouse or Intimate Partner Conditions For Discharge Achievement of treatment goals and objectives  Kendra [name] Kendra Farren Nelles, LCSW

## 2024-01-07 ENCOUNTER — Other Ambulatory Visit: Payer: Self-pay

## 2024-01-07 MED ORDER — SACUBITRIL-VALSARTAN 97-103 MG PO TABS: 1.0000 | ORAL_TABLET | Freq: Two times a day (BID) | ORAL | 6 refills | Status: AC

## 2024-01-08 ENCOUNTER — Encounter: Admitting: Physical Therapy

## 2024-01-09 DIAGNOSIS — N951 Menopausal and female climacteric states: Secondary | ICD-10-CM | POA: Diagnosis not present

## 2024-01-09 DIAGNOSIS — N952 Postmenopausal atrophic vaginitis: Secondary | ICD-10-CM | POA: Diagnosis not present

## 2024-01-09 DIAGNOSIS — F520 Hypoactive sexual desire disorder: Secondary | ICD-10-CM | POA: Diagnosis not present

## 2024-01-11 ENCOUNTER — Ambulatory Visit: Admitting: Psychology

## 2024-01-11 DIAGNOSIS — Z639 Problem related to primary support group, unspecified: Secondary | ICD-10-CM | POA: Diagnosis not present

## 2024-01-11 DIAGNOSIS — F4323 Adjustment disorder with mixed anxiety and depressed mood: Secondary | ICD-10-CM

## 2024-01-11 NOTE — Progress Notes (Signed)
 [name]  Patient ID: Kendra [name], MRN: [id number],    Date: 01/11/2024  Time Spent: 60 minutes Time in:8:00 Time out:9:00  Treatment Type: Family with patient  Reported Symptoms: sadness, conflict  Mental Status Exam: Appearance:  Casual     Behavior: Appropriate  Motor: Normal  Speech/Language:  Clear and Coherent  Affect: Appropriate  Mood: normal  Thought process: normal  Thought content:   WNL  Sensory/Perceptual disturbances:   WNL  Orientation: oriented to person, place, time/date, and situation  Attention: Good  Concentration: Good  Memory: WNL  Fund of knowledge:  Good  Insight:   Good  Judgment:  Good  Impulse Control: Good   Risk Assessment: Danger to Self:  No Self-injurious Behavior: No Danger to Others: No Duty to Warn:no Physical Aggression / Violence:No  Access to Firearms a concern: No  Gang Involvement:No   Subjective: The patient and her partner came in today for a couples counseling session.  They both were pleasant and cooperative during the session.  The patient and her partner reports that they had a great conversation about setting limits with her sons and they came up with a plan that they could both agree on that was reasonable for working through the conflict that may have about the kids living in the house.  We talked about the plan and I encouraged them to make sure that when they give them the parameters that they stick by those so that they do not change them back to the way they are now.  In addition we talked about him having the engagement ring and not giving it to her and probably his reasons for not doing it and her reasons for feeling like she is not good enough to choose.  We had a conversation and I recommended that the patient's partner speak with his therapist about working through those issues. Interventions: Cognitive Behavioral Therapy, Assertiveness/Communication, Family  Systems, and Interpersonal  Diagnosis:Adjustment disorder with mixed anxiety and depressed mood  Relationship problems  Plan: Plan of Care: Treatment Plan Client Abilities/Strengths Intelligent, strong marriage, good insight and judgment Client Treatment Preferences Marriage counseling Client Statement of Needs We want to work on helping our relationship be strong Treatment Level Outpatient couples therapy Symptoms  Difficulty resolving problems.: (Status: maintained).  Infrequent communication with partner regarding intimate matters.:  (Status: maintained ) (Status: maintained). Problems Addressed Communication, Communication, Loss of Love/Affection, Depression Due to Relationship Problems  Goals 1. Each partner listens to and understands the other partner's perspective. 2. Each partner notices and verbalizes appreciation to the other for acts of kindness, thoughtfulness, and caring.    Target Date: 10/24/2024 Frequency: weekly to bi weekly Progress: 40 Modality: individual Objective Verbalize realistic, attainable time goals for expression of feelings of affection, and intimacy. Target Date: 10/24/2024 Frequency: weekly to bi weekly Progress: 30 Modality: couples Related Interventions 1. Have the partners provide feedback to each other about specific actions that will help build mutual trust. 2. Reinforce attempts by clients to be verbally supportive of each other. 3. Obtain an agreement from the partners regarding specific helpful, thoughtful gestures each will perform during the week . 4. Teach the partners to agree to their mutual satisfaction that a problem has been correctly pinpointed before actually trying to solve the problem; have them practice this in session on areas of conflict. Objective Commit to moving forward and to letting go of past issues. Target Date: 10/24/2024 Frequency: weekly to bi weekly Progress: 40  Modality: couples Related  Interventions 1. Obtain a verbal commitment between the partners and the therapist to attempt to move forward and to put past issues behind them, at least for a specified period. 2. Teach the partners to maintain eye contact with each other while speaking and listening. Objective Increase eye contact and communication with the partner. Target Date: 10/24/2024 Frequency: weekly to bi weekly Progress: 30 Modality: individual Related Interventions 1. Provide feedback to the partners about the extent to which each maintains eye contact with the other and communicates needs.  2. Obtain a commitment from the partners to minimize critical and hostile comments toward each other (or assign Alternatives to Destructive Anger in the Adult Psychotherapy Homework Planner, 2nd ed. by Jenniffer). Objective Identify activities that self and the partner would enjoy together. Target Date: 10/24/2024 Frequency: weekly to bi weekly Progress: 0 Modality: couples 6. Increase time spent together in social and recreational activities. 7. Partners discuss and resolve problems effectively without verbal fighting.  Objective Practice problem-solving techniques within the session. Target Date: 10/24/2024 Frequency: weekly to bi weekly Progress: 20 Modality: couples  Objective Make positive statements to self and others about the future.  Target Date: 10/24/2024 Frequency: weekly to biweekly Progress: 10 Modality: couples Objective Verbalize own level of commitment to remain in the relationship. Target Date: 10/24/2024 Frequency: Weekly to biweekly Progress: 30 Modality: couples   Objective Describe overall level of satisfaction with the relationship. Target Date: 10/24/2024 Frequency: weekly to biweekly Progress: 10 Modality: Couples  Related Interventions 1. Explore each partner's thoughts and feelings concerning the relationship. Diagnosis  F43.23  Adjustment disorder with Mixed anxiety and  depression Z63.9 (Relationship distress with spouse or intimate partner) - Open - [Signifier: n/a] Relationship Distress with Spouse or Intimate Partner Conditions For Discharge Achievement of treatment goals and objectives  Kendra Gallaga G Dawnyel Leven, LCSW

## 2024-01-17 ENCOUNTER — Ambulatory Visit (INDEPENDENT_AMBULATORY_CARE_PROVIDER_SITE_OTHER): Admitting: Psychology

## 2024-01-17 DIAGNOSIS — Z639 Problem related to primary support group, unspecified: Secondary | ICD-10-CM

## 2024-01-17 DIAGNOSIS — F4323 Adjustment disorder with mixed anxiety and depressed mood: Secondary | ICD-10-CM

## 2024-01-17 NOTE — Therapy (Incomplete)
 OUTPATIENT PHYSICAL THERAPY TREATMENT   Patient Name: Kendra [name] MRN: [id number] DOB:Jun 25, 1970, 53 y.o., female Today's Date: 01/17/2024  END OF SESSION:     Past Medical History:  Diagnosis Date   Allergy    Chronic headache    Former smoker    quit in 56   GERD (gastroesophageal reflux disease)    History of kidney stones    Hyperlipidemia    Left bundle branch block    Obesity    Systolic dysfunction, left ventricle    EF is 43% per Myoview ; dyssynchrony of the septum noted on echo; has LBBB   Past Surgical History:  Procedure Laterality Date   CHOLECYSTECTOMY  01/31/2007   CYSTOSCOPY WITH RETROGRADE PYELOGRAM, URETEROSCOPY AND STENT PLACEMENT  03/06/2012   Procedure: CYSTOSCOPY WITH RETROGRADE PYELOGRAM, URETEROSCOPY AND STENT PLACEMENT;  Surgeon: Toribio Neysa Repine, MD;  Location: [facility];  Service: Urology;  Laterality: Left;   CYSTOSCOPY WITH RETROGRADE PYELOGRAM, URETEROSCOPY AND STENT PLACEMENT Left 03/20/2012   Procedure: CYSTOSCOPY WITH RETROGRADE PYELOGRAM, URETEROSCOPY AND STENT PLACEMENT;  Surgeon: Toribio Neysa Repine, MD;  Location: [facility];  Service: Urology;  Laterality: Left;  balloon dilitation   CYSTOSCOPY/URETEROSCOPY/HOLMIUM LASER/STENT PLACEMENT Left 03/08/2018   Procedure: CYSTOSCOPY/RETROGRADE/URETEROSCOPY/STENT PLACEMENT;  Surgeon: Devere Lonni Righter, MD;  Location: [facility];  Service: Urology;  Laterality: Left;   EXTRACORPOREAL SHOCK WAVE LITHOTRIPSY Left 01/24/2018   Procedure: EXTRACORPOREAL SHOCK WAVE LITHOTRIPSY (ESWL);  Surgeon: Alvaro Hummer, MD;  Location: [facility];  Service: Urology;  Laterality: Left;   EXTRACORPOREAL SHOCK WAVE LITHOTRIPSY Right 02/07/2021   Procedure: EXTRACORPOREAL SHOCK WAVE LITHOTRIPSY (ESWL);  Surgeon: Carolee Sherwood JONETTA DOUGLAS, MD;  Location: [facility];  Service: Urology;  Laterality: Right;   FOOT SURGERY  12/30/2005   and 2022 - plantar fascitits    HOLMIUM LASER APPLICATION Left 03/20/2012   Procedure: HOLMIUM LASER APPLICATION;  Surgeon: Toribio Neysa Repine, MD;  Location: [facility];  Service: Urology;  Laterality: Left;   TONSILLECTOMY  03/02/2010   WRIST SURGERY  11/30/2001   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   Anal skin tag 03/11/2019   COVID-19 12/17/2018   Eustachian tube dysfunction 03/10/2015   Acute bronchitis 04/28/2014   Diarrhea 11/18/2013   Right flank pain 11/18/2013   RLQ abdominal pain 11/18/2013   Routine general medical examination at a health care facility 08/06/2013   Palpitations 04/22/2013   Chronic systolic congestive heart failure (HCC) 08/16/2011   Left bundle branch block 05/08/2011   Pleuritic chest pain 04/07/2011   Lower urinary tract infectious disease 03/15/2011   Ureteral stone 03/15/2011   Screening for HIV (human immunodeficiency virus) 09/16/2010   WEIGHT LOSS-ABNORMAL 01/11/2009   DEPRESSION 10/20/2008   HYPERLIPIDEMIA 06/22/2007   HEADACHE, CHRONIC 06/22/2007   NEPHROLITHIASIS, HX OF 06/22/2007   Hyperlipidemia 06/22/2007   ECZEMA 03/25/2007    PCP: Leita VEAR Blind, MD   REFERRING PROVIDER: Lonell Sprang, DO   REFERRING DIAG: [id number] (ICD-10-CM) - Chronic right SI joint pain M51.360 (ICD-10-CM) - Degeneration of intervertebral disc of lumbar region with discogenic back pain M54.50 (ICD-10-CM) - Myofascial low back pain  Rationale  for Evaluation and Treatment: Rehabilitation  THERAPY DIAG:  No diagnosis found.  ONSET DATE: 6-8 years of chronic back pain   SUBJECTIVE:                                                                                                                                                                                            SUBJECTIVE STATEMENT: Pt indicated feeling about the same overall.  Reported no pain increase in HEP but not long lasting improvement.  Pt indicated desire to discuss dry needling.      PERTINENT HISTORY:  Patient endorses chronic back pain, mainly at Rt SIJ/Rt lower back quadrant, that she has received a steroid injection for about 3 weeks ago with no residual relief. Patient endorses no trauma or surgeries to the area but potential small injuries that may have led to throwing her back out (once when going to catch something that fell out of her hands and once with the dog/stairs). Patient reports no other surgeries, trauma, or injuries to the area or surrounding areas, though had a foot surgery in 2007 and wrist surgery in 1998. Patient has been to a chiropractor in the past with no relief. Can only stand for about 20 minutes before required to sit due to pain.  See PMH or personal factors for in depth comorbidities   PAIN:  NPRS scale    :upon arrival  : 5/10, up to 7-8/10 at night.  Pain location: Rt lower quadrant in the back  Pain description: sharp, shooting, aching, dull, throbbing; always a dull ache  Aggravating factors: rolling over, putting leg in pants, turning Rt while standing   Relieving factors: nothing, tried naproxen  but doesn't like taking medication, seat warmers in the car    PRECAUTIONS: None  RED FLAGS: Bowel or bladder incontinence: No and Cauda equina syndrome: No   I can feel my pulse in my back sometimes.  WEIGHT BEARING RESTRICTIONS: No  FALLS:  Has patient fallen in last 6 months? No and caught herself from falling in May which may have aggravated back pain   LIVING ENVIRONMENT: Lives with: lives with their family Lives in: House/apartment Stairs: Yes: Internal: 4 steps; on right going up Has following equipment at home: None  OCCUPATION:  banker, at a desk for most of the day   PLOF: Independent  PATIENT GOALS: to not hurt   NEXT MD VISIT: 12/18/2023 with Dr. Burnetta   OBJECTIVE:  Note: Objective measures were completed at Evaluation unless otherwise noted.  DIAGNOSTIC FINDINGS:  IMPRESSION: 01/21/2023 Degenerative disc disease at T12-L1. No acute bony findings.  PATIENT SURVEYS:  PSFS: THE PATIENT  SPECIFIC FUNCTIONAL SCALE  Place score of 0-10 (0 = unable to perform activity and 10 = able to perform activity at the same level as before injury or problem)  Activity Date: 12/04/2023    Standing for prolonged periods   5    2.  Rolling over in bed   2    3. Lifting   6    4.      Total Score 4.33      Total Score = Sum of activity scores/number of activities  Minimally Detectable Change: 3 points (for single activity); 2 points (for average score)  [facility] (nd). The Patient Specific Functional Scale . Retrieved from [email or web address]   COGNITION: 12/04/2023 Overall cognitive status: Within functional limits for tasks assessed     SENSATION: 12/04/2023 Light touch: WFL  MUSCLE LENGTH: 12/04/2023 Not formally assessed, but subjectively tight in both hamstring and hip flexors   POSTURE: 12/04/2023  rounded shoulders and increased thoracic kyphosis  PALPATION: 12/04/2023 TTP at Rt piriformis, Rt glutes, Rt SIJ, Rt greater trochanter   LUMBAR ROM:   AROM Eval 12/04/2023  12/14/2023  Flexion WFL   Extension Highly limited due to pain 50% WFL with end range Rt lumbar  Right lateral flexion Highly limited due to pain   Left lateral flexion WFL   Right rotation Highly limited due to pain   Left rotation WFL    (Blank rows = not tested)  LOWER EXTREMITY ROM:     Passive  Right Eval 12/04/2023 Left Eval 12/04/2023  Hip flexion (supine) [facility] WFL  Hip extension    Hip abduction    Hip adduction    Hip internal rotation (supine)  [facility] WFL  Hip external rotation (supine) [facility] WFL  Knee flexion (supine) [facility] WFL  Knee extension (supine) [facility] WFL  Ankle dorsiflexion    Ankle plantarflexion    Ankle inversion    Ankle eversion     (Blank rows = not tested)  LOWER EXTREMITY MMT:    MMT Right Eval 12/04/2023 Left Eval 12/04/2023  Hip flexion (seated) 4/5 5/5  Hip extension (prone) 3+/5, painful 4/5  Hip abduction (sidelying) 4+/5 4+/5  Hip adduction    Hip internal rotation    Hip external rotation    Knee flexion (seated) 5/5 5/5  Knee extension (seated) 5/5 5/5  Ankle dorsiflexion    Ankle plantarflexion    Ankle inversion    Ankle eversion     (Blank rows = not tested)  LUMBAR SPECIAL TESTS:  12/04/2023 FABER test: Negative and FADIR: negative   FUNCTIONAL TESTS:  12/04/2023 No testing  GAIT: 12/04/2023 Distance walked: not objectively measured  Assistive device utilized: None Level of assistance: supervision Comments: no overt deviations                 TREATMENT       DATE: 01/17/2024 Therex:     TREATMENT       DATE: 12/14/2023 Therex: Supine hooklying lumbar trunk rotation 15 sec x 3 bilateral Supine hooklying figure 4 pull towards stretch 30 sec x 3 bilateral Supine hooklying bridge 2-3 sec hold 2 x 10  Standing lumbar extension AROM x 5.   HEP review and cues on use and techniques.    Self Care Education verbally on post manual and needling soreness possibility and effective strategy to help address and improve following visit.  Strategies included but not limited to:  heat/ice prn, increased water intake, use of HEP and general mobility  to move muscle soreness out.  Pt voiced understanding.   Trigger Point Dry Needling  Initial Treatment: Pt instructed on Dry Needling rational, procedures, and possible side effects. Pt instructed to expect mild to moderate muscle soreness later in the day and/or into the next day.  Pt instructed in methods to reduce muscle soreness. Pt  instructed to continue prescribed HEP. Patient was educated on signs and symptoms of infection and other risk factors and advised to seek medical attention should they occur.  Patient verbalized understanding of these instructions and education.   Patient Verbal Consent Given: Yes Education Handout Provided: Yes Muscles Treated: Rt QL, Rt lumbar paraspinals inferiorly  Treatment Response/Outcome: local twitch response    TREATMENT       DATE: 12/04/2023 TherEx:  HEP handout provided with patient performing one set of each activity for appropriate form. Verbal and tactile cues provided.   Self-Care:  PT discussed POC, dry needling, causes of back/SIJ pain, STM with tennis ball for piriformis/glutes                                                                                                                               PATIENT EDUCATION:  Education details: HEP, POC, dry needling, causes of pain Person educated: Patient Education method: Explanation, Demonstration, Tactile cues, Verbal cues, and Handouts Education comprehension: verbalized understanding, returned demonstration, verbal cues required, and tactile cues required  HOME EXERCISE PROGRAM: Access Code: [email or web address] URL: [email or web address] Date: 12/14/2023 Prepared by: Ozell Silvan  Exercises - Seated Piriformis Stretch  - 1 x daily - 7 x weekly - 2 sets - 30s hold - Seated Figure 4 Piriformis Stretch  - 1 x daily - 7 x weekly - 2 sets - 30s hold - Seated Hamstring Stretch  - 1 x daily - 7 x weekly - 2 sets - 30s hold - Hip Flexor Stretch at Edge of Bed  - 1 x daily - 7 x weekly - 2 sets - 30s hold - 90/90 SI Joint Self-Correction  - 1 x daily - 7 x weekly - 2 sets - 10 reps - 5s hold - Supine Bridge  - 1-2 x daily - 7 x weekly - 1-2 sets - 10 reps - 2 hold - Supine Lower Trunk Rotation  - 2-3 x daily - 7 x weekly - 1 sets - 3-5 reps - 15 hold - Standing Lumbar Extension  - 2-4 x daily - 7 x weekly - 1  sets - 5-10 reps  ASSESSMENT:  CLINICAL IMPRESSION: Localized concordant symptoms in lower lumbar from dry needing intervention.  Able to progress mobility in lumbar with reduction of symptoms post treatment today.  Additional lumbar mobility in HEP added with specific cues to adapt based off any symptoms response.  Reviewed self care for post dry needling response. Continued skilled PT services indicated.     OBJECTIVE IMPAIRMENTS: Abnormal gait, decreased activity tolerance, decreased coordination, decreased ROM, decreased strength, increased muscle  spasms, impaired flexibility, improper body mechanics, postural dysfunction, obesity, and pain.   ACTIVITY LIMITATIONS: carrying, lifting, bending, sitting, standing, squatting, sleeping, and bed mobility  PARTICIPATION LIMITATIONS: cleaning, laundry, community activity, and occupation  PERSONAL FACTORS: Past/current experiences, Profession, Time since onset of injury/illness/exacerbation, and 3+ comorbidities: asthma, GERD, HLD, HTN, systolic dysfunction of left ventricle, chronic headache, left bundle branch block are also affecting patient's functional outcome.   REHAB POTENTIAL: Good  CLINICAL DECISION MAKING: Evolving/moderate complexity  EVALUATION COMPLEXITY: Moderate   GOALS: Goals reviewed with patient? Yes  SHORT TERM GOALS: Target date: 01/01/2024  Patient will be compliant with initial HEP. Baseline: Goal status: on going 12/14/2023  2.  Patient will report pain levels no greater than 6/10 to show overall improved quality of life. Baseline:  Goal status: on going 12/14/2023  3.  Patient will report ability to stand for 30 minutes before requiring a seated rest break due to pain. Baseline:  Goal status: on going 12/14/2023    LONG TERM GOALS: Target date: 01/29/2024  Patient will be independent with final HEP in order to maintain and progress upon functional gains made within PT. Baseline:  Goal status:  INITIAL  2.  Patient will report pain levels no greater than 4/10 to show overall improved quality of life. Baseline:  Goal status: INITIAL  3.  Patient will increase PSFS to at least 6.33 in order to show a significant improvement in subjective disability rating. Baseline:  Goal status: INITIAL  4.  Patient will increase Rt hip extension to match Lt hip extension in order to improve overall biomechanics with functional mobility. Baseline:  Goal status: INITIAL  5.  Patient will report ability to stand for 45 minutes before requiring a seated rest break secondary to pain. Baseline:  Goal status: INITIAL    PLAN:  PT FREQUENCY: 1-2x/week  PT DURATION: 8 weeks  PLANNED INTERVENTIONS: 97164- PT Re-evaluation, 97750- Physical Performance Testing, 97110-Therapeutic exercises, 97530- Therapeutic activity, [id number]- Neuromuscular re-education, 97535- Self Care, [id number]- Manual therapy, [id number]- Gait training, [phone]- Orthotic Initial, [phone]- Orthotic/Prosthetic subsequent, [phone]- Canalith repositioning, [phone]- Aquatic Therapy, [phone]- Electrical stimulation (unattended), [phone]- Electrical stimulation (manual), [id number]- Vasopneumatic device, [id number]- Ultrasound, [id number]- Traction (mechanical), [id number]- Ionotophoresis 4mg /ml Dexamethasone , 79439 (1-2 muscles), [id number] (3+ muscles)- Dry Needling, Patient/Family education, Balance training, Stair training, Taping, Joint mobilization, Joint manipulation, Spinal manipulation, Spinal mobilization, Vestibular training, DME instructions, Cryotherapy, and Moist heat.  PLAN FOR NEXT SESSION: Dry needling lumbar follow up.    Susannah Daring, PT, DPT 01/17/2024 4:22 PM

## 2024-01-18 ENCOUNTER — Encounter: Admitting: Rehabilitative and Restorative Service Providers"

## 2024-01-18 ENCOUNTER — Telehealth: Payer: Self-pay | Admitting: Rehabilitative and Restorative Service Providers"

## 2024-01-18 NOTE — Telephone Encounter (Signed)
 Called patient after 15 mins no show for appointment today.  Left message and reminder of next appointment time, as well as call back number.   Ozell Silvan, PT, DPT, OCS, ATC 01/18/2024  8:18 AM

## 2024-01-20 NOTE — Progress Notes (Signed)
 " [facility] Counselor/Therapist Progress Note  Patient ID: SELENIA MIHOK, MRN: [id number],    Date: 01/17/2024  Time Spent: 60 minutes Time in:8:00 Time out:9:00  Treatment Type: Family with patient  Reported Symptoms: sadness, conflict  Mental Status Exam: Appearance:  Casual     Behavior: Appropriate  Motor: Normal  Speech/Language:  Clear and Coherent  Affect: Appropriate  Mood: normal  Thought process: normal  Thought content:   WNL  Sensory/Perceptual disturbances:   WNL  Orientation: oriented to person, place, time/date, and situation  Attention: Good  Concentration: Good  Memory: WNL  Fund of knowledge:  Good  Insight:   Good  Judgment:  Good  Impulse Control: Good   Risk Assessment: Danger to Self:  No Self-injurious Behavior: No Danger to Others: No Duty to Warn:no Physical Aggression / Violence:No  Access to Firearms a concern: No  Gang Involvement:No   Subjective: The patient and her partner came in today for a couples counseling session.  They both were pleasant and cooperative during the session.  Today we spent the session on identifying the things about the other partner that they admire and respect.  They were both able to come up with some very good examples of things that the other does for them that they really admire and respect about each other.  They are doing a better job of interacting and communicating with each other and being able to recognize their own issues in relationship and have been able to catch themselves and correct those things. Interventions: Cognitive Behavioral Therapy, Assertiveness/Communication, Family Systems, and Interpersonal  Diagnosis:Adjustment disorder with mixed anxiety and depressed mood  Relationship problems  Plan: Plan of Care: Treatment Plan Client Abilities/Strengths Intelligent, strong marriage, good insight and judgment Client Treatment Preferences Marriage counseling Client Statement of  Needs We want to work on helping our relationship be strong Treatment Level Outpatient couples therapy Symptoms  Difficulty resolving problems.: (Status: maintained).  Infrequent communication with partner regarding intimate matters.:  (Status: maintained ) (Status: maintained). Problems Addressed Communication, Communication, Loss of Love/Affection, Depression Due to Relationship Problems  Goals 1. Each partner listens to and understands the other partner's perspective. 2. Each partner notices and verbalizes appreciation to the other for acts of kindness, thoughtfulness, and caring.    Target Date: 10/24/2024 Frequency: weekly to bi weekly Progress: 50 Modality: individual Objective Verbalize realistic, attainable time goals for expression of feelings of affection, and intimacy. Target Date: 10/24/2024 Frequency: weekly to bi weekly Progress: 40 Modality: couples Related Interventions 1. Have the partners provide feedback to each other about specific actions that will help build mutual trust. 2. Reinforce attempts by clients to be verbally supportive of each other. 3. Obtain an agreement from the partners regarding specific helpful, thoughtful gestures each will perform during the week . 4. Teach the partners to agree to their mutual satisfaction that a problem has been correctly pinpointed before actually trying to solve the problem; have them practice this in session on areas of conflict. Objective Commit to moving forward and to letting go of past issues. Target Date: 10/24/2024 Frequency: weekly to bi weekly Progress: 50 Modality: couples Related Interventions 1. Obtain a verbal commitment between the partners and the therapist to attempt to move forward and to put past issues behind them, at least for a specified period. 2. Teach the partners to maintain eye contact with each other while speaking and listening. Objective Increase eye contact and communication with  the partner. Target Date: 10/24/2024 Frequency: weekly  to bi weekly Progress: 40 Modality: individual Related Interventions 1. Provide feedback to the partners about the extent to which each maintains eye contact with the other and communicates needs.  2. Obtain a commitment from the partners to minimize critical and hostile comments toward each other (or assign Alternatives to Destructive Anger in the Adult Psychotherapy Homework Planner, 2nd ed. by Jenniffer). Objective Identify activities that self and the partner would enjoy together. Target Date: 10/24/2024 Frequency: weekly to bi weekly Progress: 0 Modality: couples 6. Increase time spent together in social and recreational activities. 7. Partners discuss and resolve problems effectively without verbal fighting.  Objective Practice problem-solving techniques within the session. Target Date: 10/24/2024 Frequency: weekly to bi weekly Progress: 30 Modality: couples  Objective Make positive statements to self and others about the future.  Target Date: 10/24/2024 Frequency: weekly to biweekly Progress: 20 Modality: couples Objective Verbalize own level of commitment to remain in the relationship. Target Date: 10/24/2024 Frequency: Weekly to biweekly Progress: 40 Modality: couples   Objective Describe overall level of satisfaction with the relationship. Target Date: 10/24/2024 Frequency: weekly to biweekly Progress: 20 Modality: Couples  Related Interventions 1. Explore each partner's thoughts and feelings concerning the relationship. Diagnosis  F43.23  Adjustment disorder with Mixed anxiety and depression Z63.9 (Relationship distress with spouse or intimate partner) - Open - [Signifier: n/a] Relationship Distress with Spouse or Intimate Partner Conditions For Discharge Achievement of treatment goals and objectives  Solangel Mcmanaway G Khiem Gargis, LCSW                                  "

## 2024-01-22 ENCOUNTER — Encounter: Admitting: Physical Therapy

## 2024-01-25 ENCOUNTER — Ambulatory Visit: Admitting: Psychology

## 2024-01-28 ENCOUNTER — Other Ambulatory Visit: Payer: Self-pay

## 2024-01-29 MED ORDER — CARVEDILOL 12.5 MG PO TABS: 12.5000 mg | ORAL_TABLET | Freq: Two times a day (BID) | ORAL | 1 refills | Status: AC

## 2024-01-29 MED ORDER — ROSUVASTATIN CALCIUM 10 MG PO TABS: 10.0000 mg | ORAL_TABLET | Freq: Every day | ORAL | 1 refills | Status: AC

## 2024-01-29 MED ORDER — SPIRONOLACTONE 25 MG PO TABS: 25.0000 mg | ORAL_TABLET | Freq: Every day | ORAL | 1 refills | Status: AC

## 2024-01-30 ENCOUNTER — Ambulatory Visit: Admitting: Psychology

## 2024-01-30 DIAGNOSIS — Z639 Problem related to primary support group, unspecified: Secondary | ICD-10-CM | POA: Diagnosis not present

## 2024-01-30 DIAGNOSIS — F4323 Adjustment disorder with mixed anxiety and depressed mood: Secondary | ICD-10-CM | POA: Diagnosis not present

## 2024-01-30 NOTE — Progress Notes (Signed)
 " [facility] Counselor/Therapist Progress Note  Patient ID: COREE [name], MRN: [id number],    Date: 01/30/2024  Time Spent: 60 minutes Time in:8:00 Time out:9:00  Treatment Type: Family with patient  Reported Symptoms: sadness, conflict  Mental Status Exam: Appearance:  Casual     Behavior: Appropriate  Motor: Normal  Speech/Language:  Clear and Coherent  Affect: Appropriate  Mood: normal  Thought process: normal  Thought content:   WNL  Sensory/Perceptual disturbances:   WNL  Orientation: oriented to person, place, time/date, and situation  Attention: Good  Concentration: Good  Memory: WNL  Fund of knowledge:  Good  Insight:   Good  Judgment:  Good  Impulse Control: Good   Risk Assessment: Danger to Self:  No Self-injurious Behavior: No Danger to Others: No Duty to Warn:no Physical Aggression / Violence:No  Access to Firearms a concern: No  Gang Involvement:No   Subjective: The patient and her partner came in today for a couples counseling session.  They both were pleasant and cooperative during the session.  The patient and her partner reports that things seem to be going well and they have not been having too many issues with communication as of late.  They did have a good holiday season and her partner talked about his concern for her about not being able to spend much time with her parents.  We processed that and she seems to be reasonable about that and realizes that her mother is depressed and that her mother is making a choice about what she is doing.  In addition we talked about the need for them to have a conversation with their sons about becoming more responsible and paying rent.  I asked them when they were going to have that conversation and they had not discussed it yet but I recommended they have it pretty soon because it is the new year and it we will give them time to be able to adjust and adapt to the changes that they are asking them to  make.  We talked about ways to communicate with them about the situation.  Interventions: Cognitive Behavioral Therapy, Assertiveness/Communication, Family Systems, and Interpersonal  Diagnosis:Adjustment disorder with mixed anxiety and depressed mood  Relationship problems  Plan: Plan of Care: Treatment Plan Client Abilities/Strengths Intelligent, strong marriage, good insight and judgment Client Treatment Preferences Marriage counseling Client Statement of Needs We want to work on helping our relationship be strong Treatment Level Outpatient couples therapy Symptoms  Difficulty resolving problems.: (Status: maintained).  Infrequent communication with partner regarding intimate matters.:  (Status: maintained ) (Status: maintained). Problems Addressed Communication, Communication, Loss of Love/Affection, Depression Due to Relationship Problems  Goals 1. Each partner listens to and understands the other partner's perspective. 2. Each partner notices and verbalizes appreciation to the other for acts of kindness, thoughtfulness, and caring.    Target Date: 10/24/2024 Frequency: weekly to bi weekly Progress: 60 Modality: individual Objective Verbalize realistic, attainable time goals for expression of feelings of affection, and intimacy. Target Date: 10/24/2024 Frequency: weekly to bi weekly Progress: 50 Modality: couples Related Interventions 1. Have the partners provide feedback to each other about specific actions that will help build mutual trust. 2. Reinforce attempts by clients to be verbally supportive of each other. 3. Obtain an agreement from the partners regarding specific helpful, thoughtful gestures each will perform during the week . 4. Teach the partners to agree to their mutual satisfaction that a problem has been correctly pinpointed before actually trying to  solve the problem; have them practice this in session on areas of conflict. Objective Commit to  moving forward and to letting go of past issues. Target Date: 10/24/2024 Frequency: weekly to bi weekly Progress: 60 Modality: couples Related Interventions 1. Obtain a verbal commitment between the partners and the therapist to attempt to move forward and to put past issues behind them, at least for a specified period. 2. Teach the partners to maintain eye contact with each other while speaking and listening. Objective Increase eye contact and communication with the partner. Target Date: 10/24/2024 Frequency: weekly to bi weekly Progress: 50 Modality: individual Related Interventions 1. Provide feedback to the partners about the extent to which each maintains eye contact with the other and communicates needs.  2. Obtain a commitment from the partners to minimize critical and hostile comments toward each other (or assign Alternatives to Destructive Anger in the Adult Psychotherapy Homework Planner, 2nd ed. by Jenniffer). Objective Identify activities that self and the partner would enjoy together. Target Date: 10/24/2024 Frequency: weekly to bi weekly Progress: 0 Modality: couples 6. Increase time spent together in social and recreational activities. 7. Partners discuss and resolve problems effectively without verbal fighting.  Objective Practice problem-solving techniques within the session. Target Date: 10/24/2024 Frequency: weekly to bi weekly Progress: 50 Modality: couples  Objective Make positive statements to self and others about the future.  Target Date: 10/24/2024 Frequency: weekly to biweekly Progress: 50 Modality: couples Objective Verbalize own level of commitment to remain in the relationship. Target Date: 10/24/2024 Frequency: Weekly to biweekly Progress: 40 Modality: couples   Objective Describe overall level of satisfaction with the relationship. Target Date: 10/24/2024 Frequency: weekly to biweekly Progress: 50 Modality: Couples  Related Interventions 1.  Explore each partner's thoughts and feelings concerning the relationship. Diagnosis  F43.23  Adjustment disorder with Mixed anxiety and depression Z63.9 (Relationship distress with spouse or intimate partner) - Open - [Signifier: n/a] Relationship Distress with Spouse or Intimate Partner Conditions For Discharge Achievement of treatment goals and objectives  Jackqueline Aquilar G Avalin Briley, LCSW                                  "

## 2024-02-01 ENCOUNTER — Ambulatory Visit: Admitting: Sports Medicine

## 2024-02-11 ENCOUNTER — Encounter: Payer: Self-pay | Admitting: Sports Medicine

## 2024-02-11 ENCOUNTER — Ambulatory Visit: Admitting: Sports Medicine

## 2024-02-11 DIAGNOSIS — G8929 Other chronic pain: Secondary | ICD-10-CM | POA: Diagnosis not present

## 2024-02-11 DIAGNOSIS — M722 Plantar fascial fibromatosis: Secondary | ICD-10-CM

## 2024-02-11 DIAGNOSIS — M533 Sacrococcygeal disorders, not elsewhere classified: Secondary | ICD-10-CM | POA: Diagnosis not present

## 2024-02-11 DIAGNOSIS — M5136 Other intervertebral disc degeneration, lumbar region with discogenic back pain only: Secondary | ICD-10-CM

## 2024-02-11 DIAGNOSIS — Z8739 Personal history of other diseases of the musculoskeletal system and connective tissue: Secondary | ICD-10-CM

## 2024-02-11 DIAGNOSIS — M25551 Pain in right hip: Secondary | ICD-10-CM

## 2024-02-11 NOTE — Progress Notes (Signed)
 Patient says that her pain has gotten much worse and is across a larger area than it was at her last visit. She says that it is now over a bigger space in the back of the hip and low back, as well as around the lateral hip and into the groin. She is unable to be on her feet for more than 20 minutes without needed to take a break. She primarily has constant dull, throbbing, and aching pain at an 8/10, but will get sharp pains with movement. She says that her pain is effecting her daily life. She has had medial knee pain, as well as heel pain, on the right side and is unsure if these are related to her back. She denies any symptoms in the upper or lower leg.

## 2024-02-11 NOTE — Progress Notes (Addendum)
 "  Kendra [name] - 54 y.o. female MRN [id number]  Date of birth: February 09, 1970  Office Visit Note: Visit Date: 02/11/2024 PCP: Stephane Leita DEL, MD Referred by: Stephane Leita DEL, MD  Subjective: Chief Complaint  Patient presents with   Lower Back - Follow-up   Right Knee - Follow-up   HPI: Kendra [name] is a pleasant 54 y.o. female who presents today for acute on chronic right low back/hip, and R-plantar heel pain.  Discussed the use of AI scribe software for clinical note transcription with the patient, who gave verbal consent to proceed.  History of Present Illness Kendra [name] is a 54 year old female with chronic right sacroiliac joint and low back pain who presents for evaluation of persistent, severe right-sided low back, sacroiliac joint, and hip pain refractory to prior treatments.  Since October 2025 she has had constant burning, throbbing pain in the right sacroiliac region and posterior hip with progressive worsening. Pain extends from the posterior to anterior hip and sometimes into the groin and lower abdomen. Lumbar extension or arching causes sharp flares. Activity such as cleaning or lifting markedly aggravates pain and can require prolonged rest. Lying flat in a recliner gives only partial relief. She is worried that this level of pain is not sustainable long term.  An ultrasound-guided right sacroiliac joint injection in October 2025 gave marked relief for about two weeks, then the pain gradually recurred and has worsened. She started physical therapy after the injection and continues home exercises. Massage and lymphatic techniques have not helped. Trials of naproxen  500 mg, meloxicam , and ibuprofen  have been inadequate. She avoids taking these together, does not want stronger analgesics, and does not currently have meloxicam  available.  She has new right heel pain, different from prior left plantar fasciitis, localized to the heel. She has used orthotics and basic  stretches. This is less concerning to her than her back and hip pain.  She has intermittent right knee discomfort with twisting or turning, preceded by a prior twisting or popping event. Symptoms are not constant and are less significant than her back and hip pain.  Pertinent ROS were reviewed with the patient and found to be negative unless otherwise specified above in HPI.   Assessment & Plan: Visit Diagnoses:  1. Chronic right SI joint pain   2. History of burning pain in leg   3. Degeneration of intervertebral disc of lumbar region with discogenic back pain   4. Plantar fascia syndrome   5. Chronic right hip pain    A/P --> chronic right posterior hip/low back pain with burning and throbbing that wraps around the posterolateral hip and at times into the groin.  Negative provocative exams from hip on exam.  Will need to obtain MRI of the low back and pelvis to fully evaluate origin of, spine from low back with possible radicular symptoms versus SI joint specifically. Assessment & Plan Chronic right sacroiliac joint pain Chronic severe right sacroiliac joint pain refractory to prior injection and conservative therapies, impairing daily function. Previous injection confirmed sacroiliac joint involvement given improvement short-term for few weeks. - Ordered MRI of pelvis and hips to evaluate sacroiliac joint and surrounding structures. - Advised continuation of home physical therapy exercises 2-3 times per week as tolerated. - Recommended naproxen  or ibuprofen  as needed for analgesia, avoiding concurrent use of multiple NSAIDs. - Discussed short-term prednisone  dose pack for acute exacerbations; deferred due to history of fluid retention. Advised notification if pain worsens prior to  travel for alternative dosing. - Scheduled follow-up one week after MRI to review imaging and adjust management.  Lumbar intervertebral disc degeneration with discogenic pain Chronic lumbar discogenic pain with  burning and throbbing, exacerbated by extension and activity, impairing function and mobility. Possible disc bulge or nerve compression considered. - Ordered MRI of lumbar spine to assess for disc degeneration, disc bulge, or nerve compression. - Advised continuation of home physical therapy exercises for maintenance. - Recommended naproxen  or ibuprofen  as needed for pain control. - Scheduled follow-up one week after MRI to review results and discuss further management.  Right plantar heel pain New right plantar heel pain, distinct from prior left-sided plantar fasciitis, localized to the heel, possibly involving the plantar fascia. - Advised continuation of plantar fascia stretching exercises. - Recommended use of orthotics for support. - Recommended stretching and PT exercises she had done previously for the contralateral side   *Additional considerations: Low likelihood but LE-EMG/NCS, Referral to Dr. Georgina  Follow-up: Return for F/u 1-week after MRI's to review and discuss next steps.   Meds & Orders: No orders of the defined types were placed in this encounter.  No orders of the defined types were placed in this encounter.    Procedures: No procedures performed      Clinical History: No specialty comments available.  She reports that she quit smoking about 33 years ago. Her smoking use included cigarettes. She has never used smokeless tobacco. No results for input(s): HGBA1C, LABURIC in the last 8760 hours.  Objective:    Physical Exam  Gen: Well-appearing, in no acute distress; non-toxic CV: Well-perfused. Warm.  Resp: Breathing unlabored on room air; no wheezing. Psych: Fluid speech in conversation; appropriate affect; normal thought process  *MSK/Ortho Exam: Physical Exam MUSCULOSKELETAL: Tenderness at L4, L5, and SI joint on palpation. Pain with lumbar spine extension. No pain with lumbar spine flexion. No pain with hip flexion or rotation. Tightness with hip  abduction. Leg strength 5/5 bilaterally with no weakness.  Lumbar: There is midline spinous tenderness here and on the right side of the low back near the L4-L5 juncture.  There is pain and limitation with extension, positive pain with axial loading.  There is relief of pain with forward flexion.  Equivocal straight leg raise on the right./5 strength of bilateral lower extremity myotomes.  Right hip/SI-joint: Fluid logroll without pain or restriction in range of motion internal or externally.  There is equivocal leg raise.  Positive Fortin's point test, rather notable TTP just inferior to the PSIS.  Positive Gaenslen's test.  Imaging: No results found.  *Independent review and interpretation of complete lumbar x-ray from 01/21/2023 as well as CT renal scan from 07/03/2023 with attention to the musculoskeletal component was performed by myself today.   CT Renal Stone Study CLINICAL DATA:  Right-sided flank pain.   EXAM: CT ABDOMEN AND PELVIS WITHOUT CONTRAST   TECHNIQUE: Multidetector CT imaging of the abdomen and pelvis was performed following the standard protocol without IV contrast.   RADIATION DOSE REDUCTION: This exam was performed according to the departmental dose-optimization program which includes automated exposure control, adjustment of the mA and/or kV according to patient size and/or use of iterative reconstruction technique.   COMPARISON:  01/14/2018.   FINDINGS: Lower chest: No acute abnormality. No pericardial or pleural effusions.   Hepatobiliary: No focal liver abnormality is seen. Status post cholecystectomy. No biliary dilatation.   Pancreas: Unremarkable. No pancreatic ductal dilatation or surrounding inflammatory changes.   Spleen: Normal in size  without focal abnormality.   Adrenals/Urinary Tract: Adrenal glands are unremarkable. Kidneys are normal, without renal calculi, focal lesion, or hydronephrosis. Bladder is unremarkable.   Stomach/Bowel:  Stomach is within normal limits. Appendix appears normal. No evidence of bowel wall thickening, distention, or inflammatory changes.   Vascular/Lymphatic: No significant vascular findings are present. No enlarged abdominal or pelvic lymph nodes.   Reproductive: Uterus and bilateral adnexa are unremarkable. There is an IUD.   Other: No abdominal wall hernia or abnormality. No abdominopelvic ascites.   Musculoskeletal: No acute or significant osseous findings.   IMPRESSION: No acute abdominal or pelvic pathology identified.   Electronically Signed   By: Fonda Field M.D.   On: 07/03/2023 22:39   Narrative & Impression  CLINICAL DATA:  Pain.   EXAM: LUMBAR SPINE - COMPLETE 4+ VIEW   COMPARISON:  None Available.   FINDINGS: No evidence for an acute fracture or subluxation. Loss of disc height with endplate spurring noted T12-L1. Facets are well aligned bilaterally. SI joints unremarkable.   IMPRESSION: Degenerative disc disease at T12-L1. No acute bony findings.     Electronically Signed   By: Camellia Candle M.D.   On: 01/21/2023 08:57    Narrative & Impression  CLINICAL DATA:  Pain.   EXAM: LUMBAR SPINE - COMPLETE 4+ VIEW   COMPARISON:  None Available.   FINDINGS: No evidence for an acute fracture or subluxation. Loss of disc height with endplate spurring noted T12-L1. Facets are well aligned bilaterally. SI joints unremarkable.   IMPRESSION: Degenerative disc disease at T12-L1. No acute bony findings.     Electronically Signed   By: Camellia Candle M.D.   On: 01/21/2023 08:57    Past Medical/Family/Surgical/Social History: Medications & Allergies reviewed per EMR, new medications updated. 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   Anal skin tag 03/11/2019   COVID-19 12/17/2018   Eustachian tube dysfunction 03/10/2015   Acute bronchitis 04/28/2014   Diarrhea 11/18/2013   Right flank pain 11/18/2013   RLQ abdominal pain 11/18/2013   Routine general medical examination at a health care facility 08/06/2013   Palpitations 04/22/2013   Chronic systolic congestive heart failure (HCC) 08/16/2011   Left bundle branch block 05/08/2011   Pleuritic chest pain 04/07/2011   Lower urinary tract infectious disease 03/15/2011   Ureteral stone 03/15/2011   Screening for HIV (human immunodeficiency virus) 09/16/2010   WEIGHT LOSS-ABNORMAL 01/11/2009   DEPRESSION 10/20/2008   HYPERLIPIDEMIA 06/22/2007   HEADACHE, CHRONIC 06/22/2007   NEPHROLITHIASIS, HX OF 06/22/2007   Hyperlipidemia 06/22/2007   ECZEMA 03/25/2007   Past Medical History:  Diagnosis Date   Allergy    Chronic headache    Former smoker    quit in 50   GERD (gastroesophageal reflux disease)    History of kidney stones    Hyperlipidemia    Left bundle branch block    Obesity    Systolic dysfunction, left ventricle    EF is 43% per Myoview ; dyssynchrony of the septum noted on echo; has LBBB   Family History  Problem Relation Age of Onset   Hyperlipidemia Mother    Hyperlipidemia Brother  Hyperlipidemia Brother    Colon cancer Neg Hx    Colon polyps Neg Hx    Esophageal cancer Neg Hx    Rectal cancer Neg Hx    Stomach cancer Neg Hx    Past Surgical History:  Procedure Laterality Date   CHOLECYSTECTOMY  01/31/2007   CYSTOSCOPY WITH RETROGRADE PYELOGRAM, URETEROSCOPY AND STENT PLACEMENT  03/06/2012   Procedure: CYSTOSCOPY WITH RETROGRADE PYELOGRAM, URETEROSCOPY AND STENT PLACEMENT;  Surgeon: Toribio Neysa Repine, MD;  Location: [facility];  Service: Urology;  Laterality: Left;   CYSTOSCOPY WITH RETROGRADE PYELOGRAM, URETEROSCOPY AND  STENT PLACEMENT Left 03/20/2012   Procedure: CYSTOSCOPY WITH RETROGRADE PYELOGRAM, URETEROSCOPY AND STENT PLACEMENT;  Surgeon: Toribio Neysa Repine, MD;  Location: [facility];  Service: Urology;  Laterality: Left;  balloon dilitation   CYSTOSCOPY/URETEROSCOPY/HOLMIUM LASER/STENT PLACEMENT Left 03/08/2018   Procedure: CYSTOSCOPY/RETROGRADE/URETEROSCOPY/STENT PLACEMENT;  Surgeon: Devere Lonni Righter, MD;  Location: [facility];  Service: Urology;  Laterality: Left;   EXTRACORPOREAL SHOCK WAVE LITHOTRIPSY Left 01/24/2018   Procedure: EXTRACORPOREAL SHOCK WAVE LITHOTRIPSY (ESWL);  Surgeon: Alvaro Hummer, MD;  Location: [facility];  Service: Urology;  Laterality: Left;   EXTRACORPOREAL SHOCK WAVE LITHOTRIPSY Right 02/07/2021   Procedure: EXTRACORPOREAL SHOCK WAVE LITHOTRIPSY (ESWL);  Surgeon: Carolee Sherwood JONETTA DOUGLAS, MD;  Location: [facility];  Service: Urology;  Laterality: Right;   FOOT SURGERY  12/30/2005   and 2022 - plantar fascitits   HOLMIUM LASER APPLICATION Left 03/20/2012   Procedure: HOLMIUM LASER APPLICATION;  Surgeon: Toribio Neysa Repine, MD;  Location: [facility];  Service: Urology;  Laterality: Left;   TONSILLECTOMY  03/02/2010   WRIST SURGERY  11/30/2001   Social History   Occupational History   Occupation: [employer]  Tobacco Use   Smoking status: Former    Current packs/day: 0.00    Types: Cigarettes    Quit date: 01/31/1991    Years since quitting: 33.0   Smokeless tobacco: Never  Vaping Use   Vaping status: Never Used  Substance and Sexual Activity   Alcohol  use: Yes    Comment: occ   Drug use: No   Sexual activity: Not on file   "

## 2024-02-12 ENCOUNTER — Ambulatory Visit: Admitting: Psychology

## 2024-02-12 ENCOUNTER — Encounter: Payer: Self-pay | Admitting: Sports Medicine

## 2024-02-12 DIAGNOSIS — F4323 Adjustment disorder with mixed anxiety and depressed mood: Secondary | ICD-10-CM | POA: Diagnosis not present

## 2024-02-12 DIAGNOSIS — Z639 Problem related to primary support group, unspecified: Secondary | ICD-10-CM

## 2024-02-13 NOTE — Progress Notes (Signed)
 " [name] Counselor/Therapist Progress [name]  Patient ID: Kendra [name], MRN: [id number],    Date: 02/12/2024  Time Spent: 60 minutes Time in:4:00 Time out:5:00  Treatment Type: Family with patient  Reported Symptoms: sadness, conflict  Mental Status Exam: Appearance:  Casual     Behavior: Appropriate  Motor: Normal  Speech/Language:  Clear and Coherent  Affect: Appropriate  Mood: normal  Thought process: normal  Thought content:   WNL  Sensory/Perceptual disturbances:   WNL  Orientation: oriented to person, place, time/date, and situation  Attention: Good  Concentration: Good  Memory: WNL  Fund of knowledge:  Good  Insight:   Good  Judgment:  Good  Impulse Control: Good   Risk Assessment: Danger to Self:  No Self-injurious Behavior: No Danger to Others: No Duty to Warn:no Physical Aggression / Violence:No  Access to Firearms a concern: No  Gang Involvement:No   Subjective: Kendra patient attended an individual therapy session in Kendra office today.  Her partner was not able to make it today because he was sick at home.  Today we talked about how they are doing and she feels that they are doing very well and that they are happy that they came for couples counseling.  She states that they had Kendra conversation with her children and that went well and they seem to be doing with Kendra results of that.  We also talked about how she is feeling about not getting Kendra engagement ring that she bought several years ago.  We talked about Kendra possibility that he may not quite be ready to do anything about that just yet because he wants them to be by themselves for a while.  I also explained to her that I thought that maybe he was fearful that he would fail in Kendra marriage again and that he needed to work through that in his own therapy.  I recommended that she think about what it is that she wants to get something committed marriage relationship and that she may want to at some  point during our sessions released him from having to feel like he is to her and we talked about how to presents at formation to him.  I think Kendra reason that she wants to be married is that she just wants that level of commitment.  I recommended that she tell him that she wants that and why that is important to her.  I also told her that if he after couple years had not propose then that she may want to revisit that and reevaluate what it is that she wants from Kendra circumstance. Interventions: Cognitive Behavioral Therapy, Assertiveness/Communication, Family Systems, and Interpersonal  Diagnosis:Adjustment disorder with mixed anxiety and depressed mood  Relationship problems  Plan: Plan of Care: Treatment Plan Client Abilities/Strengths Intelligent, strong marriage, good insight and judgment Client Treatment Preferences Marriage counseling Client Statement of Needs We want to work on helping our relationship be strong Treatment Level Outpatient couples therapy Symptoms  Difficulty resolving problems.: (Status: maintained).  Infrequent communication with partner regarding intimate matters.:  (Status: maintained ) (Status: maintained). Problems Addressed Communication, Communication, Loss of Love/Affection, Depression Due to Relationship Problems  Goals 1. Each partner listens to and understands Kendra other partner's perspective. 2. Each partner notices and verbalizes appreciation to Kendra other for acts of kindness, thoughtfulness, and caring.    Target Date: 10/24/2024 Frequency: weekly to bi weekly Progress: 60 Modality: individual Objective Verbalize realistic, attainable time goals for expression of feelings of  affection, and intimacy. Target Date: 10/24/2024 Frequency: weekly to bi weekly Progress: 50 Modality: couples Related Interventions 1. Have Kendra partners provide feedback to each other about specific actions that will help build mutual trust. 2. Reinforce attempts by  clients to be verbally supportive of each other. 3. Obtain an agreement from Kendra partners regarding specific helpful, thoughtful gestures each will perform during Kendra week . 4. Teach Kendra partners to agree to their mutual satisfaction that a problem has been correctly pinpointed before actually trying to solve Kendra problem; have them practice this in session on areas of conflict. Objective Commit to moving forward and to letting go of past issues. Target Date: 10/24/2024 Frequency: weekly to bi weekly Progress: 70 Modality: couples Related Interventions 1. Obtain a verbal commitment between Kendra partners and Kendra therapist to attempt to move forward and to put past issues behind them, at least for a specified period. 2. Teach Kendra partners to maintain eye contact with each other while speaking and listening. Objective Increase eye contact and communication with Kendra partner. Target Date: 10/24/2024 Frequency: weekly to bi weekly Progress: 50 Modality: individual Related Interventions 1. Provide feedback to Kendra partners about Kendra extent to which each maintains eye contact with Kendra other and communicates needs.  2. Obtain a commitment from Kendra partners to minimize critical and hostile comments toward each other (or assign Alternatives to Destructive Anger in Kendra Adult Psychotherapy Homework Planner, 2nd ed. by Jenniffer). Objective Identify activities that self and Kendra partner would enjoy together. Target Date: 10/24/2024 Frequency: weekly to bi weekly Progress: 0 Modality: couples 6. Increase time spent together in social and recreational activities. 7. Partners discuss and resolve problems effectively without verbal fighting.  Objective Practice problem-solving techniques within Kendra session. Target Date: 10/24/2024 Frequency: weekly to bi weekly Progress: 50 Modality: couples  Objective Make positive statements to self and others about Kendra future.  Target Date: 10/24/2024 Frequency:  weekly to biweekly Progress: 50 Modality: couples Objective Verbalize own level of commitment to remain in Kendra relationship. Target Date: 10/24/2024 Frequency: Weekly to biweekly Progress: 50 Modality: couples   Objective Describe overall level of satisfaction with Kendra relationship. Target Date: 10/24/2024 Frequency: weekly to biweekly Progress: 50 Modality: Couples  Related Interventions 1. Explore each partner's thoughts and feelings concerning Kendra relationship. Diagnosis  F43.23  Adjustment disorder with Mixed anxiety and depression Z63.9 (Relationship distress with spouse or intimate partner) - Open - [Signifier: n/a] Relationship Distress with Spouse or Intimate Partner Conditions For Discharge Achievement of treatment goals and objectives  Thelda Gagan G Gitty Osterlund, LCSW                                  "

## 2024-02-14 ENCOUNTER — Ambulatory Visit
Admission: RE | Admit: 2024-02-14 | Discharge: 2024-02-14 | Disposition: A | Source: Ambulatory Visit | Attending: Sports Medicine

## 2024-02-14 ENCOUNTER — Ambulatory Visit
Admission: RE | Admit: 2024-02-14 | Discharge: 2024-02-14 | Disposition: A | Discharge status: Home / Self Care | Source: Ambulatory Visit | Attending: Sports Medicine | Admitting: Sports Medicine

## 2024-02-14 DIAGNOSIS — G8929 Other chronic pain: Secondary | ICD-10-CM

## 2024-02-14 DIAGNOSIS — M5136 Other intervertebral disc degeneration, lumbar region with discogenic back pain only: Secondary | ICD-10-CM

## 2024-02-16 ENCOUNTER — Other Ambulatory Visit

## 2024-02-21 ENCOUNTER — Ambulatory Visit: Admitting: Sports Medicine

## 2024-02-21 ENCOUNTER — Ambulatory Visit: Payer: Self-pay

## 2024-02-21 ENCOUNTER — Encounter: Payer: Self-pay | Admitting: Sports Medicine

## 2024-02-21 ENCOUNTER — Ambulatory Visit: Admitting: Psychology

## 2024-02-21 DIAGNOSIS — M87051 Idiopathic aseptic necrosis of right femur: Secondary | ICD-10-CM

## 2024-02-21 DIAGNOSIS — M25551 Pain in right hip: Secondary | ICD-10-CM

## 2024-02-21 DIAGNOSIS — F4323 Adjustment disorder with mixed anxiety and depressed mood: Secondary | ICD-10-CM | POA: Diagnosis not present

## 2024-02-21 DIAGNOSIS — Z639 Problem related to primary support group, unspecified: Secondary | ICD-10-CM

## 2024-02-21 DIAGNOSIS — M87052 Idiopathic aseptic necrosis of left femur: Secondary | ICD-10-CM | POA: Diagnosis not present

## 2024-02-21 DIAGNOSIS — M533 Sacrococcygeal disorders, not elsewhere classified: Secondary | ICD-10-CM | POA: Diagnosis not present

## 2024-02-21 DIAGNOSIS — G8929 Other chronic pain: Secondary | ICD-10-CM | POA: Diagnosis not present

## 2024-02-21 MED ORDER — LIDOCAINE HCL 1 % IJ SOLN
4.0000 mL | INTRAMUSCULAR | Status: AC | PRN
  Administered 2024-02-21: 4 mL

## 2024-02-21 MED ORDER — METHYLPREDNISOLONE ACETATE 40 MG/ML IJ SUSP
60.0000 mg | INTRAMUSCULAR | Status: AC | PRN
  Administered 2024-02-21: 60 mg via INTRA_ARTICULAR

## 2024-02-21 MED ORDER — MELOXICAM 15 MG PO TABS: 15.0000 mg | ORAL_TABLET | Freq: Every day | ORAL | 0 refills | Status: AC

## 2024-02-21 NOTE — Progress Notes (Signed)
 " [facility] Counselor/Therapist Progress Note  Patient ID: Kendra [name], MRN: [id number],    Date: 02/21/2024  Time Spent: 60 minutes Time in:4:00 Time out:5:00  Treatment Type: Family with patient  Reported Symptoms: sadness, conflict  Mental Status Exam: Appearance:  Casual     Behavior: Appropriate  Motor: Normal  Speech/Language:  Clear and Coherent  Affect: Appropriate  Mood: normal  Thought process: normal  Thought content:   WNL  Sensory/Perceptual disturbances:   WNL  Orientation: oriented to person, place, time/date, and situation  Attention: Good  Concentration: Good  Memory: WNL  Fund of knowledge:  Good  Insight:   Good  Judgment:  Good  Impulse Control: Good   Risk Assessment: Danger to Self:  No Self-injurious Behavior: No Danger to Others: No Duty to Warn:no Physical Aggression / Violence:No  Access to Firearms a concern: No  Gang Involvement:No   Subjective: The patient attended an couples therapy session in the office today.  The patient and her partner present as pleasant and cooperative.  Today we decided to discuss specific incidents that have happened since I saw them both last where they were not communicating well.  We did some role-playing and talked more about how to communicate things differently and I pointed out how what each of them could have said to the other could have been perceived as sarcastic or defensive.  They both owned their part in it and it seems like when they left they were in a much better place.  I encouraged them to use their time out word to by themselves sometimes so that they can really analyze a scenario so that they can think about how to say things differently.  Interventions: Cognitive Behavioral Therapy, Assertiveness/Communication, Family Systems, and Interpersonal  Diagnosis:Adjustment disorder with mixed anxiety and depressed mood  Relationship problems  Plan: Plan of Care: Treatment  Plan Client Abilities/Strengths Intelligent, strong marriage, good insight and judgment Client Treatment Preferences Marriage counseling Client Statement of Needs We want to work on helping our relationship be strong Treatment Level Outpatient couples therapy Symptoms  Difficulty resolving problems.: (Status: maintained).  Infrequent communication with partner regarding intimate matters.:  (Status: maintained ) (Status: maintained). Problems Addressed Communication, Communication, Loss of Love/Affection, Depression Due to Relationship Problems  Goals 1. Each partner listens to and understands the other partner's perspective. 2. Each partner notices and verbalizes appreciation to the other for acts of kindness, thoughtfulness, and caring.    Target Date: 10/24/2024 Frequency: weekly to bi weekly Progress: 60 Modality: individual Objective Verbalize realistic, attainable time goals for expression of feelings of affection, and intimacy. Target Date: 10/24/2024 Frequency: weekly to bi weekly Progress: 50 Modality: couples Related Interventions 1. Have the partners provide feedback to each other about specific actions that will help build mutual trust. 2. Reinforce attempts by clients to be verbally supportive of each other. 3. Obtain an agreement from the partners regarding specific helpful, thoughtful gestures each will perform during the week . 4. Teach the partners to agree to their mutual satisfaction that a problem has been correctly pinpointed before actually trying to solve the problem; have them practice this in session on areas of conflict. Objective Commit to moving forward and to letting go of past issues. Target Date: 10/24/2024 Frequency: weekly to bi weekly Progress: 70 Modality: couples Related Interventions 1. Obtain a verbal commitment between the partners and the therapist to attempt to move forward and to put past issues behind them, at least for a  specified  period. 2. Teach the partners to maintain eye contact with each other while speaking and listening. Objective Increase eye contact and communication with the partner. Target Date: 10/24/2024 Frequency: weekly to bi weekly Progress: 50 Modality: individual Related Interventions 1. Provide feedback to the partners about the extent to which each maintains eye contact with the other and communicates needs.  2. Obtain a commitment from the partners to minimize critical and hostile comments toward each other (or assign Alternatives to Destructive Anger in the Adult Psychotherapy Homework Planner, 2nd ed. by Jenniffer). Objective Identify activities that self and the partner would enjoy together. Target Date: 10/24/2024 Frequency: weekly to bi weekly Progress: 0 Modality: couples 6. Increase time spent together in social and recreational activities. 7. Partners discuss and resolve problems effectively without verbal fighting.  Objective Practice problem-solving techniques within the session. Target Date: 10/24/2024 Frequency: weekly to bi weekly Progress: 50 Modality: couples  Objective Make positive statements to self and others about the future.  Target Date: 10/24/2024 Frequency: weekly to biweekly Progress: 50 Modality: couples Objective Verbalize own level of commitment to remain in the relationship. Target Date: 10/24/2024 Frequency: Weekly to biweekly Progress: 50 Modality: couples   Objective Describe overall level of satisfaction with the relationship. Target Date: 10/24/2024 Frequency: weekly to biweekly Progress: 50 Modality: Couples  Related Interventions 1. Explore each partner's thoughts and feelings concerning the relationship. Diagnosis  F43.23  Adjustment disorder with Mixed anxiety and depression Z63.9 (Relationship distress with spouse or intimate partner) - Open - [Signifier: n/a] Relationship Distress with Spouse or Intimate Partner Conditions  For Discharge Achievement of treatment goals and objectives  Serenna Deroy G Linn Clavin, LCSW                                  "

## 2024-02-21 NOTE — Progress Notes (Signed)
 "  Kendra [name] - 54 y.o. female MRN [id number]  Date of birth: July 18, 1970  Office Visit Note: Visit Date: 02/21/2024 PCP: Stephane Leita DEL, MD Referred by: Stephane Leita DEL, MD  Subjective: Chief Complaint  Patient presents with   Lower Back - Follow-up   HPI: Kendra [name] is a pleasant 54 y.o. female who presents today for f/u of chronic bilateral hip/pelvis pain, also for MRI reviews.  Discussed the use of AI scribe software for clinical note transcription with the patient, who gave verbal consent to proceed.  History of Present Illness Kendra [name] is a 54 year old female with early avascular necrosis of bilateral hips who presents for evaluation of persistent right hip and lower back pain.  Pain is mainly in the right groin and right lower buttock with radiation to the right lower back. It has shifted locations over the past several weeks but is now most prominent in the right groin and right lower back. The left hip is asymptomatic. She describes intermittent crunching or abnormal sensation in the right hip during stretching, especially with attempts to increase range of motion.  She had a right sacroiliac joint injection that gave significant relief for about two weeks before pain recurred, with somewhat unclear pain patterns since. She is not doing heavy physical activity.  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   She specifically denies significant hip trauma, heavy alcohol  use, current tobacco use (smoked socially only for 6 or 7 years, quit many years ago), chronic lung disease, autoimmune disease, frequent steroid use, or prior radiation or chemotherapy.   Pertinent ROS were reviewed with the patient and found to be negative unless otherwise specified above in HPI.   Assessment & Plan: Visit Diagnoses:  1. Chronic right hip pain   2. Chronic right SI joint pain   3. Avascular necrosis of bone of hip, right (HCC)   4.  Avascular necrosis of bone of left hip (HCC)    A/P Summary: - if significant relief from IA-hip inj --> treating as tolerated, could consider referral to Dr. Jerri for discussion on THA - if only partial relief or short-lived --> treating SI-joint and pelvis/soft tissue Assessment & Plan Avascular necrosis of bilateral hips Early avascular necrosis in both hips, more pronounced radiographically on the left, but more symptomatic on the right. Disease is mild without advanced collapse or severe arthritis. Discussed natural history, progression risk, and potential future hip arthroplasty. Consulted hip surgeon for management options. - Reviewed imaging and findings with her with explanation of AVN - Performed diagnostic and therapeutic right hip intra-articular injection to clarify pain source and provide relief. - Recommended conservative management and clinical monitoring.  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 Symptoms align more with hip pathology. - Reviewed prior transient improvement post-SI joint injection. - Recommended prioritizing hip-directed interventions. - refilled meloxicam  15mg  --> take daily prior to trips and short-term 3-4 day bursts as needed  Chronic bilateral hip pain Chronic bilateral hip pain, more severe on the right with mechanical symptoms. Pain primarily due to early avascular necrosis, not SI joint or lumbar spine. Left hip less symptomatic despite greater radiographic involvement. - Performed diagnostic and therapeutic right hip intra-articular injection to clarify pain source and provide relief. - Discussed that positive response to hip injection would confirm hip as primary pain source. - Through shared  decision making, did proceed with ultrasound-guided right hip intra-articular corticosteroid injection for both  diagnostic and therapeutic purposes.  Postinjection protocol. - I would like her to send me a MyChart message with an update in about 10 days to see what degree of relief (0-100%) and location of improvement she receives from the injection today.  - refilled meloxicam  15mg  --> take daily prior to trips and short-term 3-4 day bursts as needed  Follow-up: Return for will messsage me update of relief (0-100%) in 10 days from inj.   Meds & Orders:  Meds ordered this encounter  Medications   meloxicam  (MOBIC ) 15 MG tablet    Sig: Take 1 tablet (15 mg total) by mouth daily.    Dispense:  30 tablet    Refill:  0    Orders Placed This Encounter  Procedures   Large Joint Inj   US  Guided Needle Placement - No Linked Charges     Procedures: Large Joint Inj: R hip joint on 02/21/2024 9:32 AM Indications: pain and diagnostic evaluation Details: 22 G 3.5 in needle, ultrasound-guided anterior approach Medications: 4 mL lidocaine  1 %; 60 mg methylPREDNISolone  acetate 40 MG/ML Outcome: tolerated well, no immediate complications  Procedure: US -guided intra-articular hip injection, Right After discussion on risks/benefits/indications and informed verbal consent was obtained, a timeout was performed. Patient was lying supine on exam table. The hip was cleaned with betadine and alcohol  swabs. Then utilizing ultrasound guidance, the patient's femoral head and neck junction was identified and subsequently injected with 4:1.5 lidocaine :depomedrol via an in-plane approach with ultrasound visualization of the injectate administered into the hip joint. Patient tolerated procedure well without immediate complications.  Procedure, treatment alternatives, risks and benefits explained, specific risks discussed. Consent was given by the patient. Immediately prior to procedure a time out was called to verify the correct patient, procedure, equipment, support staff and site/side marked as required. Patient was prepped  and draped in the usual sterile fashion.          Clinical History: No specialty comments available.  She reports that she quit smoking about 33 years ago. Her smoking use included cigarettes. She has never used smokeless tobacco. No results for input(s): HGBA1C, LABURIC in the last 8760 hours.  Objective:    Physical Exam  Gen: Well-appearing, in no acute distress; non-toxic CV: Well-perfused. Warm.  Resp: Breathing unlabored on room air; no wheezing. Psych: Fluid speech in conversation; appropriate affect; normal thought process  *MSK/Ortho Exam: Physical Exam MUSCULOSKELETAL: Spine normal. Hip rotation normal. Hip abduction normal. Hip flexion normal. Right hip tenderness and pain on rotation. Groin area pain.  *Bilateral hips/pelvis: + TTP over the right SI joint region, minimal on the left.  There is tenderness over the right greater than left posterior hip/upper buttock.  There is fluid logroll with internal and external rotation.  Positive Stinchfield on the right, negative on the left.  Negative FADIR test bilaterally.  Good hip abduction strength testing bilaterally.  Negative Gaenslen's test, positive Fortin's point test on the right.  Imaging:  *I did independently review the pelvis and lumbar spine MRIs from 02/14/2024 during the visit today.  MR Pelvis w/o contrast MR PELVIS WITHOUT IV CONTRAST  COMPARISON: MRI pelvis 06/23/2019  CLINICAL HISTORY: Pelvic pain and SI joint pain.  PULSE SEQUENCES: AX T1, Ax T2 FS, Cor T1, COR STIR & SMALL FOV COR PD FS without contrast.  FINDINGS:  Bones: The pelvic ring is intact. There is no fracture or marrow replacing process. The hips  are unremarkable. The sacrum is unremarkable. There is trace edema in the left SI joint is relatively stable compared with remote examination likely of no clinical significance. No erosive changes or widening. Mild serpiginous abnormal signal is seen in bilateral central femoral  head likely reflecting avascular necrosis. There is no evidence to suggest subchondral collapse. Small reactive joint effusions are present.  Uterine fibroid is again identified in the posterior uterine wall. This has increased in size compared the prior examination. Intrauterine device is partially imaged.  Musculotendinous structures: Mild insertional tendinosis of the gluteus medius and minimus tendons. Otherwise, no significant abnormality.  IMPRESSION: Stable minimal fluid signal in the left SI joint when compared with remote examination likely of no clinical significance. Otherwise, unremarkable MRI of the pelvis and sacrum.  Findings of mild avascular necrosis of the right and left femoral heads without evidence of subchondral collapse. This is new when compared with the prior examination. Trace joint effusions are present.  No significant musculotendinous abnormality.  Electronically signed by: Norleen Satchel MD 02/14/2024 04:16 PM EST RP Workstation: [id number] MR Lumbar Spine w/o contrast MR LUMBAR SPINE WITHOUT IV CONTRAST  COMPARISON: None available  CLINICAL HISTORY: Low back pain.  TECHNIQUE: SAG T2, SAG T1, SAG STIR, AX T2, AX T1 without IV contrast.  FINDINGS: There is normal alignment of the lumbar spine. There is no vertebral body height loss, subluxation or marrow replacing process. The sacrum and SI joints are unremarkable so far as visualized. Conus and cauda equina are unremarkable.  T12-L1: There is no focal disc protrusion, foraminal or spinal stenosis.  L1-2: There is no focal disc protrusion, foraminal or spinal stenosis.  L2-3: There is no focal disc protrusion, foraminal or spinal stenosis.  L3-4: There is no focal disc protrusion, foraminal or spinal stenosis.  L4-5: Mild broad-based disc and mild-to-moderate moderate facet arthrosis. There is no significant foraminal or spinal stenosis.  L5-S1: Mild disc desiccation facet arthrosis. No  significant foraminal or spinal stenosis.  The retroperitoneal structures demonstrate no significant abnormality.  IMPRESSION: Mild facet arthrosis and minimal disc desiccation most notably at L4-5. There is no significant foraminal or spinal stenosis. No acute abnormality.  Electronically signed by: Norleen Satchel MD 02/14/2024 04:11 PM EST RP Workstation: [id number]  Past Medical/Family/Surgical/Social History: Medications & Allergies reviewed per EMR, new medications updated. 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   Anal skin tag 03/11/2019   COVID-19 12/17/2018   Eustachian tube dysfunction 03/10/2015   Acute bronchitis 04/28/2014   Diarrhea 11/18/2013   Right flank pain 11/18/2013   RLQ abdominal pain 11/18/2013   Routine general medical examination at a health care facility 08/06/2013   Palpitations 04/22/2013   Chronic systolic congestive heart failure (HCC) 08/16/2011   Left bundle branch block 05/08/2011   Pleuritic chest pain 04/07/2011   Lower urinary tract infectious disease 03/15/2011   Ureteral stone 03/15/2011   Screening for HIV (human immunodeficiency virus) 09/16/2010   WEIGHT LOSS-ABNORMAL 01/11/2009   DEPRESSION 10/20/2008   HYPERLIPIDEMIA 06/22/2007   HEADACHE, CHRONIC 06/22/2007   NEPHROLITHIASIS, HX OF 06/22/2007   Hyperlipidemia 06/22/2007   ECZEMA 03/25/2007   Past Medical History:  Diagnosis Date  Allergy    Chronic headache    Former smoker    quit in 89   GERD (gastroesophageal reflux disease)    History of kidney stones    Hyperlipidemia    Left bundle branch block    Obesity    Systolic dysfunction, left ventricle    EF  is 43% per Myoview ; dyssynchrony of the septum noted on echo; has LBBB   Family History  Problem Relation Age of Onset   Hyperlipidemia Mother    Hyperlipidemia Brother    Hyperlipidemia Brother    Colon cancer Neg Hx    Colon polyps Neg Hx    Esophageal cancer Neg Hx    Rectal cancer Neg Hx    Stomach cancer Neg Hx    Past Surgical History:  Procedure Laterality Date   CHOLECYSTECTOMY  01/31/2007   CYSTOSCOPY WITH RETROGRADE PYELOGRAM, URETEROSCOPY AND STENT PLACEMENT  03/06/2012   Procedure: CYSTOSCOPY WITH RETROGRADE PYELOGRAM, URETEROSCOPY AND STENT PLACEMENT;  Surgeon: Toribio Neysa Repine, MD;  Location: [facility];  Service: Urology;  Laterality: Left;   CYSTOSCOPY WITH RETROGRADE PYELOGRAM, URETEROSCOPY AND STENT PLACEMENT Left 03/20/2012   Procedure: CYSTOSCOPY WITH RETROGRADE PYELOGRAM, URETEROSCOPY AND STENT PLACEMENT;  Surgeon: Toribio Neysa Repine, MD;  Location: [facility];  Service: Urology;  Laterality: Left;  balloon dilitation   CYSTOSCOPY/URETEROSCOPY/HOLMIUM LASER/STENT PLACEMENT Left 03/08/2018   Procedure: CYSTOSCOPY/RETROGRADE/URETEROSCOPY/STENT PLACEMENT;  Surgeon: Devere Lonni Righter, MD;  Location: [facility];  Service: Urology;  Laterality: Left;   EXTRACORPOREAL SHOCK WAVE LITHOTRIPSY Left 01/24/2018   Procedure: EXTRACORPOREAL SHOCK WAVE LITHOTRIPSY (ESWL);  Surgeon: Alvaro Hummer, MD;  Location: [facility];  Service: Urology;  Laterality: Left;   EXTRACORPOREAL SHOCK WAVE LITHOTRIPSY Right 02/07/2021   Procedure: EXTRACORPOREAL SHOCK WAVE LITHOTRIPSY (ESWL);  Surgeon: Carolee Sherwood JONETTA DOUGLAS, MD;  Location: [facility];  Service: Urology;  Laterality: Right;   FOOT SURGERY  12/30/2005   and 2022 - plantar fascitits   HOLMIUM LASER APPLICATION Left 03/20/2012   Procedure: HOLMIUM LASER APPLICATION;  Surgeon: Toribio Neysa Repine, MD;  Location: [facility];  Service: Urology;  Laterality: Left;    TONSILLECTOMY  03/02/2010   WRIST SURGERY  11/30/2001   Social History   Occupational History   Occupation: [employer]  Tobacco Use   Smoking status: Former    Current packs/day: 0.00    Types: Cigarettes    Quit date: 01/31/1991    Years since quitting: 33.0   Smokeless tobacco: Never  Vaping Use   Vaping status: Never Used  Substance and Sexual Activity   Alcohol  use: Yes    Comment: occ   Drug use: No   Sexual activity: Not on file   "

## 2024-02-25 ENCOUNTER — Ambulatory Visit: Admitting: Sports Medicine

## 2024-03-05 ENCOUNTER — Ambulatory Visit: Admitting: Psychology

## 2024-03-05 ENCOUNTER — Telehealth: Payer: Self-pay | Admitting: Sports Medicine

## 2024-03-05 ENCOUNTER — Other Ambulatory Visit: Payer: Self-pay | Admitting: Sports Medicine

## 2024-03-05 DIAGNOSIS — Z639 Problem related to primary support group, unspecified: Secondary | ICD-10-CM

## 2024-03-05 DIAGNOSIS — M87052 Idiopathic aseptic necrosis of left femur: Secondary | ICD-10-CM

## 2024-03-05 DIAGNOSIS — M87051 Idiopathic aseptic necrosis of right femur: Secondary | ICD-10-CM

## 2024-03-05 DIAGNOSIS — F4323 Adjustment disorder with mixed anxiety and depressed mood: Secondary | ICD-10-CM

## 2024-03-05 NOTE — Telephone Encounter (Signed)
 Patient checking the status of a message she send on 03/03/24 but has not heard back from Dr Burnetta

## 2024-03-06 NOTE — Progress Notes (Signed)
 " [facility] Counselor/Therapist Progress Note  Patient ID: Kendra [name], MRN: [id number],    Date: 03/05/2024  Time Spent: 60 minutes Time in:4:00 Time out:5:00  Treatment Type: Family with patient  Reported Symptoms: sadness, conflict  Mental Status Exam: Appearance:  Casual     Behavior: Appropriate  Motor: Normal  Speech/Language:  Clear and Coherent  Affect: Appropriate  Mood: normal  Thought process: normal  Thought content:   WNL  Sensory/Perceptual disturbances:   WNL  Orientation: oriented to person, place, time/date, and situation  Attention: Good  Concentration: Good  Memory: WNL  Fund of knowledge:  Good  Insight:   Good  Judgment:  Good  Impulse Control: Good   Risk Assessment: Danger to Self:  No Self-injurious Behavior: No Danger to Others: No Duty to Warn:no Physical Aggression / Violence:No  Access to Firearms a concern: No  Gang Involvement:No   Subjective: The patient attended an couples therapy session in the office today.  The patient and her partner present as pleasant and cooperative.  The patient and her partner report that things have gone well over the last few weeks.  She states that they went to [place] and had a wonderful time.  They did get [place] up there and all went well there. There was 1 incident that happened recently with their dog and we process and discussed that and how that could have been done differently.  The good news is is that her partner was able to utilize some of the skills that we have talked about and pull himself away from the situation and not get defensive.  She intern went and circle back around and they had a discussion about it and it seemed to go better and not get as heated because they use the tools they have learned.  We talked about the need for them to definitely look at the skills that they have acquired and I think we will use some flashcards the next time and reiterate the communication  skills they have. Interventions: Cognitive Behavioral Therapy, Assertiveness/Communication, Family Systems, and Interpersonal  Diagnosis:Adjustment disorder with mixed anxiety and depressed mood  Relationship problems  Plan: Plan of Care: Treatment Plan Client Abilities/Strengths Intelligent, strong marriage, good insight and judgment Client Treatment Preferences Marriage counseling Client Statement of Needs We want to work on helping our relationship be strong Treatment Level Outpatient couples therapy Symptoms  Difficulty resolving problems.: (Status: maintained).  Infrequent communication with partner regarding intimate matters.:  (Status: maintained ) (Status: maintained). Problems Addressed Communication, Communication, Loss of Love/Affection, Depression Due to Relationship Problems  Goals 1. Each partner listens to and understands the other partner's perspective. 2. Each partner notices and verbalizes appreciation to the other for acts of kindness, thoughtfulness, and caring.    Target Date: 10/24/2024 Frequency: weekly to bi weekly Progress: 70 Modality: individual Objective Verbalize realistic, attainable time goals for expression of feelings of affection, and intimacy. Target Date: 10/24/2024 Frequency: weekly to bi weekly Progress: 60 Modality: couples Related Interventions 1. Have the partners provide feedback to each other about specific actions that will help build mutual trust. 2. Reinforce attempts by clients to be verbally supportive of each other. 3. Obtain an agreement from the partners regarding specific helpful, thoughtful gestures each will perform during the week . 4. Teach the partners to agree to their mutual satisfaction that a problem has been correctly pinpointed before actually trying to solve the problem; have them practice this in session on areas of conflict. Objective Commit  to moving forward and to letting go of past issues. Target  Date: 10/24/2024 Frequency: weekly to bi weekly Progress: 80 Modality: couples Related Interventions 1. Obtain a verbal commitment between the partners and the therapist to attempt to move forward and to put past issues behind them, at least for a specified period. 2. Teach the partners to maintain eye contact with each other while speaking and listening. Objective Increase eye contact and communication with the partner. Target Date: 10/24/2024 Frequency: weekly to bi weekly Progress: 60 Modality: individual Related Interventions 1. Provide feedback to the partners about the extent to which each maintains eye contact with the other and communicates needs.  2. Obtain a commitment from the partners to minimize critical and hostile comments toward each other (or assign Alternatives to Destructive Anger in the Adult Psychotherapy Homework Planner, 2nd ed. by Jenniffer). Objective Identify activities that self and the partner would enjoy together. Target Date: 10/24/2024 Frequency: weekly to bi weekly Progress: 0 Modality: couples 6. Increase time spent together in social and recreational activities. 7. Partners discuss and resolve problems effectively without verbal fighting.  Objective Practice problem-solving techniques within the session. Target Date: 10/24/2024 Frequency: weekly to bi weekly Progress: 50 Modality: couples  Objective Make positive statements to self and others about the future.  Target Date: 10/24/2024 Frequency: weekly to biweekly Progress: 50 Modality: couples Objective Verbalize own level of commitment to remain in the relationship. Target Date: 10/24/2024 Frequency: Weekly to biweekly Progress: 50 Modality: couples   Objective Describe overall level of satisfaction with the relationship. Target Date: 10/24/2024 Frequency: weekly to biweekly Progress: 50 Modality: Couples  Related Interventions 1. Explore each partner's thoughts and feelings concerning  the relationship. Diagnosis  F43.23  Adjustment disorder with Mixed anxiety and depression Z63.9 (Relationship distress with spouse or intimate partner) - Open - [Signifier: n/a] Relationship Distress with Spouse or Intimate Partner Conditions For Discharge Achievement of treatment goals and objectives  Kendra Simonich G Luetta Piazza, LCSW                                  "

## 2024-03-12 ENCOUNTER — Ambulatory Visit: Admitting: Psychology

## 2024-03-18 ENCOUNTER — Encounter: Admitting: Orthopaedic Surgery

## 2024-03-20 ENCOUNTER — Ambulatory Visit: Admitting: Psychology

## 2024-03-26 ENCOUNTER — Ambulatory Visit: Admitting: Psychology

## 2024-04-02 ENCOUNTER — Ambulatory Visit: Admitting: Psychology

## 2024-04-09 ENCOUNTER — Ambulatory Visit: Admitting: Psychology

## 2024-04-17 ENCOUNTER — Ambulatory Visit: Admitting: Psychology

## 2024-04-23 ENCOUNTER — Ambulatory Visit: Admitting: Psychology
